# Patient Record
Sex: Female | Born: 1960 | Race: White | Hispanic: No | Smoking: Current every day smoker
Health system: Southern US, Community
[De-identification: ages and names within clinical notes are randomized; demographics above are authoritative.]

## PROBLEM LIST (undated history)

## (undated) DIAGNOSIS — F419 Anxiety disorder, unspecified: Secondary | ICD-10-CM

## (undated) DIAGNOSIS — I10 Essential (primary) hypertension: Secondary | ICD-10-CM

## (undated) DIAGNOSIS — F32A Depression, unspecified: Secondary | ICD-10-CM

## (undated) DIAGNOSIS — F329 Major depressive disorder, single episode, unspecified: Secondary | ICD-10-CM

## (undated) DIAGNOSIS — T7840XA Allergy, unspecified, initial encounter: Secondary | ICD-10-CM

## (undated) DIAGNOSIS — G35 Multiple sclerosis: Secondary | ICD-10-CM

## (undated) DIAGNOSIS — G709 Myoneural disorder, unspecified: Secondary | ICD-10-CM

## (undated) DIAGNOSIS — H539 Unspecified visual disturbance: Secondary | ICD-10-CM

## (undated) DIAGNOSIS — E785 Hyperlipidemia, unspecified: Secondary | ICD-10-CM

## (undated) HISTORY — DX: Allergy, unspecified, initial encounter: T78.40XA

## (undated) HISTORY — DX: Unspecified visual disturbance: H53.9

## (undated) HISTORY — DX: Major depressive disorder, single episode, unspecified: F32.9

## (undated) HISTORY — DX: Hyperlipidemia, unspecified: E78.5

## (undated) HISTORY — DX: Anxiety disorder, unspecified: F41.9

## (undated) HISTORY — PX: ABDOMINAL HYSTERECTOMY: SHX81

## (undated) HISTORY — DX: Myoneural disorder, unspecified: G70.9

## (undated) HISTORY — DX: Depression, unspecified: F32.A

## (undated) HISTORY — DX: Essential (primary) hypertension: I10

---

## 1997-04-03 HISTORY — PX: APPENDECTOMY: SHX54

## 1998-05-26 ENCOUNTER — Other Ambulatory Visit: Admission: RE | Admit: 1998-05-26 | Discharge: 1998-05-26 | Payer: Self-pay | Admitting: Obstetrics and Gynecology

## 1999-05-30 ENCOUNTER — Other Ambulatory Visit: Admission: RE | Admit: 1999-05-30 | Discharge: 1999-05-30 | Payer: Self-pay | Admitting: Obstetrics and Gynecology

## 2000-02-15 ENCOUNTER — Encounter: Payer: Self-pay | Admitting: Family Medicine

## 2000-02-15 ENCOUNTER — Encounter: Admission: RE | Admit: 2000-02-15 | Discharge: 2000-02-15 | Payer: Self-pay | Admitting: Family Medicine

## 2000-04-03 HISTORY — PX: TUBAL LIGATION: SHX77

## 2000-06-18 ENCOUNTER — Other Ambulatory Visit: Admission: RE | Admit: 2000-06-18 | Discharge: 2000-06-18 | Payer: Self-pay | Admitting: Obstetrics and Gynecology

## 2000-10-16 ENCOUNTER — Encounter: Payer: Self-pay | Admitting: Family Medicine

## 2000-10-16 ENCOUNTER — Encounter: Admission: RE | Admit: 2000-10-16 | Discharge: 2000-10-16 | Payer: Self-pay | Admitting: Family Medicine

## 2000-10-29 ENCOUNTER — Encounter: Payer: Self-pay | Admitting: Family Medicine

## 2000-10-29 ENCOUNTER — Encounter: Admission: RE | Admit: 2000-10-29 | Discharge: 2000-10-29 | Payer: Self-pay | Admitting: Family Medicine

## 2001-03-20 ENCOUNTER — Ambulatory Visit (HOSPITAL_COMMUNITY): Admission: RE | Admit: 2001-03-20 | Discharge: 2001-03-20 | Payer: Self-pay | Admitting: Obstetrics and Gynecology

## 2001-03-20 ENCOUNTER — Encounter (INDEPENDENT_AMBULATORY_CARE_PROVIDER_SITE_OTHER): Payer: Self-pay

## 2001-08-15 ENCOUNTER — Other Ambulatory Visit: Admission: RE | Admit: 2001-08-15 | Discharge: 2001-08-15 | Payer: Self-pay | Admitting: Obstetrics and Gynecology

## 2002-02-13 ENCOUNTER — Ambulatory Visit (HOSPITAL_COMMUNITY): Admission: RE | Admit: 2002-02-13 | Discharge: 2002-02-13 | Payer: Self-pay | Admitting: Obstetrics and Gynecology

## 2003-01-05 ENCOUNTER — Other Ambulatory Visit: Admission: RE | Admit: 2003-01-05 | Discharge: 2003-01-05 | Payer: Self-pay | Admitting: Obstetrics and Gynecology

## 2005-05-12 ENCOUNTER — Other Ambulatory Visit: Admission: RE | Admit: 2005-05-12 | Discharge: 2005-05-12 | Payer: Self-pay | Admitting: Obstetrics and Gynecology

## 2006-12-03 LAB — HM PAP SMEAR

## 2006-12-03 LAB — HM MAMMOGRAPHY: HM Mammogram: NORMAL

## 2007-03-04 HISTORY — PX: TOTAL VAGINAL HYSTERECTOMY: SHX2548

## 2007-03-13 ENCOUNTER — Ambulatory Visit (HOSPITAL_COMMUNITY): Admission: RE | Admit: 2007-03-13 | Discharge: 2007-03-14 | Payer: Self-pay | Admitting: Obstetrics and Gynecology

## 2007-03-13 ENCOUNTER — Encounter (INDEPENDENT_AMBULATORY_CARE_PROVIDER_SITE_OTHER): Payer: Self-pay | Admitting: Obstetrics and Gynecology

## 2007-09-25 ENCOUNTER — Ambulatory Visit: Payer: Self-pay | Admitting: Family Medicine

## 2007-09-25 DIAGNOSIS — I10 Essential (primary) hypertension: Secondary | ICD-10-CM | POA: Insufficient documentation

## 2007-09-25 DIAGNOSIS — E785 Hyperlipidemia, unspecified: Secondary | ICD-10-CM | POA: Insufficient documentation

## 2007-09-25 DIAGNOSIS — F339 Major depressive disorder, recurrent, unspecified: Secondary | ICD-10-CM | POA: Insufficient documentation

## 2007-09-26 LAB — CONVERTED CEMR LAB
ALT: 32 units/L (ref 0–35)
AST: 28 units/L (ref 0–37)
Albumin: 4.6 g/dL (ref 3.5–5.2)
Alkaline Phosphatase: 58 units/L (ref 39–117)
BUN: 12 mg/dL (ref 6–23)
CO2: 23 meq/L (ref 19–32)
Calcium: 9.6 mg/dL (ref 8.4–10.5)
Chloride: 102 meq/L (ref 96–112)
Cholesterol: 352 mg/dL — ABNORMAL HIGH (ref 0–200)
Creatinine, Ser: 0.76 mg/dL (ref 0.40–1.20)
Glucose, Bld: 97 mg/dL (ref 70–99)
HDL: 44 mg/dL (ref >39)
LDL Cholesterol: 262 mg/dL — ABNORMAL HIGH (ref 0–99)
Potassium: 4.6 meq/L (ref 3.5–5.3)
Sodium: 139 meq/L (ref 135–145)
Total Bilirubin: 0.4 mg/dL (ref 0.3–1.2)
Total CHOL/HDL Ratio: 8
Total Protein: 7.5 g/dL (ref 6.0–8.3)
Triglycerides: 232 mg/dL — ABNORMAL HIGH (ref <150)
VLDL: 46 mg/dL — ABNORMAL HIGH (ref 0–40)

## 2007-09-27 ENCOUNTER — Encounter: Payer: Self-pay | Admitting: Family Medicine

## 2007-11-11 ENCOUNTER — Telehealth: Payer: Self-pay | Admitting: Family Medicine

## 2007-11-22 DIAGNOSIS — G35D Multiple sclerosis, unspecified: Secondary | ICD-10-CM | POA: Insufficient documentation

## 2007-11-22 DIAGNOSIS — G35 Multiple sclerosis: Secondary | ICD-10-CM | POA: Insufficient documentation

## 2008-03-03 ENCOUNTER — Ambulatory Visit (HOSPITAL_COMMUNITY): Admission: RE | Admit: 2008-03-03 | Discharge: 2008-03-03 | Payer: Self-pay | Admitting: Obstetrics and Gynecology

## 2008-03-03 HISTORY — PX: ABDOMINAL HYSTERECTOMY: SHX81

## 2009-04-16 ENCOUNTER — Ambulatory Visit: Payer: Self-pay | Admitting: Family Medicine

## 2009-05-18 ENCOUNTER — Encounter: Payer: Self-pay | Admitting: Family Medicine

## 2009-05-20 LAB — CONVERTED CEMR LAB
ALT: 17 units/L (ref 0–35)
AST: 13 units/L (ref 0–37)
Albumin: 4.7 g/dL (ref 3.5–5.2)
Alkaline Phosphatase: 60 units/L (ref 39–117)
BUN: 17 mg/dL (ref 6–23)
CO2: 27 meq/L (ref 19–32)
Calcium: 9.8 mg/dL (ref 8.4–10.5)
Chloride: 102 meq/L (ref 96–112)
Cholesterol: 315 mg/dL — ABNORMAL HIGH (ref 0–200)
Creatinine, Ser: 0.62 mg/dL (ref 0.40–1.20)
Glucose, Bld: 116 mg/dL — ABNORMAL HIGH (ref 70–99)
HDL: 41 mg/dL (ref >39)
LDL Cholesterol: 239 mg/dL — ABNORMAL HIGH (ref 0–99)
Potassium: 4.3 meq/L (ref 3.5–5.3)
Sodium: 140 meq/L (ref 135–145)
TSH: 1.695 microintl units/mL (ref 0.350–4.500)
Total Bilirubin: 0.6 mg/dL (ref 0.3–1.2)
Total CHOL/HDL Ratio: 7.7
Total Protein: 7.5 g/dL (ref 6.0–8.3)
Triglycerides: 175 mg/dL — ABNORMAL HIGH (ref <150)
VLDL: 35 mg/dL (ref 0–40)

## 2009-05-21 ENCOUNTER — Ambulatory Visit: Payer: Self-pay | Admitting: Family Medicine

## 2009-05-21 DIAGNOSIS — E1129 Type 2 diabetes mellitus with other diabetic kidney complication: Secondary | ICD-10-CM | POA: Insufficient documentation

## 2009-05-21 DIAGNOSIS — R7301 Impaired fasting glucose: Secondary | ICD-10-CM | POA: Insufficient documentation

## 2009-05-21 DIAGNOSIS — E118 Type 2 diabetes mellitus with unspecified complications: Secondary | ICD-10-CM | POA: Insufficient documentation

## 2009-12-22 ENCOUNTER — Encounter: Payer: Self-pay | Admitting: Family Medicine

## 2009-12-23 ENCOUNTER — Ambulatory Visit: Payer: Self-pay | Admitting: Family Medicine

## 2009-12-23 ENCOUNTER — Telehealth: Payer: Self-pay | Admitting: Family Medicine

## 2009-12-23 DIAGNOSIS — D510 Vitamin B12 deficiency anemia due to intrinsic factor deficiency: Secondary | ICD-10-CM | POA: Insufficient documentation

## 2009-12-23 LAB — CONVERTED CEMR LAB

## 2010-04-05 ENCOUNTER — Telehealth: Payer: Self-pay | Admitting: Family Medicine

## 2010-04-06 ENCOUNTER — Encounter: Payer: Self-pay | Admitting: Family Medicine

## 2010-04-06 LAB — CONVERTED CEMR LAB: Vitamin B-12: 208 pg/mL — ABNORMAL LOW (ref 211–911)

## 2010-04-07 ENCOUNTER — Ambulatory Visit
Admission: RE | Admit: 2010-04-07 | Discharge: 2010-04-07 | Payer: Self-pay | Source: Home / Self Care | Attending: Family Medicine | Admitting: Family Medicine

## 2010-04-14 ENCOUNTER — Ambulatory Visit
Admission: RE | Admit: 2010-04-14 | Discharge: 2010-04-14 | Payer: Self-pay | Source: Home / Self Care | Attending: Family Medicine | Admitting: Family Medicine

## 2010-04-21 ENCOUNTER — Ambulatory Visit
Admission: RE | Admit: 2010-04-21 | Discharge: 2010-04-21 | Payer: Self-pay | Source: Home / Self Care | Attending: Family Medicine | Admitting: Family Medicine

## 2010-04-28 ENCOUNTER — Ambulatory Visit
Admission: RE | Admit: 2010-04-28 | Discharge: 2010-04-28 | Payer: Self-pay | Source: Home / Self Care | Attending: Family Medicine | Admitting: Family Medicine

## 2010-05-05 NOTE — Assessment & Plan Note (Signed)
Summary: Pernicious anemia, HTN, cholesterol.    Vital Signs:  Patient profile:   50 year old female Height:      66.5 inches Weight:      174 pounds Pulse rate:   90 / minute BP sitting:   130 / 82  (right arm) Cuff size:   regular  Vitals Entered By: Avon Gully CMA, Duncan Dull) (December 23, 2009 1:51 PM) CC: neuro drew labs and b-12 was low, discuss getting B-12   Primary Care Provider:  Nani Gasser MD  CC:  neuro drew labs and b-12 was low and discuss getting B-12.  History of Present Illness: neuro drew labs and b-12 was low, discuss getting B-12. Has lost over 30 lbs.  Has been exercising and eating a low carb diet. She has MS and B12 leve under 200. They would like to get her up to 800-900.  Seh is having her records faxed over. Has had some fatigues and numbness in the extremities.    Not on her statin due ot rehceck. She has lost 30 lbs so far and would like to lose 20 lb more so this should help. Says she stopped the statin because feels it was making it more difficult to lose weight. NO myalgias on the medication.   Current Medications (verified): 1)  Lisinopril-Hydrochlorothiazide 20-12.5 Mg  Tabs (Lisinopril-Hydrochlorothiazide) .... Take One Tablet By Mouth Once A Day 2)  Copaxone 20 Mg/ml Kit (Glatiramer Acetate)  Allergies (verified): 1)  ! Pcn  Comments:  Nurse/Medical Assistant: The patient's medications and allergies were reviewed with the patient and were updated in the Medication and Allergy Lists. 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  Married to [employer] and her son  and mother live at home as well.  Current Smoker Alcohol use-no Drug use-no Regular exercise-yes  Past Medical History: Current Problems:  DEPRESSION (ICD-311) HYPERTENSION, BENIGN (ICD-401.1) HYPERLIPIDEMIA (ICD-272.4)  MS - Sees Dr. Neva Seat, Neurology at [facility].   Contraindications/Deferment of Procedures/Staging:    Test/Procedure: PSA    Reason for deferment: not indicated   Physical Exam  General:  Well-developed,well-nourished,in no acute distress; alert,appropriate and cooperative throughout examination Lungs:  Normal respiratory effort, chest expands symmetrically. Lungs are clear to auscultation, no crackles or wheezes. Heart:  Normal rate and regular rhythm. S1 and S2 normal without gallop, murmur, click, rub or other extra sounds. Extremities:  NO LE edema.  Skin:  no rashes.     Impression & Recommendations:  Problem # 1:  ANEMIA, PERNICIOUS (ICD-281.0) Assessment New Can give IM once daily x 1 week and then once a week for 2 months and then recheck levels and hopefully go to once a month  Given first injection today and can follow up with the nurse to demo that she can give it to her self adn then can self admin the shots.  Orders: Admin of Therapeutic Inj (IM or [place]) ([address]) Vit B12 1000 mcg (U0454)  Her updated medication list for this problem includes:    Cyanocobalamin 1000 Mcg/ml Soln (Cyanocobalamin) ..... Inject 1ml every week.  Problem # 2:  HYPERLIPIDEMIA (ICD-272.4) Not on her statin due ot rehceck. She has lost 30 lbs so far and would like to lose 20 lb more so this should help. Says she stopped the statin because feels it was making it more difficult to lose weight.  The following medications were removed from  the medication list:    Lipitor 80 Mg Tabs (Atorvastatin calcium) .Marland Kitchen... Take 1 tablet by mouth once a day at bedtime  Orders: T-Lipid Profile ([id number])  Problem # 3:  HYPERTENSION, BENIGN (ICD-401.1) Looks good today.  Her updated medication list for this problem includes:    Lisinopril-hydrochlorothiazide 20-12.5 Mg Tabs (Lisinopril-hydrochlorothiazide) .Marland Kitchen... Take one tablet by mouth once a day  Orders: T-Basic Metabolic Panel [phone])  BP today: 130/82 Prior BP:  126/83 (05/21/2009)  Prior 10 Yr Risk Heart Disease: 11 % (05/21/2009)  Labs Reviewed: K+: 4.3 (05/18/2009) Creat: : 0.62 (05/18/2009)   Chol: 315 (05/18/2009)   HDL: 41 (05/18/2009)   LDL: 239 (05/18/2009)   TG: 175 (05/18/2009)  Complete Medication List: 1)  Lisinopril-hydrochlorothiazide 20-12.5 Mg Tabs (Lisinopril-hydrochlorothiazide) .... Take one tablet by mouth once a day 2)  Copaxone 20 Mg/ml Kit (Glatiramer acetate) 3)  Cyanocobalamin 1000 Mcg/ml Soln (Cyanocobalamin) .... Inject 1ml every week.  Patient Instructions: 1)  Pick up B12 vial and bring with for nurse appt next week to give to self.   2)  Go for labs fasting in the next 6 months.  Prescriptions: CYANOCOBALAMIN 1000 MCG/ML SOLN (CYANOCOBALAMIN) Inject 1ml every week.  #10 ml x 0   Entered and Authorized by:   Nani Gasser MD   Signed by:   Nani Gasser MD on 12/23/2009   Method used:   Electronically to        [facility] [phone]* (retail)       [address]       [place], [place]  [address]       Ph: [phone] or [phone]       Fax: [phone]   RxID:   [id number]    Medication Administration  Injection # 1:    Medication: Vit B12 1000 mcg    Diagnosis: ANEMIA, PERNICIOUS (ICD-281.0)    Route: IM    Site: L deltoid    Exp Date: 08/02/2011    Lot #: [id number]    Mfr: [employer]    Patient tolerated injection without complications    Given by: Avon Gully CMA, Duncan Dull) (December 23, 2009 2:28 PM)  Orders Added: 1)  T-Lipid Profile [phone] 2)  T-Basic Metabolic Panel [phone] 3)  Admin of Therapeutic Inj (IM or [place]) [[address]] 4)  Est. Patient Level IV [70623] 5)  Vit B12 1000 mcg [J3420]

## 2010-05-05 NOTE — Assessment & Plan Note (Signed)
Summary: B12   Nurse Visit   Allergies: 1)  ! Pcn  Medication Administration  Injection # 1:    Medication: Vit B12 1000 mcg    Diagnosis: ANEMIA, PERNICIOUS (ICD-281.0)    Route: IM    Site: L deltoid    Exp Date: 01/02/2012    Lot #: [id number]    Mfr: [employer]    Patient tolerated injection without complications    Given by: Avon Gully CMA, Duncan Dull) (April 28, 2010 3:32 PM)  Orders Added: 1)  Vit B12 1000 mcg [J3420] 2)  Admin of Injection (IM/SQ) [47829]

## 2010-05-05 NOTE — Assessment & Plan Note (Signed)
Summary: HTN, lipds, glucose   Vital Signs:  Patient profile:   50 year old female Height:      66.5 inches Weight:      204 pounds Pulse rate:   90 / minute BP sitting:   126 / 83  (left arm) Cuff size:   large  Vitals Entered By: Kathlene November (May 21, 2009 10:04 AM) CC: followup BP and go over labs, Hypertension Management   Primary Care Provider:  Nani Gasser MD  CC:  followup BP and go over labs and Hypertension Management.  History of Present Illness: Has lost 4 pounds in teh last month. Has been on the [employer]. She is here to go over her lab results. She did end of filling the pravastatin instead of the lipitor because of cost.    Hypertension History:      She denies headache, chest pain, palpitations, dyspnea with exertion, orthopnea, PND, peripheral edema, visual symptoms, neurologic problems, syncope, and side effects from treatment.  She notes no problems with any antihypertensive medication side effects.        Positive major cardiovascular risk factors include hyperlipidemia, hypertension, and current tobacco user.  Negative major cardiovascular risk factors include female age less than 2 years old and negative family history for ischemic heart disease.    Current Medications (verified): 1)  Lisinopril-Hydrochlorothiazide 20-12.5 Mg  Tabs (Lisinopril-Hydrochlorothiazide) .... Take One Tablet By Mouth Once A Day 2)  Pravachol 40 Mg Tabs (Pravastatin Sodium) .... Take One Tablet By Mouth Once A Day  Allergies (verified): 1)  ! Pcn  Comments:  Nurse/Medical Assistant: The patient's medications and allergies were reviewed with the patient and were updated in the Medication and Allergy Lists. Kathlene November (May 21, 2009 10:05 AM)  Physical Exam  General:  Well-developed,well-nourished,in no acute distress; alert,appropriate and cooperative throughout examination Lungs:  Normal respiratory effort, chest expands symmetrically. Lungs are clear to  auscultation, no crackles or wheezes. Heart:  Normal rate and regular rhythm. S1 and S2 normal without gallop, murmur, click, rub or other extra sounds. Skin:  no rashes.   Psych:  Cognition and judgment appear intact. Alert and cooperative with normal attention span and concentration. No apparent delusions, illusions, hallucinations   Impression & Recommendations:  Problem # 1:  HYPERLIPIDEMIA (ICD-272.4) Also work on diet and exercise. Will need to change to lipitor for better control. Recheck in 1 mo on teh lipitor.  Call if any SE or problems.  Her updated medication list for this problem includes:    Lipitor 80 Mg Tabs (Atorvastatin calcium) .Marland Kitchen... Take 1 tablet by mouth once a day at bedtime  Problem # 2:  HYPERTENSION, BENIGN (ICD-401.1) Looks much better today. Continue to work on the weight loss. Congratulated her on her 4lb of weight loss.  Her updated medication list for this problem includes:    Lisinopril-hydrochlorothiazide 20-12.5 Mg Tabs (Lisinopril-hydrochlorothiazide) .Marland Kitchen... Take one tablet by mouth once a day  BP today: 126/83 Prior BP: 149/91 (04/16/2009)  10 Yr Risk Heart Disease: 11 % Prior 10 Yr Risk Heart Disease: 15 % (04/16/2009)  Labs Reviewed: K+: 4.3 (05/18/2009) Creat: : 0.62 (05/18/2009)   Chol: 315 (05/18/2009)   HDL: 41 (05/18/2009)   LDL: 239 (05/18/2009)   TG: 175 (05/18/2009)  Problem # 3:  IMPAIRED FASTING GLUCOSE (ICD-790.21) Will recheck fasting in 1 month.   Complete Medication List: 1)  Lisinopril-hydrochlorothiazide 20-12.5 Mg Tabs (Lisinopril-hydrochlorothiazide) .... Take one tablet by mouth once a day 2)  Lipitor 80 Mg  Tabs (Atorvastatin calcium) .... Take 1 tablet by mouth once a day at bedtime  Hypertension Assessment/Plan:      The patient's hypertensive risk group is category B: At least one risk factor (excluding diabetes) with no target organ damage.  Her calculated 10 year risk of coronary heart disease is 11 %.  Today's blood  pressure is 126/83.  Her blood pressure goal is < 140/90.  Prescriptions: LIPITOR 80 MG TABS (ATORVASTATIN CALCIUM) Take 1 tablet by mouth once a day at bedtime  #30 x 2   Entered and Authorized by:   Nani Gasser MD   Signed by:   Nani Gasser MD on 05/21/2009   Method used:   Electronically to        [facility] [phone]* (retail)       [address] [place], [place]  [address]       Ph: [phone]       Fax: [phone]   RxID:   [id number]

## 2010-05-05 NOTE — Assessment & Plan Note (Signed)
Summary: FU HTN, Lipids   Vital Signs:  Patient profile:   50 year old female Height:      66.5 inches Weight:      208 pounds BMI:     33.19 Pulse rate:   76 / minute BP sitting:   149 / 91  (left arm) Cuff size:   large  Vitals Entered By: Kathlene November (April 16, 2009 8:51 AM) CC: stopped all meds a few months ago and now wants to start them back, Hypertension Management   CC:  stopped all meds a few months ago and now wants to start them back and Hypertension Management.  History of Present Illness: stopped all meds a few months ago and now wants to start them back. Says got sick of taking everything so stopped all her meds. After Chirsm[place] her mom had a stroke and this scared her.  Started and [employer] since Thanksgiving and has lost about 15 lbs.    Hypertension History:      She denies headache, chest pain, palpitations, dyspnea with exertion, orthopnea, PND, peripheral edema, visual symptoms, neurologic problems, syncope, and side effects from treatment.  Not taking meds. .        Positive major cardiovascular risk factors include hyperlipidemia, hypertension, and current tobacco user.  Negative major cardiovascular risk factors include female age less than 13 years old and negative family history for ischemic heart disease.     Current Medications (verified): 1)  Prozac 40 Mg  Caps (Fluoxetine Hcl) .... Take One Capsule By Mouth Once A Day 2)  Copaxone 20 Mg/ml  Kit (Glatiramer Acetate) .... Use One Injection Daily For Ms 3)  Lisinopril-Hydrochlorothiazide 20-12.5 Mg  Tabs (Lisinopril-Hydrochlorothiazide) .... Take One Tablet By Mouth Once A Day 4)  Pravastatin Sodium 40 Mg  Tabs (Pravastatin Sodium) .... Take 1 Tablet By Mouth Once A Day At Bedtime  Allergies (verified): 1)  ! Pcn  Comments:  Nurse/Medical Assistant: The patient's medications and allergies were reviewed with the patient and were updated in the Medication and Allergy Lists. Kathlene November (April 16, 2009 8:52 AM)  Physical Exam  General:  Well-developed,well-nourished,in no acute distress; alert,appropriate and cooperative throughout examination Head:  Normocephalic and atraumatic without obvious abnormalities. No apparent alopecia or balding. Lungs:  Normal respiratory effort, chest expands symmetrically. Lungs are clear to auscultation, no crackles or wheezes. Heart:  Normal rate and regular rhythm. S1 and S2 normal without gallop, murmur, click, rub or other extra sounds. Skin:  no rashes.   Psych:  Cognition and judgment appear intact. Alert and cooperative with normal attention span and concentration. No apparent delusions, illusions, hallucinations   Impression & Recommendations:  Problem # 1:  HYPERTENSION, BENIGN (ICD-401.1) Assessment Deteriorated Discussed importance of treating HTN. did well on the combo below so will restart. Due for labs but will check in one month once back on her statin.  Her updated medication list for this problem includes:    Lisinopril-hydrochlorothiazide 20-12.5 Mg Tabs (Lisinopril-hydrochlorothiazide) .Marland Kitchen... Take one tablet by mouth once a day  Orders: T-Comprehensive Metabolic Panel [phone]) T-Lipid Profile ([id number]) T-TSH [phone])  Problem # 2:  HYPERLIPIDEMIA (ICD-272.4) Assessment: Deteriorated REstart stain. Change to lipitor and her LDL is so high she will likely needs something stronger.  Recheck labs in one months.  Her updated medication list for this problem includes:    Lipitor 80 Mg Tabs (Atorvastatin calcium) .Marland Kitchen... Take 1 tablet by mouth once a day at bedtime  Orders: T-Comprehensive Metabolic Panel [phone])  T-Lipid Profile [phone])  Complete Medication List: 1)  Fluoxetine Hcl 20 Mg Caps (Fluoxetine hcl) .... Take 1 tablet by mouth once a day 2)  Copaxone 20 Mg/ml Kit (Glatiramer acetate) .... Use one injection daily for ms 3)  Lisinopril-hydrochlorothiazide 20-12.5 Mg Tabs  (Lisinopril-hydrochlorothiazide) .... Take one tablet by mouth once a day 4)  Lipitor 80 Mg Tabs (Atorvastatin calcium) .... Take 1 tablet by mouth once a day at bedtime  Hypertension Assessment/Plan:      The patient's hypertensive risk group is category B: At least one risk factor (excluding diabetes) with no target organ damage.  Her calculated 10 year risk of coronary heart disease is 15 %.  Today's blood pressure is 149/91.   Prescriptions: LIPITOR 80 MG TABS (ATORVASTATIN CALCIUM) Take 1 tablet by mouth once a day at bedtime  #30 x 1   Entered and Authorized by:   Nani Gasser MD   Signed by:   Nani Gasser MD on 04/16/2009   Method used:   Electronically to        [facility] [phone]* (retail)       [address] [place], [place]  [address]       Ph: [phone]       Fax: [phone]   RxID:   [id number] FLUOXETINE HCL 20 MG CAPS (FLUOXETINE HCL) Take 1 tablet by mouth once a day  #90 x 0   Entered and Authorized by:   Nani Gasser MD   Signed by:   Nani Gasser MD on 04/16/2009   Method used:   Electronically to        [facility] [phone]* (retail)       [address] [place], [place]  [address]       Ph: [phone]       Fax: [phone]   RxID:   [id number] PRAVASTATIN SODIUM 40 MG  TABS (PRAVASTATIN SODIUM) Take 1 tablet by mouth once a day at bedtime  #30 x 1   Entered and Authorized by:   Nani Gasser MD   Signed by:   Nani Gasser MD on 04/16/2009   Method used:   Electronically to        [facility] [phone]* (retail)       [address] [place], [place]  [address]       Ph: [phone]       Fax: [phone]   RxID:   [id number] LISINOPRIL-HYDROCHLOROTHIAZIDE 20-12.5 MG  TABS (LISINOPRIL-HYDROCHLOROTHIAZIDE) Take one tablet by mouth once a day  #90 x 1   Entered and Authorized by:   Nani Gasser MD   Signed by:   Nani Gasser MD on 04/16/2009   Method used:   Electronically to          [facility] [phone]* (retail)       [address] [place], [place]  [address]       Ph: [phone]       Fax: [phone]   RxID:   [id number]   Flu Vaccine Next Due:  Refused Last PAP:  normal (12/03/2006 5:31:28 PM) PAP Next Due:  Not Indicated Last Mammogram:  normal (12/03/2006 5:31:28 PM) Mammogram Next Due:  Refused

## 2010-05-05 NOTE — Assessment & Plan Note (Signed)
Summary: B12  Nurse Visit   Vitals Entered By: Payton Spark CMA (April 14, 2010 3:38 PM)  Allergies: 1)  ! Pcn  Medication Administration  Injection # 1:    Medication: Vit B12 1000 mcg    Diagnosis: ANEMIA, PERNICIOUS (ICD-281.0)    Route: IM    Site: L deltoid    Exp Date: 01/2012    Lot #: [id number]    Patient tolerated injection without complications    Given by: Payton Spark CMA (April 14, 2010 3:38 PM)  Orders Added: 1)  Vit B12 1000 mcg [J3420] 2)  Admin of Therapeutic Inj  intramuscular or subcutaneous [96372]   Medication Administration  Injection # 1:    Medication: Vit B12 1000 mcg    Diagnosis: ANEMIA, PERNICIOUS (ICD-281.0)    Route: IM    Site: L deltoid    Exp Date: 01/2012    Lot #: [id number]    Patient tolerated injection without complications    Given by: Payton Spark CMA (April 14, 2010 3:38 PM)  Orders Added: 1)  Vit B12 1000 mcg [J3420] 2)  Admin of Therapeutic Inj  intramuscular or subcutaneous [[address]191]

## 2010-05-05 NOTE — Progress Notes (Signed)
Summary: Vitamin B12 level needs checked  Phone Note Call from Patient Call back at Home Phone [phone]   Caller: Patient Call For: Nani Gasser MD Summary of Call: Pt was on Vitamin B12 injections by her neurologists but hasn't taken any for 2 months now. Needs to have level rechecked and wanted to know if needs appointment or can you just send lab slip down to lab to have drawn Initial call taken by: Kathlene November LPN,  April 05, 2010 9:59 AM  Follow-up for Phone Call        We can send lab slip and then can make sure appt to given injection if wouldlike  Follow-up by: Nani Gasser MD,  April 05, 2010 10:20 AM

## 2010-05-05 NOTE — Assessment & Plan Note (Signed)
Summary: B12 injection   Nurse Visit   Allergies: 1)  ! Pcn  Medication Administration  Injection # 1:    Medication: Vit B12 1000 mcg    Diagnosis: ANEMIA, PERNICIOUS (ICD-281.0)    Route: IM    Site: R deltoid    Exp Date: 04/04/2011    Lot #: [id number]    Mfr: [employer]    Patient tolerated injection without complications    Given by: Avon Gully CMA, Duncan Dull) (April 07, 2010 11:30 AM)  Orders Added: 1)  Vit B12 1000 mcg [J3420] 2)  Admin of Injection (IM/SQ) [[address]]

## 2010-05-05 NOTE — Assessment & Plan Note (Signed)
Summary: B12   Nurse Visit   Vitals Entered By: Payton Spark CMA (April 21, 2010 3:32 PM)  Allergies: 1)  ! Pcn  Medication Administration  Injection # 1:    Medication: Vit B12 1000 mcg    Diagnosis: ANEMIA, PERNICIOUS (ICD-281.0)    Route: IM    Site: L deltoid    Exp Date: 01/2012    Lot #: [id number]    Patient tolerated injection without complications    Given by: Payton Spark CMA (April 21, 2010 3:33 PM)  Orders Added: 1)  Vit B12 1000 mcg [J3420] 2)  Admin of Therapeutic Inj  intramuscular or subcutaneous [[address]]   Medication Administration  Injection # 1:    Medication: Vit B12 1000 mcg    Diagnosis: ANEMIA, PERNICIOUS (ICD-281.0)    Route: IM    Site: L deltoid    Exp Date: 01/2012    Lot #: [id number]    Patient tolerated injection without complications    Given by: Payton Spark CMA (April 21, 2010 3:33 PM)  Orders Added: 1)  Vit B12 1000 mcg [J3420] 2)  Admin of Therapeutic Inj  intramuscular or subcutaneous [[address]]

## 2010-05-05 NOTE — Progress Notes (Signed)
Summary: Vitamin B12  Phone Note Call from Patient Call back at Home Phone [phone]   Caller: Patient Call For: Nani Gasser MD Summary of Call: Pt calls and states you were supposed to send itamin B12 for injections to her pharamcy and it is not there Initial call taken by: Kathlene November,  December 23, 2009 4:40 PM  Follow-up for Phone Call        Will  send.  Follow-up by: Nani Gasser MD,  December 23, 2009 5:05 PM

## 2010-05-13 ENCOUNTER — Encounter: Payer: Self-pay | Admitting: Family Medicine

## 2010-05-16 ENCOUNTER — Ambulatory Visit (INDEPENDENT_AMBULATORY_CARE_PROVIDER_SITE_OTHER): Payer: BC Managed Care – PPO

## 2010-05-16 ENCOUNTER — Encounter: Payer: Self-pay | Admitting: Family Medicine

## 2010-05-16 ENCOUNTER — Ambulatory Visit: Payer: Self-pay

## 2010-05-16 DIAGNOSIS — D510 Vitamin B12 deficiency anemia due to intrinsic factor deficiency: Secondary | ICD-10-CM

## 2010-05-19 ENCOUNTER — Encounter: Payer: Self-pay | Admitting: Family Medicine

## 2010-05-25 NOTE — Assessment & Plan Note (Signed)
Summary: B12 shot  Nurse Visit   Vitals Entered By: Payton Spark CMA (May 16, 2010 4:05 PM)  Allergies: 1)  ! Pcn  Medication Administration  Injection # 1:    Medication: Vit B12 1000 mcg    Diagnosis: ANEMIA, PERNICIOUS (ICD-281.0)    Route: IM    Site: R deltoid    Exp Date: 01/2012    Lot #: [id number]    Patient tolerated injection without complications    Given by: Payton Spark CMA (May 16, 2010 4:05 PM)  Orders Added: 1)  Vit B12 1000 mcg [J3420] 2)  Admin of Therapeutic Inj  intramuscular or subcutaneous [96[address]]   Medication Administration  Injection # 1:    Medication: Vit B12 1000 mcg    Diagnosis: ANEMIA, PERNICIOUS (ICD-281.0)    Route: IM    Site: R deltoid    Exp Date: 01/2012    Lot #: [id number]    Patient tolerated injection without complications    Given by: Payton Spark CMA (May 16, 2010 4:05 PM)  Orders Added: 1)  Vit B12 1000 mcg [J3420] 2)  Admin of Therapeutic Inj  intramuscular or subcutaneous [04540]

## 2010-05-25 NOTE — Miscellaneous (Signed)
Summary: Colonoscopy  Clinical Lists Changes  Observations: Added new observation of COLONNXTDUE: 05/2020 (05/19/2010 13:03) Added new observation of COLONOSCOPY: normal (05/13/2010 13:04)      Preventive Care Screening  Colonoscopy:    Date:  05/13/2010    Next Due:  05/2020    Results:  normal

## 2010-05-30 ENCOUNTER — Encounter: Payer: Self-pay | Admitting: Family Medicine

## 2010-05-30 ENCOUNTER — Ambulatory Visit (INDEPENDENT_AMBULATORY_CARE_PROVIDER_SITE_OTHER): Payer: BC Managed Care – PPO

## 2010-05-30 ENCOUNTER — Ambulatory Visit: Payer: Self-pay

## 2010-05-30 DIAGNOSIS — D510 Vitamin B12 deficiency anemia due to intrinsic factor deficiency: Secondary | ICD-10-CM

## 2010-05-30 DIAGNOSIS — R7301 Impaired fasting glucose: Secondary | ICD-10-CM

## 2010-05-30 DIAGNOSIS — E785 Hyperlipidemia, unspecified: Secondary | ICD-10-CM

## 2010-06-09 NOTE — Procedures (Signed)
Summary: Colonoscopy/[facility]  Colonoscopy/[facility]   Imported By: Lanelle Bal 06/02/2010 14:10:50  _____________________________________________________________________  External Attachment:    Type:   Image     Comment:   External Document

## 2010-06-09 NOTE — Assessment & Plan Note (Signed)
Summary: B12 shots  Nurse Visit   Vitals Entered By: Payton Spark CMA (May 30, 2010 3:55 PM)  Allergies: 1)  ! Pcn  Medication Administration  Injection # 1:    Medication: Vit B12 1000 mcg    Diagnosis: ANEMIA, PERNICIOUS (ICD-281.0)    Route: IM    Site: R deltoid    Exp Date: 01/2012    Lot #: [id number]    Patient tolerated injection without complications    Given by: Payton Spark CMA (May 30, 2010 3:56 PM)  Orders Added: 1)  Vit B12 1000 mcg [J3420] 2)  Admin of Therapeutic Inj  intramuscular or subcutaneous [96[address]]   Medication Administration  Injection # 1:    Medication: Vit B12 1000 mcg    Diagnosis: ANEMIA, PERNICIOUS (ICD-281.0)    Route: IM    Site: R deltoid    Exp Date: 01/2012    Lot #: [id number]    Patient tolerated injection without complications    Given by: Payton Spark CMA (May 30, 2010 3:56 PM)  Orders Added: 1)  Vit B12 1000 mcg [J3420] 2)  Admin of Therapeutic Inj  intramuscular or subcutaneous [[id number]]

## 2010-06-20 ENCOUNTER — Telehealth: Payer: Self-pay | Admitting: Family Medicine

## 2010-06-22 ENCOUNTER — Other Ambulatory Visit: Payer: Self-pay | Admitting: *Deleted

## 2010-06-22 DIAGNOSIS — E785 Hyperlipidemia, unspecified: Secondary | ICD-10-CM

## 2010-06-22 DIAGNOSIS — R7301 Impaired fasting glucose: Secondary | ICD-10-CM

## 2010-06-24 ENCOUNTER — Other Ambulatory Visit: Payer: Self-pay | Admitting: Family Medicine

## 2010-06-24 DIAGNOSIS — E785 Hyperlipidemia, unspecified: Secondary | ICD-10-CM

## 2010-06-25 ENCOUNTER — Other Ambulatory Visit: Payer: Self-pay | Admitting: Family Medicine

## 2010-06-25 LAB — BASIC METABOLIC PANEL
CO2: 25 mEq/L (ref 19–32)
Calcium: 10 mg/dL (ref 8.4–10.5)
Glucose, Bld: 92 mg/dL (ref 70–99)
Potassium: 4.5 mEq/L (ref 3.5–5.3)
Sodium: 143 mEq/L (ref 135–145)

## 2010-06-25 LAB — LIPID PANEL
Cholesterol: 233 mg/dL — ABNORMAL HIGH (ref 0–200)
HDL: 45 mg/dL (ref >39)
Total CHOL/HDL Ratio: 5.2 Ratio
VLDL: 26 mg/dL (ref 0–40)

## 2010-06-25 NOTE — Telephone Encounter (Signed)
Call pt: LDL chol is still high.  How long has she been on the lipitor 80mg  1/2 tab.  We may need to go ahead and change her to crestor since she didn't tolerate the higher dose of lipitor. Will send new rx . Please enter pharm and send.

## 2010-06-28 MED ORDER — ROSUVASTATIN CALCIUM 20 MG PO TABS: 20.0000 mg | ORAL_TABLET | Freq: Every day | ORAL | Status: DC

## 2010-06-28 NOTE — Progress Notes (Signed)
Pt.notified

## 2010-06-28 NOTE — Telephone Encounter (Signed)
Pt notified and rx has been sent to [facility] in [place]

## 2010-06-30 NOTE — Progress Notes (Signed)
Summary: Lipitor   Phone Note Call from Patient   Caller: Patient Reason for Call: Insurance Question Summary of Call: FYI Pt LMOM stating she started taking Lipitor 80mg  as directed but it is causing diarrhea so Pt has cut dose in half. Initial call taken by: Payton Spark CMA,  June 20, 2010 4:12 PM  Follow-up for Phone Call        Note pt hasn't taken med in 6 months but re-started it.  Follow-up by: Nani Gasser MD,  June 20, 2010 4:36 PM    New/Updated Medications: LIPITOR 80 MG TABS (ATORVASTATIN CALCIUM) 1/2 tab daily by mouth

## 2010-07-03 ENCOUNTER — Encounter: Payer: Self-pay | Admitting: Family Medicine

## 2010-07-07 ENCOUNTER — Encounter: Payer: Self-pay | Admitting: Family Medicine

## 2010-07-07 ENCOUNTER — Ambulatory Visit (INDEPENDENT_AMBULATORY_CARE_PROVIDER_SITE_OTHER): Payer: 59 | Admitting: Family Medicine

## 2010-07-07 DIAGNOSIS — D510 Vitamin B12 deficiency anemia due to intrinsic factor deficiency: Secondary | ICD-10-CM

## 2010-07-07 DIAGNOSIS — I10 Essential (primary) hypertension: Secondary | ICD-10-CM

## 2010-07-07 DIAGNOSIS — E785 Hyperlipidemia, unspecified: Secondary | ICD-10-CM

## 2010-07-07 DIAGNOSIS — R7301 Impaired fasting glucose: Secondary | ICD-10-CM

## 2010-07-07 MED ORDER — VALACYCLOVIR HCL 500 MG PO TABS: 500.0000 mg | ORAL_TABLET | Freq: Two times a day (BID) | ORAL | Status: DC

## 2010-07-07 NOTE — Assessment & Plan Note (Signed)
Recheck level. 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

## 2010-07-07 NOTE — Assessment & Plan Note (Signed)
Started the crestor yesterday.  Recheck lipids and Hepatic panel in 6-8 weeks. Monitor for SE, which may be compliciated by her hx of MS.

## 2010-07-07 NOTE — Assessment & Plan Note (Signed)
Last glucose was at goal.  LIkely bc of her wt loss.

## 2010-07-07 NOTE — Progress Notes (Signed)
  Subjective:    Patient ID: Stephanie Perkins, female    DOB: 01-28-1961, 50 y.o.   MRN: [id number]  HPI Had a visit with Dr. Arelia Sneddon (GYN) earlier this year and BP was high but she had been out for about 4 days.  She has been taking her meds consistantly since then.  No CP, SOB, or SE if the ACEi.  Got the crestor and started it yesterday.   She has lost  70 lbs over the last 15 months. Has been on a low carb diet. Some exercise.  Doing well on her current BP med. NO SE.  Problems is chronic. Not on any decongestants or NSAIDs right now.    Pernicious anemia. Last injection was in Feb and we have not rechecked her levels yet.    Had her colonoscopy in feb.    Review of Systems     Objective:   Physical Exam  Constitutional: She appears well-developed and well-nourished.  HENT:  Head: Normocephalic and atraumatic.  Cardiovascular: Normal rate, regular rhythm and normal heart sounds.   Pulmonary/Chest: Effort normal and breath sounds normal.  Musculoskeletal: She exhibits no edema.  Skin: Skin is warm.  Psychiatric: She has a normal mood and affect.          Assessment & Plan:

## 2010-07-07 NOTE — Assessment & Plan Note (Signed)
At goal. Though I would like to see her DBP under 135.  Continue to follow. No changes to current regimen. Continue to work on weight loss.

## 2010-07-13 ENCOUNTER — Telehealth: Payer: Self-pay | Admitting: Family Medicine

## 2010-07-13 NOTE — Telephone Encounter (Signed)
Call pt: Vit B12 looks fantastic!! Let schedule shots for every other month and recheck level in 6 months.

## 2010-07-13 NOTE — Telephone Encounter (Signed)
Pt.notified

## 2010-07-14 ENCOUNTER — Telehealth: Payer: Self-pay | Admitting: Family Medicine

## 2010-07-14 NOTE — Telephone Encounter (Signed)
Patient states that she was called from our office about her lab results but was not given her actual B12 number.. She would like a phone call back please., [phone]. ThanksVictorino Dike 07-14-10

## 2010-07-19 NOTE — Telephone Encounter (Signed)
Pt has already been notified of lab number.spoke with pt

## 2010-07-20 ENCOUNTER — Ambulatory Visit (INDEPENDENT_AMBULATORY_CARE_PROVIDER_SITE_OTHER): Payer: 59 | Admitting: Family Medicine

## 2010-07-20 DIAGNOSIS — E538 Deficiency of other specified B group vitamins: Secondary | ICD-10-CM

## 2010-07-20 MED ORDER — CYANOCOBALAMIN 1000 MCG/ML IJ SOLN
1000.0000 ug | INTRAMUSCULAR | Status: DC
  Administered 2010-07-20: 1000 ug via INTRAMUSCULAR

## 2010-08-16 NOTE — Op Note (Signed)
NAMEEVELYNNE, SPIERS NO.:  [id number]   MEDICAL RECORD NO.:  [id number]          PATIENT TYPE:  OIB   LOCATION:  9312                          FACILITY:  [facility]   PHYSICIAN:  Juluis Mire, M.D.   DATE OF BIRTH:  [birth year]   DATE OF PROCEDURE:  03/13/2007  DATE OF DISCHARGE:                               OPERATIVE REPORT   PREOPERATIVE DIAGNOSIS:  Abnormal uterine bleeding and pelvic adhesions.   POSTOPERATIVE DIAGNOSIS:  Abnormal uterine bleeding and pelvic  adhesions.   OPERATIVE PROCEDURE:  Open laparoscopy, lysis of adhesions, laparoscopic-  assisted vaginal hysterectomy.   SURGEON:  Juluis Mire, M.D.   ASSISTANT:  Zelphia Cairo, M.D.   ANESTHESIA:  General endotracheal.   ESTIMATED BLOOD LOSS:  300 mL.   PACKS AND DRAINS:  None.   INTRAOPERATIVE BLOOD REPLACED:  None.   COMPLICATIONS:  None.   INDICATIONS:  Are as dictated in the history and physical.   PROCEDURE IN DETAIL:  The patient was taken the OR and placed in the  supine position.  After a satisfactory level of general endotracheal  anesthesia was obtained, the patient was placed in the dorsal lithotomy  position using the Allen stirrups.  The abdomen, perineum and vagina  were prepped out with Betadine.  Bladder was emptied by in-and-out  catheterization.  A Hulka tenaculum was put in place and secured.  The  patient was then draped in a sterile field.  Subumbilical incision was  made knife and carried to the subcutaneous tissue.  Fascia was entered  sharply and incision in fascia extended laterally.  We were able to  enter the peritoneum with blunt pressure.  There were no periumbilical  adhesions.  An open laparoscopic trocar was put in place and secured.  Laparoscope was introduced.  There was no evidence of injury to adjacent  organs.  The abdomen was deflated with carbon dioxide.  A 5-mm trocar  was put in place in suprapubic area and the left lateral quadrant,  visualization, uterus was upper limits of normal size.  The right ovary  was somewhat small and atrophic and was adherent to the right side of  the uterus.  There were areas of omentum in the cul-de-sac with  adhesions to the back of the uterus and the right and left pelvis.  The  left ovary was unremarkable.  The sigmoid was somewhat adherent to the  left pelvic sidewall.  Therefore, the cul-de-sac was somewhat  obliterated.  There were relatively flimsy adhesions.  She also had  adhesions of small bowel to the right lower quadrant evidently where her  appendix had been surgically removed.  At this point in time, we went to  the cul-de-sac.  Using scissors, we were able to free up the omentum  from the cul-de-sac area completely and also the sigmoid from the left  side.  There was no evidence of injury to the sigmoid or any small  intestine.  Therefore, we had completely freed up the cul-de-sac at this  point in time.  Again, the right ovary, at least the medial  part, was  attached to the uterus.  There was not much of a utero-ovarian ligament.  We then went anteriorly.  There were adhesions between the anterior  abdominal wall and the uterus.  These were also taken down using sharp  dissection.  At this point in time, we brought in the gyrus.  We first  went to the left side.  The left utero-ovarian pedicle was cauterized  incised and the left round ligament was cauterized incised.  We then  went to the right side and what was there of the utero-ovarian pedicle  was cauterized incised along with the right round ligament.  At this  point in time, we had good freeing of the adnexa, and the decision was  to go vaginally.   At this point in time, the patient's legs were repositioned.  The Hulka  tenaculum was then removed.  Weighted speculum was placed in vaginal  vault.  The cervix was grasped with Christella Hartigan tenaculum.  Cul-de-sac was  entered sharply.  Both uterosacral ligaments were  clamped, cut and  suture ligated 0 Vicryl.  Reflection of the vaginal mucosa anteriorly  was incised and bladder was dissected superiorly.  Paracervical tissue  was clamped, cut and suture ligated with 0 Vicryl.  Using clamp, cut and  tie technique, a suture ligature of 0 Vicryl of the parametrium serially  separated from the sides of the uterus.  The vesicouterine space was  identified, entered and retractors put in place.  At this point in time,  the uterus was flipped, remaining pedicles were clamped and cut.  Uterus  and cervix were passed off the operative field and sent to pathology.  Held pedicle was secured with free tie of 0 Vicryl.  At this point in  time, we actually reapproximated some of the posterior vaginal mucosa to  the cul-de-sac peritoneum with interrupted figure-of-eights of 0 Vicryl.  We had good hemostasis at this point.  The vaginal cuff was then closed  in a vertical fashion with figure-of-eights of 0 Monocryl.  Foley was  placed to straight drainage with retrieval of a large amount clear  urine.   At this point in time, the weighted speculum was then removed.  The  patient's legs were repositioned.  Laparoscope was reintroduced.  Abdomen was reinflated of its carbon dioxide.  Visualization did reveal  some oozing from the vaginal cuff.  We brought this under control with  cautery using the gyrus.  At this point, we thoroughly irrigated the  pelvis, had good hemostasis of the both ovaries and the cul-de-sac.  Urine output remained clear and adequate.  At this point, the abdomen  was deflated of carbon dioxide.  All trocars removed.  Subumbilical  fascia closed with two figure-of-eights of 0 Vicryl, skin with  interrupted stitches of 4-0 Vicryl.  Suprapubic incision was closed with  Dermabond.  The patient was taken out of dorsal position, once alert and  extubated, transferred to the recovery room in good condition.  Sponge,  instrument and needle count reported  as correct by circulating nurse x2.  Foley catheter remained clear at time of closure.      Juluis Mire, M.D.  Electronically Signed     JSM/MEDQ  D:  03/13/2007  T:  03/13/2007  Job:  [id number]

## 2010-08-16 NOTE — Discharge Summary (Signed)
NAMEAMBERMARIE, Stephanie Perkins NO.:  [id number]   MEDICAL RECORD NO.:  [id number]          PATIENT TYPE:  OIB   LOCATION:  9312                          FACILITY:  [facility]   PHYSICIAN:  Juluis Mire, M.D.   DATE OF BIRTH:  [birth year]   DATE OF ADMISSION:  03/13/2007  DATE OF DISCHARGE:  03/14/2007                               DISCHARGE SUMMARY   ADMISSION DIAGNOSES:  1. Abnormal uterine bleeding.  2. Pelvic adhesions.   DISCHARGE DIAGNOSES:  1. Abnormal uterine bleeding.  2. Pelvic adhesions.   PROCEDURE:  Laparoscopic-assisted vaginal hysterectomy.   For complete history and physical, please see dictated note.   HOSPITAL COURSE:  The patient underwent above-noted surgery.  Pathology  is pending.  Postoperatively, did relatively well the morning of  discharge, she was having some nausea the generally was doing well.  Her  Foley had been discontinued.  She was able to void without difficulty  and had minimal bleeding.  All incisions were intact and unremarkable.  Her abdomen was soft.  Bowel sounds were active.  Her postop hemoglobin  was 12.4.   COMPLICATIONS:  None encountered during stay in the hospital.   Discharged home in stable condition.   DISPOSITION:  She will be observed this morning providing she is doing  okay from a GI standpoint, will be discharged home.  Routine  instructions were given.  She is to avoid heavy lifting, vaginal  entrance or driving a car.  She is to watch for signs of infection.  She  should report nausea, vomiting.  She should report increasing vaginal  bleeding.  She should report increasing abdominal/pelvic pain.  Also  discussed signs and symptoms of deep venous thrombosis and pulmonary  embolus.   MEDICATIONS:  Tylox as needed for pain.  She will follow up in the  office in one week dictation.      Juluis Mire, M.D.  Electronically Signed     JSM/MEDQ  D:  03/14/2007  T:  03/14/2007  Job:  [id number]

## 2010-08-16 NOTE — H&P (Signed)
NAME:  Stephanie Perkins, Stephanie Perkins NO.:  [id number]   MEDICAL RECORD NO.:  [id number]          PATIENT TYPE:  OIB   LOCATION:  9312                          [facility]ACILITY:  [facility]   PHYSICIAN:  Juluis Mire, M.D.   DATE OF BIRTH:  1960-08-07   DATE OF ADMISSION:  03/13/2007  DATE OF DISCHARGE:                              HISTORY & PHYSICAL   HISTORY OF PRESENT ILLNESS:  The patient is a 46-year gravida 5, para 1  abortus 12 female who presents for laparoscopic-assisted vaginal  hysterectomy.   In relation to the present admission, the patient has a long  gynecological history.  She has had a previous cryoablation.  Her cycles  did return.  They are now approximately every 28 days.  They are heavy  with associated clots and cramping.  Denies any vasomotor  symptomatology.  Repeated a saline infusion with ultrasound and  endometrial sampling.  Did have repeat endometrial buildup, endometrial  sampling was negative.  We had discussed options of management of the  menorrhagia.  Because of tobacco use, we really cannot use birth control  pills.  We discussed IUD's, repeat ablation or hysterectomy.  The  patient now presents for laparoscopic-assisted vaginal hysterectomy.   OTHER COMPLICATING ISSUES:  She has had a previous hospitalization where  she underwent exploratory surgery with finding of an inflammatory  process in the pelvis.  At that point, they removed the appendix as well  as the right fallopian tube.  This was done by general surgeon.  Subsequently, we were following a 6-cm ovarian cyst.  We did a  laparoscopy.  We removed the left ovarian cyst.  We were trying to be  simple.  They also did a left tubal ligation. The right tube was  surgically absent.  It was noted at that time she did have some pelvic  adhesions, probably from prior surgery.  Although at the present time,  pain does not be seen be a significant issue.   ALLERGIES:  PENICILLIN.   MEDICATIONS:  She has  been on Lipitor, Prozac and Valtrex.  Other  medications as listed.   PAST MEDICAL HISTORY:  1. History of multiple sclerosis.  This is managed at [facility].  2. She has a history of irritable bowel syndrome.  3. History of microscopic hematuria managed in the past by Dr. Logan Bores.  4. History of hypertension, hypercholesterolemia followed at      [facility].   PAST SURGICAL HISTORY:  1. One prior cesarean section.  2. Previous laparoscopy in 1986.  3. Exploratory surgery in 1999 with removal of appendix and right      fallopian tube.  4. Previous laparoscopy with ligation of the left tube and ovarian      cystectomy in 2003.  5. Previous cryoablation.   OBSTETRICAL HISTORY:  She had four spontaneous abortions and does have  one prior cesarean section.   SOCIAL HISTORY:  Does have tobacco use.  No alcohol use.   FAMILY HISTORY:  Noncontributory.   REVIEW OF SYSTEMS:  Noncontributory.   PHYSICAL EXAMINATION:  VITAL SIGNS:  The patient is afebrile, stable  vital signs.  HEENT: The patient is normocephalic.  Pupils equal, round and react to  light and accommodation.  Extraocular movements were intact.  Sclerae  and conjunctivae are clear.  Oropharynx clear.  NECK:  Without thyromegaly.  BREASTS:  No discrete masses.  LUNGS:  Clear.  CARDIOVASCULAR:  System regular rhythm rate without murmurs or gallops.  ABDOMEN:  Exam is benign.  No masses, organomegaly or tenderness.  PELVIC:  Normal external genitalia.  Vaginal is clear.  Cervix  unremarkable.  Uterus upper limits of normal size.  Adnexa unremarkable.  EXTREMITIES:  Trace edema.  Gross normal limits.   IMPRESSION:  1. Abnormal uterine bleeding despite endometrial ablation.  2. History of pelvic adhesions.  3. Multiple sclerosis.  4. Hypertension under active management.  5. Tobacco use.   PLAN:  The patient will ago laparoscopic-assisted vaginal hysterectomy.  The potential need for total abdominal  hysterectomy and pelvic adhesions  are noted as described.  The patient does wish the ovaries conserved as  they do appear normal.  The risk of ovarian cancer has been explained.  The difficulty in discovering this at an early stage is also discussed.  Risks of surgery explained including the risk of infection.  The risk of  hemorrhage that could require transfusion with the risk of AIDS or  hepatitis, risk of injury to adjacent organs.  This can include bladder,  bowel or ureters that could require further exploratory surgery.  Risk  of deep venous thrombosis and pulmonary embolus.  The patient expressed  understanding of indications, risks and other alternatives.      Juluis Mire, M.D.  Electronically Signed     JSM/MEDQ  D:  03/13/2007  T:  03/13/2007  Job:  [id number]

## 2010-08-19 NOTE — H&P (Signed)
[facility]  Patient:    Stephanie Perkins, Stephanie Perkins Visit Number: [id number] MRN: [id number]          Service Type: Attending:  Juluis Mire, M.D. Dictated by:   Juluis Mire, M.D.                           History and Physical  HISTORY OF PRESENT ILLNESS:   The patient is a 50 year old gravida 5, para 1, abortus 4, married white female, who presents for diagnostic laparoscopy ______ standby, bilateral tubal ligation, as well as hysteroscopy.  In relation to the present admission, the patient has been having trouble with increasing pelvic pain and discomfort over the past year.  She had had an IUD put in place in April 2002.  She was doing relatively well after that until she began experiencing some increasing discomfort and pain.  We treated her with antibiotics for the presumptive issue of possible mild inflammatory process.  But, because of continued pain and discomfort, we subsequently removed the IUD.  Despite its removal, the patient has continued to have significant pelvic pain and discomfort.  She had an ultrasound performed which was basically unremarkable.  There was no evidence of any masses or uterine abnormalities.  It is of note that she had a previous exploratory surgery done in the past when she was admitted to the hospital with severe right lower quadrant pain.  It was found that she had evidence of an abscess in the right lower quadrant which was either from the tube or appendix.  The appendix and right fallopian tube were removed at that time.  Therefore, the possibility of adhesions are entertained.  Therefore, we are going to proceed with laparoscopic evaluation.  It is also of note that she is desirous of permanent sterility at this point in time.  Therefore, we will do a left tubal ligation. She does understand that this may be potentially irreversible and preclude future pregnancies.  Other options, in terms of birth control, have  been discussed.  It was also discussed there is a failure rate of 1 in 200 that can be in the form of ectopic pregnancies.  The patient has also had some abnormal bleeding with slight cycle irregularities.  Because of this we are going to proceed also with hysteroscopy.  ALLERGIES:                    She is allergic to PENICILLIN at the present time.  MEDICATIONS:                  Prozac, Lipitor, Valtrex, as well as two medications for multiple sclerosis which are Cylert and Avonex.  PAST MEDICAL HISTORY:         1. Usual childhood diseases without any                                  significant sequelae.                               2. History of irritable bowel syndrome.                               3. Multiple sclerosis, on the above-noted  medications.                               4. History of persistent microscopic hematuria,                                  evaluated by Dr. Logan Bores in the past.                               5. Managed for hypercholesterolemia by                                  [facility].  PAST SURGICAL HISTORY:        1. One cesarean section.                               2. Four spontaneous abortions, with a history of                                  recurrent pregnancy losses.                               3. Previous laparoscopy in 1986, for evaluation                                  of a possible ectopic pregnancy.                               4. As noted, underwent exploratory laparotomy in                                  1999, with removal of the appendix and right                                  tube, by Dr. Zachery Dakins.  SOCIAL HISTORY:               No tobacco or alcohol use.  FAMILY HISTORY:               Noncontributory.  REVIEW OF SYSTEMS:            Noncontributory.  PHYSICAL EXAMINATION:  VITAL SIGNS:                  Afebrile, stable vital signs.  HEENT:                         Normocephalic.  Pupils are equal, round, and reactive to light and accommodation.  Extraocular movements intact.  Sclerae and conjunctivae clear.  Oropharynx clear.  NECK:                         Without thyromegaly.  BREASTS:  No discrete masses.  LUNGS:                        Clear.  CARDIAC:                      Regular rate.  No murmurs or gallops.  ABDOMEN:                      Benign.  PELVIC:                       Normal external genitalia.  Vaginal mucosa clear.  Cervix unremarkable.  Uterus mid position.  Mild tenderness.  Adnexa unremarkable.  EXTREMITIES:                  Trace edema.  NEUROLOGIC:                   Grossly within normal limits.  IMPRESSION:                   1. Pelvic pain, possibly secondary to adhesive                                  processes.                               2. Abnormal uterine bleeding.                               3. Desirous of sterility.                               4. Multiple sclerosis.                               5. Hypercholesterolemia.  PLAN:                         The patient will undergo the above-noted surgery.  The risks of surgery have been discussed including the risk of anesthesia, the risk of infection, the risk of hemorrhage which could necessitate transfusion with the risk of AIDS or hepatitis, the risk of injury to adjacent organs including bladder, bowel, ureters which could require full exploratory surgery, the risk of deep vein thrombosis and pulmonary embolus. The patient appears to understand indications and risks. Dictated by:   Juluis Mire, M.D. Attending:  Juluis Mire, M.D. DD:  03/20/01 TD:  03/20/01 Job: [phone]

## 2010-08-19 NOTE — Op Note (Signed)
   NAMETEQUITA, Stephanie Perkins                          ACCOUNT NO.:  [id number]   MEDICAL RECORD NO.:  [id number]                   PATIENT TYPE:  AMB   LOCATION:  SDC                                  FACILITY:  [facility]   PHYSICIAN:  Juluis Mire, M.D.                DATE OF BIRTH:  May 21, 1960   DATE OF PROCEDURE:  02/13/2002  DATE OF DISCHARGE:                                 OPERATIVE REPORT   PREOPERATIVE DIAGNOSES:  Menorrhagia.   POSTOPERATIVE DIAGNOSES:  Menorrhagia.   OPERATIVE PROCEDURE:  Endometrial ablation using the cryogen probe.   SURGEON:  Juluis Mire, M.D.   ANESTHESIA:  General.   ESTIMATED BLOOD LOSS:  Minimal.   PACKS AND DRAINS:  None.   INTRAOPERATIVE BLOOD PLACED:  None.   COMPLICATIONS:  None.   INDICATIONS:  Dictated in history and physical.   PROCEDURE AS FOLLOWS:  The patient taken to the OR and placed in supine  position.  After a satisfactory level of general anesthesia was obtained  patient was placed in the dorsal lithotomy position using the Allen  stirrups.  Perineum and vagina were cleansed out with Betadine, draped as a  sterile field.  Speculum was placed in the vaginal vault.  Cervix grasped  with single tooth tenaculum.  Uterus sounded to 8 cm.  Cervix was serially  dilated to a size 26 Pratt dilator.  The cryogen probe was inserted first  into the right side of the uterus.  Freezing was performed for six minutes.  The probe was then defrosted and replaced on the left side of the uterine  cavity.  Freezing was then again done for six minutes.  Defrosting occurred  for one minute, then the probe was removed.  There were no signs of  perforation or active bleeding.  The single tooth tenaculum and speculum  were then removed, the patient taken out of the dorsal lithotomy position.  Once alert and extubated transferred to recovery room in good condition.  Sponge, instrument, needle count reported as correct by circulating nurse.                                      Juluis Mire, M.D.    JSM/MEDQ  D:  02/13/2002  T:  02/13/2002  Job:  [id number]

## 2010-08-19 NOTE — H&P (Signed)
NAME:  Stephanie Perkins, Stephanie Perkins NO.:  [id number]   MEDICAL RECORD NO.:  [id number]                   PATIENT TYPE:  AMB   LOCATION:  SDC                                  FACILITY:  [facility]   PHYSICIAN:  Juluis Mire, M.D.                DATE OF BIRTH:  07-07-1960   DATE OF ADMISSION:  02/13/2002  DATE OF DISCHARGE:                                HISTORY & PHYSICAL   HISTORY OF PRESENT ILLNESS:  The patient is a 50 year old gravida 5, para 1,  abortus 4, married white female who presents for cryoablation of the  endometrium.   In relation to the present admission, the patient is having trouble with  increasing menorrhagia.  Cycles have generally been regular.  She reports  five to seven days of flow.  Changing tampons every four to five hours with  increasing clots.  Associated with this is significant dysmenorrhea.  Pills  are becoming somewhat limiting from the patient's standpoint.  Due to  tobacco use, birth control pills have been contraindicated.  Of note, she  underwent a previous diagnostic laparoscopy in the past with the finding of  a dense abdominal pelvic adhesions involving the small bowel and colon.  She  did have a 6 cm cyst at that time that was removed, and turned out to be a  benign simple cyst.  The left tube was tied at that time, the right tube was  surgically absent from previous surgery, including appendectomy.  In view of  the bleeding problem, we did do a saline infusion, ultrasound, endometrial  sampling, both of which were negative.  She now presents for endometrial  ablation.  Success rate of 70% is quoted.  Obviously, we could continue to  have problems requiring further surgical management.   ALLERGIES:  PENICILLIN.   MEDICATIONS:  1. Lipitor.  2. Prozac.  3. Cylert.  4. Valtrex.   PAST MEDICAL HISTORY:  1. The patient had usual childhood diseases without any significant sequela.  2. History of irritable bowel syndrome under  active management.  3. She has a history of hypercholesterolemia managed by [facility].  4. History of persistent microscopic hematuria managed by Dr. Logan Bores.  5. History of multiple sclerosis, on medications as noted.   PAST SURGICAL HISTORY:  1. The patient has had one cesarean section.  2. She had previous laparoscopy in 1986, for evaluation of possible ectopic     pregnancy.  3. She underwent exploratory laparotomy in 1999, with removal of appendix     and right tube by Dr. Zachery Dakins.   OBSTETRICAL HISTORY:  1. She has had four spontaneous abortions.  2. 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   SOCIAL HISTORY:  No tobacco or alcohol use.   FAMILY HISTORY:  Noncontributory.   REVIEW OF SYMPTOMS:  Noncontributory.   PHYSICAL EXAMINATION:  VITAL  SIGNS:  The patient is afebrile with stable  vital signs.  HEENT:  The patient is normocephalic.  Pupils equal, round, reactive to  light and accommodation.  Extraocular movements were intact.  Sclerae and  conjunctivae clear.  Oropharynx was clear.  NECK:  Without thyromegaly.  BREASTS:  No discrete masses.  LUNGS:  Clear.  CARDIOVASCULAR:  Regular rate and rhythm without murmurs, rubs, or gallops.  ABDOMEN:  Benign, no masses, organomegaly, or tenderness.  PELVIC:  Normal external genitalia.  Vaginal mucosa is clear.  Cervix  unremarkable.  Uterus normal size, shape, and contour.  Adnexa free of  masses or tenderness.  EXTREMITIES:  Trace edema.  NEUROLOGIC:  Grossly within normal limits.   IMPRESSION:  1. Menorrhagia.  2. Dysmenorrhea.  3. Known pelvic adhesions.  4. Hypercholesterolemia.   PLAN:  The patient will undergo endometrial ablation.  The risks of surgery  have been discussed, including the risks of infection.  The rare risk of  vascular injury that could lead to hemorrhage requiring transfusion or  possible hysterectomy.  The risk of perforation that could lead to injury to   adjacent organs requiring exploratory surgery.  The risk of deep vein  thrombosis and pulmonary embolus.  The patient voiced understanding of the  indications and risks, and does understand the limitations of the procedure.                                                 Juluis Mire, M.D.    JSM/MEDQ  D:  02/13/2002  T:  02/13/2002  Job:  [id number]

## 2010-08-19 NOTE — Op Note (Signed)
[facility]  Patient:    Stephanie Perkins, Stephanie Perkins Visit Number: [id number] MRN: [id number]          Service Type: DSU Location: [facility] Attending Physician:  Frederich Balding Dictated by:   Juluis Mire, M.D. Proc. Date: 03/20/01 Admit Date:  03/20/2001                             Operative Report  PREOPERATIVE DIAGNOSES:       Desires sterility, pelvic pain, and abnormal bleeding.  POSTOPERATIVE DIAGNOSES:      Desires sterility, pelvic pain, abnormal bleeding, extensive pelvic adhesions, and a simple right ovarian cyst.  OPERATIVE PROCEDURE:          Hysteroscopy with dilatation and curettage, open laparoscopy, extensive lysis of adhesions, right ovarian cystectomy, left tubal fulguration.  SURGEON:                      Juluis Mire, M.D.  ANESTHESIA:                   General endotracheal.  ESTIMATED BLOOD LOSS:         Minimal.  PACKS AND DRAINS:             None.  INTRAOPERATIVE BLOOD:         None.  COMPLICATIONS:                None.  INDICATIONS:                  Dictated in history and physical.  PROCEDURE:                    Patient taken to the OR and placed in supine position.  After a satisfactory level of general endotracheal was obtained the patient was placed in the dorsal lithotomy position using the Allen stirrups. At this point in time the abdomen, perineum, and vagina were prepped out with Betadine.  The patient was then draped out for hysteroscopy.  A speculum was placed in the vaginal vault.  The cervix was grasped with a single tooth tenaculum.  The uterus sounded to approximately 8 cm.  Cervix was serially dilated to a size 29 Pratt dilator.  The hysteroscope was then introduced. The intrauterine cavity was distended using sorbitol.  Visualization revealed no evidence of any polyps or fibroids.  Endometrium appeared to be normal. Both tubal ostia were visualized.  The hysteroscope was then removed. Endometrial  curettings were obtained and sent for pathologic review.  The Hulka tenaculum was then put in place.  The single tooth tenaculum and speculum were then removed.  The bladder was emptied by catheterization.  Patient was then made ready for laparoscopy.  A subumbilical incision was made with a knife and carried through subcutaneous tissue.  The anterior rectus fascia was entered sharply.  Incision extended laterally.  We really could not see a clear window in the peritoneum and we had to keep dissecting laterally. Eventually on the left side we were able to identify a free peritoneal window. We entered this.  There was some omentum that was stuck to this area.  This was pushed away.  At this point in time two lateral sutures of 0 Vicryl were placed in the lateral edge of the fascia and held.  The Hasson cannula was put in place and secured.  The abdomen was then inflated  with carbon dioxide.  The laparoscope was introduced.  She had dense omental adhesions in the midline and the right lower quadrant.  We were able to guide the laparoscope around the left side of the abdomen into the pelvis.  She had extensive adhesions. She had one loop of small bowel that was adhesed to the anterior abdominal wall with some flimsy adhesions.  The uterus was adherent into the cul-de-sac to the sigmoid colon and actually the left tube and ovary appeared relatively normal.  The small bowel was also attached to the right ovary which was cystically enlarged with approximately a 6 cm cyst.  It appeared to be simple in nature with no excrescences.  At this point in time we brought the scissors and cautery through the laparoscope and first took down the small bowel that was adhered in the midline.  This was done safely with no evidence of injury to the bowel.  We then went down into the lower pelvic cavity and began freeing the bowel attachment to the right lower quadrant.  The small bowel was taken down using sharp  dissection and cautery and we were able to eventually completely free up the right ovary which was just again cystically enlarged. There was no evidence of injury to the bowel at all during this extensive dissection.  We decided that we really could not safely remove the ovary, therefore we made an ovarian cystotomy incision and an ovarian cystectomy was performed.  The lining of the cyst was easily shelled out.  It was very smooth and benign appearing and sent for pathologic review.  We then used thorough irrigation and cautery to bring about hemostasis.  We had good hemostasis at this time.  Then we went to the left side.  The left tube was identified and cauterized for a segment of 2 cm.  Cautery was continued until resistance read 0.  Cautery did carry into the mesosalpinx.  We then recauterized the same segment of tube.  At this point in time we again irrigated.  There was a little bit of oozing from some of the adhesions that had been taken down. This was brought under control with cautery.  We thoroughly irrigated further and there was good hemostasis and there was no evidence of any injury to the bowel at this point.  The abdomen was deinflated of its carbon dioxide.  All trocars removed.  Subumbilical incisional fascia was closed with two figure-of-eight of 0 Vicryl.  The skin was closed with Steri-Strips.  The Hulka tenaculum was then removed.  The patient was taken out of the dorsal lithotomy position and once alert and extubated transferred to recovery room in good condition.  Sponge, instrument, needle count reported as correct by circulated nurse x 2. Dictated by:   Juluis Mire, M.D. Attending Physician:  Frederich Balding DD:  03/20/01 TD:  03/20/01 Job: [id number]

## 2010-08-24 ENCOUNTER — Telehealth: Payer: Self-pay | Admitting: Family Medicine

## 2010-08-24 LAB — LIPID PANEL
LDL Cholesterol: 133 mg/dL — ABNORMAL HIGH (ref 0–99)
VLDL: 21 mg/dL (ref 0–40)

## 2010-08-24 LAB — COMPLETE METABOLIC PANEL WITH GFR
ALT: 12 U/L (ref 0–35)
AST: 12 U/L (ref 0–37)
CO2: 25 mEq/L (ref 19–32)
Calcium: 10.1 mg/dL (ref 8.4–10.5)
Chloride: 103 mEq/L (ref 96–112)
GFR, Est African American: >60 mL/min (ref >60)
Sodium: 140 mEq/L (ref 135–145)
Total Bilirubin: 0.5 mg/dL (ref 0.3–1.2)
Total Protein: 6.9 g/dL (ref 6.0–8.3)

## 2010-08-24 NOTE — Telephone Encounter (Addendum)
Call patient: LDL cholesterol looks much better. Almost at goal. I would like to incrase her to crestor 40mg  or we can try 1 and 1/2 of the 20mg  tab (which makes 30) and then recheck level in 6 months.   It is half of what it was 2 years ago. He is almost at goal. B12 looks fantastic. CMP is normal.

## 2010-08-26 MED ORDER — ROSUVASTATIN CALCIUM 40 MG PO TABS: 40.0000 mg | ORAL_TABLET | Freq: Every day | ORAL | Status: DC

## 2010-08-26 NOTE — Telephone Encounter (Signed)
Pt notified and would like to change to 40mg  crestor and wants rx [facility]

## 2010-10-11 ENCOUNTER — Other Ambulatory Visit: Payer: Self-pay | Admitting: Family Medicine

## 2010-10-24 ENCOUNTER — Other Ambulatory Visit: Payer: Self-pay | Admitting: Family Medicine

## 2011-01-09 LAB — BASIC METABOLIC PANEL
Chloride: 101
Creatinine, Ser: 0.68
GFR calc Af Amer: >60

## 2011-01-09 LAB — CBC
HCT: 36.8
MCHC: 33.3
Platelets: 272
RBC: 5.53 — ABNORMAL HIGH
WBC: 10.6 — ABNORMAL HIGH
WBC: 8.2

## 2011-01-09 LAB — HCG, SERUM, QUALITATIVE: Preg, Serum: NEGATIVE

## 2011-01-17 ENCOUNTER — Telehealth: Payer: Self-pay | Admitting: Family Medicine

## 2011-01-17 NOTE — Telephone Encounter (Signed)
Pt called and left mess for the triage nurse stating she needs lab orders sent down to [facility] for her lipid panel and her B12 level. Plan:  Reviewed the pt chart file and her chol level is not due til 02-24-11,a nd vitamin B 12 was good in May 2012 so that will not need to be repeated at this time.  LMOM for the pt with this information. Jarvis Newcomer, LPN [facility]ingo Dimes

## 2011-01-19 ENCOUNTER — Other Ambulatory Visit: Payer: Self-pay | Admitting: *Deleted

## 2011-01-19 MED ORDER — ROSUVASTATIN CALCIUM 40 MG PO TABS: 40.0000 mg | ORAL_TABLET | Freq: Every day | ORAL | Status: DC

## 2011-01-23 ENCOUNTER — Other Ambulatory Visit: Payer: Self-pay | Admitting: *Deleted

## 2011-01-23 MED ORDER — LISINOPRIL-HYDROCHLOROTHIAZIDE 20-12.5 MG PO TABS: 1.0000 | ORAL_TABLET | Freq: Every day | ORAL | Status: DC

## 2011-01-24 ENCOUNTER — Other Ambulatory Visit: Payer: Self-pay | Admitting: *Deleted

## 2011-01-26 ENCOUNTER — Other Ambulatory Visit: Payer: Self-pay | Admitting: *Deleted

## 2011-01-26 MED ORDER — LISINOPRIL-HYDROCHLOROTHIAZIDE 20-12.5 MG PO TABS: 1.0000 | ORAL_TABLET | Freq: Every day | ORAL | Status: DC

## 2011-01-30 ENCOUNTER — Ambulatory Visit (INDEPENDENT_AMBULATORY_CARE_PROVIDER_SITE_OTHER): Payer: Managed Care, Other (non HMO) | Admitting: Family Medicine

## 2011-01-30 ENCOUNTER — Encounter: Payer: Self-pay | Admitting: Family Medicine

## 2011-01-30 VITALS — BP 128/87 | HR 101 | Ht 66.0 in | Wt 140.0 lb

## 2011-01-30 DIAGNOSIS — I10 Essential (primary) hypertension: Secondary | ICD-10-CM

## 2011-01-30 DIAGNOSIS — E785 Hyperlipidemia, unspecified: Secondary | ICD-10-CM

## 2011-01-30 DIAGNOSIS — E538 Deficiency of other specified B group vitamins: Secondary | ICD-10-CM

## 2011-01-30 NOTE — Patient Instructions (Signed)
We will call you with our labwork results.

## 2011-01-30 NOTE — Progress Notes (Signed)
  Subjective:    Patient ID: Stephanie Perkins, female    DOB: [birth year], 50 y.o.   MRN: [id number]  Hypertension This is a chronic problem. The current episode started more than 1 year ago. The problem is controlled. Pertinent negatives include no chest pain or shortness of breath. (Will have episodes of feeling hot and flushed like her body is throbbing.  ) There are no associated agents to hypertension. Past treatments include ACE inhibitors and diuretics. The current treatment provides mild improvement. There are no compliance problems.   Noticing lots of urinary frequency on the diuetic. Says doesn't want to change it right now.  He just got a 9 0days supply. No dysuria.    Hyperlipidemia  - Doing well on the crestor.  She is due to recheck her levels today.  Really wants to check her B12 def. She has premanent hand neuropathy. Has been doing them once a months. She wants to see if this is enough for maintenance.    Review of Systems  Respiratory: Negative for shortness of breath.   Cardiovascular: Negative for chest pain.       Objective:   Physical Exam  Constitutional: She is oriented to person, place, and time. She appears well-developed and well-nourished.  HENT:  Head: Normocephalic and atraumatic.       TMs and canals are clear.   Eyes: Conjunctivae and EOM are normal. Pupils are equal, round, and reactive to light.  Neck: Neck supple. No thyromegaly present.  Cardiovascular: Normal rate, regular rhythm and normal heart sounds.        No carotid bruits.  Pulmonary/Chest: Effort normal and breath sounds normal. She has no wheezes.  Lymphadenopathy:    She has no cervical adenopathy.  Neurological: She is alert and oriented to person, place, and time.  Skin: Skin is warm and dry.  Psychiatric: She has a normal mood and affect. Her behavior is normal.          Assessment & Plan:  Hypertension-blood pressure looks fantastic today. As far as the hot flushing sensation  she is describing it is unclear to me what this might be. Her blood pressure looks very well controlled today. Certainly she could be having hypertensive episodes but I don't think this is very likely. Also consider checking her thyroid if this persists. She would like to continue her diuretic even though she is having increased urinary frequency. Certainly if she changes her mind she can just call the office and we can increase the lisinopril to 40 mg and discontinue her diuretic if she would like to try this for a month.  Hyperlipidemia-she is due to recheck her lipid level on increased dose of Crestor. Hopefully she will be close to goal.  Vitamin B12 deficiency. She is on once monthly injections. We'll check serum level to make sure that she is at all.

## 2011-02-06 LAB — LIPID PANEL
Cholesterol: 161 mg/dL (ref 0–200)
HDL: 59 mg/dL (ref >39)
Total CHOL/HDL Ratio: 2.7 Ratio
Triglycerides: 69 mg/dL (ref <150)

## 2011-02-06 LAB — VITAMIN B12: Vitamin B-12: 562 pg/mL (ref 211–911)

## 2011-02-07 ENCOUNTER — Telehealth: Payer: Self-pay | Admitting: *Deleted

## 2011-02-07 LAB — COMPLETE METABOLIC PANEL WITH GFR
Alkaline Phosphatase: 48 U/L (ref 39–117)
BUN: 16 mg/dL (ref 6–23)
CO2: 27 mEq/L (ref 19–32)
Creat: 0.47 mg/dL — ABNORMAL LOW (ref 0.50–1.10)
GFR, Est African American: >89 mL/min (ref >89)
GFR, Est Non African American: >89 mL/min (ref >89)
Glucose, Bld: 101 mg/dL — ABNORMAL HIGH (ref 70–99)
Total Bilirubin: 0.5 mg/dL (ref 0.3–1.2)

## 2011-02-07 NOTE — Telephone Encounter (Signed)
Message copied by Wyline Beady on Tue Feb 07, 2011  3:06 PM ------      Message from: Nani Gasser D      Created: Tue Feb 07, 2011  8:03 AM       Cluster looks fantastic. Continue the Crestor. An and B12 looks great. On her complete metabolic panel her blood sugar is borderline at 101.

## 2011-02-07 NOTE — Telephone Encounter (Signed)
Message copied by Wyline Beady on Tue Feb 07, 2011  2:35 PM ------      Message from: Nani Gasser D      Created: Tue Feb 07, 2011  8:03 AM       Cluster looks fantastic. Continue the Crestor. An and B12 looks great. On her complete metabolic panel her blood sugar is borderline at 101.

## 2011-02-07 NOTE — Telephone Encounter (Signed)
LMOM

## 2011-02-07 NOTE — Telephone Encounter (Signed)
LMOM with results

## 2011-03-21 ENCOUNTER — Emergency Department
Admission: EM | Admit: 2011-03-21 | Discharge: 2011-03-21 | Disposition: A | Discharge status: Home / Self Care | Payer: Managed Care, Other (non HMO) | Source: Home / Self Care

## 2011-03-21 ENCOUNTER — Telehealth: Payer: Self-pay | Admitting: *Deleted

## 2011-03-21 MED ORDER — FLUOXETINE HCL 10 MG PO CAPS: ORAL_CAPSULE | ORAL | Status: DC

## 2011-03-21 NOTE — Telephone Encounter (Signed)
Pt would like to know if you would refill her Prozac- was on it a few years ago but came off and now she feels like she needs it again- anxiety and emotional stress due to [employer].

## 2011-03-21 NOTE — Telephone Encounter (Signed)
Ok to restart.  Added med will need to send it.

## 2011-03-21 NOTE — Telephone Encounter (Signed)
Pt notified med sent to pharmacy. KJ LPN 

## 2011-03-24 ENCOUNTER — Ambulatory Visit (INDEPENDENT_AMBULATORY_CARE_PROVIDER_SITE_OTHER): Payer: Managed Care, Other (non HMO) | Admitting: Family Medicine

## 2011-03-24 ENCOUNTER — Encounter: Payer: Self-pay | Admitting: Family Medicine

## 2011-03-24 VITALS — BP 113/65 | HR 104 | Wt 139.0 lb

## 2011-03-24 DIAGNOSIS — F411 Generalized anxiety disorder: Secondary | ICD-10-CM

## 2011-03-24 DIAGNOSIS — F419 Anxiety disorder, unspecified: Secondary | ICD-10-CM

## 2011-03-24 MED ORDER — ALPRAZOLAM 0.5 MG PO TABS
0.5000 mg | ORAL_TABLET | Freq: Every evening | ORAL | Status: AC | PRN
Start: 2011-03-24 — End: 2011-04-23

## 2011-03-24 NOTE — Progress Notes (Signed)
  Subjective:    Patient ID: Stephanie Perkins, female    DOB: 01/25/1961, 50 y.o.   MRN: [id number]  HPI She has been been stressed and anxious, esp with work.  Says has been dizzy. Called here and we refilled her prozac. Started it again on Tuesday.  Says the dizziness is a little better today.  No true vertigo. Getting light headed and feeling like might pass out.  Palpitations for 5 min at the longest, almost daily for the last week.  Note she has taken fluoxetine in the past and has done well with it. She has never used a benzodiazepine. She says she dreads going to work. Fortunately she feels stuck. She says she's been looking for another job but nothing has come open.   Review of Systems     Objective:   Physical Exam  Constitutional: She is oriented to person, place, and time. She appears well-developed and well-nourished.  HENT:  Head: Normocephalic and atraumatic.  Cardiovascular: Normal rate, regular rhythm and normal heart sounds.   Pulmonary/Chest: Effort normal and breath sounds normal.  Neurological: She is alert and oriented to person, place, and time.  Psychiatric:       She appears extremely anxious and is constantly fidgeting during the office visit. She is wringing her hands over and over. She is almost tearful.          Assessment & Plan:  Anxiety - GAD-7  score of 20. Started the prozac 4 days ago. Discussed it will take several weeks to really kick in. She has been on it before.  We also discussed using a benzo can be addicitive but have a role for short term as we awaiting the SSRI to start working.  Prescription sent for Xanax 0.5 mg. I encouraged her to half or even quarter the tablets to see how she reacts to them before taking a whole tab. She is to use them sparingly. I also told her not to use them before driving. We also discussed more long-term plan of trying to find another job. Right now at least she has a job that will keep her stable financially and gives  her more time to look for a job that maybe he is a better fit and a lot less stress level. 25 minutes face-to-face in counseling and developing a care plan together.

## 2011-04-24 ENCOUNTER — Other Ambulatory Visit: Payer: Self-pay | Admitting: *Deleted

## 2011-04-24 MED ORDER — LISINOPRIL-HYDROCHLOROTHIAZIDE 20-12.5 MG PO TABS: 1.0000 | ORAL_TABLET | Freq: Every day | ORAL | Status: DC

## 2011-04-26 ENCOUNTER — Other Ambulatory Visit: Payer: Self-pay | Admitting: *Deleted

## 2011-04-26 MED ORDER — LISINOPRIL-HYDROCHLOROTHIAZIDE 20-12.5 MG PO TABS: 1.0000 | ORAL_TABLET | Freq: Every day | ORAL | Status: DC

## 2011-05-18 ENCOUNTER — Other Ambulatory Visit: Payer: Self-pay | Admitting: *Deleted

## 2011-05-18 MED ORDER — ROSUVASTATIN CALCIUM 40 MG PO TABS: 40.0000 mg | ORAL_TABLET | Freq: Every day | ORAL | Status: DC

## 2011-05-29 ENCOUNTER — Other Ambulatory Visit: Payer: Self-pay | Admitting: *Deleted

## 2011-05-29 MED ORDER — FLUOXETINE HCL 10 MG PO CAPS: ORAL_CAPSULE | ORAL | Status: DC

## 2011-08-24 ENCOUNTER — Other Ambulatory Visit: Payer: Self-pay | Admitting: Family Medicine

## 2011-10-23 LAB — HM PAP SMEAR: HM Pap smear: NEGATIVE

## 2011-11-10 ENCOUNTER — Other Ambulatory Visit: Payer: Self-pay | Admitting: *Deleted

## 2011-11-10 MED ORDER — LISINOPRIL-HYDROCHLOROTHIAZIDE 20-12.5 MG PO TABS: 1.0000 | ORAL_TABLET | Freq: Every day | ORAL | Status: DC

## 2011-11-13 ENCOUNTER — Other Ambulatory Visit: Payer: Self-pay | Admitting: *Deleted

## 2011-11-13 ENCOUNTER — Telehealth: Payer: Self-pay | Admitting: Family Medicine

## 2011-11-13 MED ORDER — LISINOPRIL-HYDROCHLOROTHIAZIDE 20-12.5 MG PO TABS: 1.0000 | ORAL_TABLET | Freq: Every day | ORAL | Status: DC

## 2011-11-13 NOTE — Telephone Encounter (Signed)
Call patient: We will fill a 30 day supply of her medication to the local pharmacy that she needs an appointment as it has been over 6 months since we have last seen her. See if Jordan Hawks is ok.

## 2011-11-13 NOTE — Telephone Encounter (Signed)
Left message on vm

## 2011-11-14 ENCOUNTER — Telehealth: Payer: Self-pay | Admitting: Family Medicine

## 2011-11-14 MED ORDER — LISINOPRIL-HYDROCHLOROTHIAZIDE 20-12.5 MG PO TABS: 1.0000 | ORAL_TABLET | Freq: Every day | ORAL | Status: DC

## 2011-11-14 NOTE — Telephone Encounter (Signed)
Changed in Pt's chart

## 2011-11-14 NOTE — Telephone Encounter (Signed)
Patient called and advised that [facility] uses [facility] [address] and not [address] just for future refill's. Thanks

## 2011-12-01 ENCOUNTER — Ambulatory Visit (INDEPENDENT_AMBULATORY_CARE_PROVIDER_SITE_OTHER): Payer: Managed Care, Other (non HMO) | Admitting: Family Medicine

## 2011-12-01 ENCOUNTER — Encounter: Payer: Self-pay | Admitting: Family Medicine

## 2011-12-01 VITALS — BP 125/78 | HR 89 | Wt 141.0 lb

## 2011-12-01 DIAGNOSIS — E785 Hyperlipidemia, unspecified: Secondary | ICD-10-CM

## 2011-12-01 DIAGNOSIS — E739 Lactose intolerance, unspecified: Secondary | ICD-10-CM | POA: Insufficient documentation

## 2011-12-01 DIAGNOSIS — F3289 Other specified depressive episodes: Secondary | ICD-10-CM

## 2011-12-01 DIAGNOSIS — F329 Major depressive disorder, single episode, unspecified: Secondary | ICD-10-CM

## 2011-12-01 DIAGNOSIS — F32A Depression, unspecified: Secondary | ICD-10-CM

## 2011-12-01 DIAGNOSIS — I10 Essential (primary) hypertension: Secondary | ICD-10-CM

## 2011-12-01 NOTE — Progress Notes (Signed)
  Subjective:    Patient ID: Stephanie Perkins, female    DOB: [birth year], 51 y.o.   MRN: [id number]  HPI  Hyperlipidemia-Says was feeling really fatigued.  Was difficult to clean her house.  She decided to stop her crestor about a month ago. She says she feels so much better.  She also increased her B12 shots to every 2 weeks.    HTN - no CP or SOB. Doing well on meds.  Tkaoing them regulalry.  Labs are up to date.    MS - doing well overall.  Recently dx with vit D defiency.    Review of Systems     Objective:   Physical Exam  Constitutional: She is oriented to person, place, and time. She appears well-developed and well-nourished.  HENT:  Head: Normocephalic and atraumatic.  Neck: Neck supple. No thyromegaly present.  Cardiovascular: Normal rate, regular rhythm and normal heart sounds.   Pulmonary/Chest: Effort normal and breath sounds normal.  Lymphadenopathy:    She has no cervical adenopathy.  Neurological: She is alert and oriented to person, place, and time.  Skin: Skin is warm and dry.  Psychiatric: She has a normal mood and affect. Her behavior is normal.          Assessment & Plan:  Hyperlipidemia - we had a discussion about either discontinuing the Crestor in trying a different statin or taking it every other night or decreasing back down to 10 mg. She does think she felt better when she was taking the 10 mg dose and says she will try that again. If it works well then she will need a new prescription in about 2 months. We can recheck lipids in October. If we have to switch to a different statin we'll wait to we switch and she is for at least a month before we recheck. I would rather have her slightly above goal but feeling better and taking her statin consistently been not taking it at all.  Depressoin - Well controlled off meds. This is fantastic.  HTN - WEll controlled. F/U in 6 months. Ok for refills.

## 2011-12-27 ENCOUNTER — Other Ambulatory Visit: Payer: Self-pay | Admitting: Family Medicine

## 2011-12-28 ENCOUNTER — Other Ambulatory Visit: Payer: Self-pay | Admitting: *Deleted

## 2011-12-29 ENCOUNTER — Other Ambulatory Visit: Payer: Self-pay | Admitting: Family Medicine

## 2011-12-29 MED ORDER — ACYCLOVIR 400 MG PO TABS: 400.0000 mg | ORAL_TABLET | Freq: Two times a day (BID) | ORAL | Status: DC

## 2012-01-12 ENCOUNTER — Telehealth: Payer: Self-pay | Admitting: *Deleted

## 2012-01-12 NOTE — Telephone Encounter (Signed)
LMOM for pt to return call. KG LPN 

## 2012-01-12 NOTE — Telephone Encounter (Signed)
I would like her to try Livalo. If she would like she can come by and pick up 3-4 weeks of samples that way she can try it. Has a little bit cleaner side effect profile than the Crestor. If it works well then we can get her prescription sent to her pharmacy. Recommend the 2 or 4 mg dose. Preferably the 4 mg if we have it. If not at least 2 mg so she can try it.

## 2012-01-12 NOTE — Telephone Encounter (Signed)
Pt calls and states that the Crestor is still causing fatigue and wanted to know if you would go ahead and change to something else.

## 2012-01-18 ENCOUNTER — Ambulatory Visit (INDEPENDENT_AMBULATORY_CARE_PROVIDER_SITE_OTHER): Payer: Managed Care, Other (non HMO) | Admitting: Family Medicine

## 2012-01-18 ENCOUNTER — Encounter: Payer: Self-pay | Admitting: Family Medicine

## 2012-01-18 ENCOUNTER — Ambulatory Visit (INDEPENDENT_AMBULATORY_CARE_PROVIDER_SITE_OTHER): Payer: Managed Care, Other (non HMO)

## 2012-01-18 VITALS — BP 132/73 | HR 90 | Ht 66.0 in | Wt 142.0 lb

## 2012-01-18 DIAGNOSIS — M546 Pain in thoracic spine: Secondary | ICD-10-CM

## 2012-01-18 DIAGNOSIS — M549 Dorsalgia, unspecified: Secondary | ICD-10-CM

## 2012-01-18 DIAGNOSIS — M4130 Thoracogenic scoliosis, site unspecified: Secondary | ICD-10-CM

## 2012-01-18 MED ORDER — TRAMADOL HCL 50 MG PO TABS: 50.0000 mg | ORAL_TABLET | Freq: Three times a day (TID) | ORAL | Status: DC | PRN

## 2012-01-18 MED ORDER — CYCLOBENZAPRINE HCL 10 MG PO TABS: 10.0000 mg | ORAL_TABLET | Freq: Every evening | ORAL | Status: DC | PRN

## 2012-01-18 MED ORDER — KETOROLAC TROMETHAMINE 60 MG/2ML IM SOLN
60.0000 mg | Freq: Once | INTRAMUSCULAR | Status: AC
  Administered 2012-01-18: 60 mg via INTRAMUSCULAR

## 2012-01-18 NOTE — Addendum Note (Signed)
Addended by: Judie Petit A on: 01/18/2012 04:13 PM   Modules accepted: Orders

## 2012-01-18 NOTE — Patient Instructions (Signed)
Thoracic Strain  You have injured the muscles or tendons that attach to the upper part of your back behind your chest. This injury is called a thoracic strain, thoracic sprain, or mid-back strain.   CAUSES   The cause of thoracic strain varies. A less severe injury involves pulling a muscle or tendon without tearing it. A more severe injury involves tearing (rupturing) a muscle or tendon. With less severe injuries, there may be little loss of strength. Sometimes, there are breaks (fractures) in the bones to which the muscles are attached. These fractures are rare, unless there was a direct hit (trauma) or you have weak bones due to osteoporosis or age. Longstanding strains may be caused by overuse or improper form during certain movements. Obesity can also increase your risk for back injuries. Sudden strains may occur due to injury or not warming up properly before exercise. Often, there is no obvious cause for a thoracic strain.  SYMPTOMS   The main symptom is pain, especially with movement, such as during exercise.  DIAGNOSIS   Your caregiver can usually tell what is wrong by taking an X-ray and doing a physical exam.  TREATMENT    Physical therapy may be helpful for recovery. Your caregiver can give you exercises to do or refer you to a physical therapist after your pain improves.   After your pain improves, strengthening and conditioning programs appropriate for your sport or occupation may be helpful.   Always warm up before physical activities or athletics. Stretching after physical activity may also help.   Certain over-the-counter medicines may also help. Ask your caregiver if there are medicines that would help you.  If this is your first thoracic strain injury, proper care and proper healing time before starting activities should prevent long-term problems. Torn ligaments and tendons require as long to heal as broken bones. Average healing times may be only 1 week for a mild strain. For torn muscles  and tendons, healing time may be up to 6 weeks to 2 months.  HOME CARE INSTRUCTIONS    Apply ice to the injured area. Ice massages may also be used as directed.   Put ice in a plastic bag.   Place a towel between your skin and the bag.   Leave the ice on for 15 to 20 minutes, 3 to 4 times a day, for the first 2 days.   Only take over-the-counter or prescription medicines for pain, discomfort, or fever as directed by your caregiver.   Keep your appointments for physical therapy if this was prescribed.   Use wraps and back braces as instructed.  SEEK IMMEDIATE MEDICAL CARE IF:    You have an increase in bruising, swelling, or pain.   Your pain has not improved with medicines.   You develop new shortness of breath, chest pain, or fever.   Problems seem to be getting worse rather than better.  MAKE SURE YOU:    Understand these instructions.   Will watch your condition.   Will get help right away if you are not doing well or get worse.  Document Released: 06/10/2003 Document Revised: 06/12/2011 Document Reviewed: 05/06/2010  [employer] Patient Information 2013 [employer].

## 2012-01-18 NOTE — Progress Notes (Signed)
  Subjective:    Patient ID: Stephanie Perkins, female    DOB: [birth year], 51 y.o.   MRN: [id number]  HPI Upper back pain near her shoulder blades for about 3-4 weeks. Has been using advil 600-800mg  with no relief.  Pain has progressively gotten more persistant.  Has tried heatin pad a night with no relief.  Advil no relief. No acute or old injuries.  Both sides.  No radiation. Feels tights. No throbbing.  No alleviating sxs.  No worsening sxs.  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 She's tried weaning her computer around. She still is not getting any significant relief.   Review of Systems     Objective:   Physical Exam  Constitutional: She appears well-developed and well-nourished.  HENT:  Head: Normocephalic and atraumatic.  Musculoskeletal:       Normal flexion, extension, rotation, side bending of both sides of the upper spine. She is tender over the paraspinous muscles of the mid thoracic spine just beneath her shoulder blades. Normal range of motion of her neck and her shoulders.  Skin: Skin is warm and dry.  Psychiatric: She has a normal mood and affect. Her behavior is normal.          Assessment & Plan:  Upper back pain-discussed I do think that this is more muscular in nature based on her areas of pain and tenderness. It actually felt good to her falls palpating her back. I recommended she may want to look into getting a massage. Also discussed a trial of muscle relaxers in addition to some range of motion stretches. Continue the anti-inflammatory with food and water to avoid GI irritation. Also give her prescription for tramadol to use as needed for pain relief. Given injection of Toradol here in the office for acute relief of her back pain. If not significantly better in 2-3 weeks then please call the office back. Because she is Re: had pain for a month at this point, would like to also get an x-ray today. Note, no pain radiating into her abdomen she's not improving then consider  additional workup with more internal problems.

## 2012-02-12 ENCOUNTER — Other Ambulatory Visit: Payer: Self-pay | Admitting: *Deleted

## 2012-02-12 MED ORDER — LISINOPRIL-HYDROCHLOROTHIAZIDE 20-12.5 MG PO TABS: 1.0000 | ORAL_TABLET | Freq: Every day | ORAL | Status: DC

## 2012-02-14 ENCOUNTER — Telehealth: Payer: Self-pay

## 2012-02-14 NOTE — Telephone Encounter (Signed)
Stephanie Perkins states her back pain is not better. She is calling her Neurologist because she believes the upper back pain could be related to the numbness in her hands. The Neurologist did recommend a MRI at her last visit.

## 2012-02-14 NOTE — Telephone Encounter (Signed)
Ok sounds like a good plan.

## 2012-02-18 ENCOUNTER — Other Ambulatory Visit: Payer: Self-pay | Admitting: Family Medicine

## 2012-06-05 ENCOUNTER — Other Ambulatory Visit: Payer: Self-pay | Admitting: *Deleted

## 2012-06-12 ENCOUNTER — Other Ambulatory Visit: Payer: Self-pay | Admitting: Family Medicine

## 2012-06-13 ENCOUNTER — Other Ambulatory Visit: Payer: Self-pay | Admitting: Family Medicine

## 2012-06-15 ENCOUNTER — Other Ambulatory Visit: Payer: Self-pay | Admitting: Family Medicine

## 2012-09-07 ENCOUNTER — Other Ambulatory Visit: Payer: Self-pay | Admitting: Family Medicine

## 2012-11-15 ENCOUNTER — Ambulatory Visit (INDEPENDENT_AMBULATORY_CARE_PROVIDER_SITE_OTHER): Payer: Managed Care, Other (non HMO) | Admitting: Family Medicine

## 2012-11-15 ENCOUNTER — Encounter (HOSPITAL_BASED_OUTPATIENT_CLINIC_OR_DEPARTMENT_OTHER): Payer: Self-pay

## 2012-11-15 ENCOUNTER — Emergency Department (HOSPITAL_BASED_OUTPATIENT_CLINIC_OR_DEPARTMENT_OTHER): Payer: Managed Care, Other (non HMO)

## 2012-11-15 ENCOUNTER — Encounter: Payer: Self-pay | Admitting: Family Medicine

## 2012-11-15 ENCOUNTER — Emergency Department (HOSPITAL_BASED_OUTPATIENT_CLINIC_OR_DEPARTMENT_OTHER)
Admission: EM | Admit: 2012-11-15 | Discharge: 2012-11-15 | Disposition: A | Discharge status: Home / Self Care | Payer: Managed Care, Other (non HMO) | Attending: Emergency Medicine | Admitting: Emergency Medicine

## 2012-11-15 ENCOUNTER — Ambulatory Visit (INDEPENDENT_AMBULATORY_CARE_PROVIDER_SITE_OTHER): Payer: Managed Care, Other (non HMO)

## 2012-11-15 VITALS — BP 123/76 | HR 79 | Temp 98.2°F | Ht 66.0 in | Wt 144.0 lb

## 2012-11-15 DIAGNOSIS — F3289 Other specified depressive episodes: Secondary | ICD-10-CM | POA: Insufficient documentation

## 2012-11-15 DIAGNOSIS — G35 Multiple sclerosis: Secondary | ICD-10-CM | POA: Insufficient documentation

## 2012-11-15 DIAGNOSIS — Z79899 Other long term (current) drug therapy: Secondary | ICD-10-CM | POA: Insufficient documentation

## 2012-11-15 DIAGNOSIS — F17200 Nicotine dependence, unspecified, uncomplicated: Secondary | ICD-10-CM | POA: Insufficient documentation

## 2012-11-15 DIAGNOSIS — R1084 Generalized abdominal pain: Secondary | ICD-10-CM

## 2012-11-15 DIAGNOSIS — K5289 Other specified noninfective gastroenteritis and colitis: Secondary | ICD-10-CM | POA: Insufficient documentation

## 2012-11-15 DIAGNOSIS — E785 Hyperlipidemia, unspecified: Secondary | ICD-10-CM | POA: Insufficient documentation

## 2012-11-15 DIAGNOSIS — F329 Major depressive disorder, single episode, unspecified: Secondary | ICD-10-CM | POA: Insufficient documentation

## 2012-11-15 DIAGNOSIS — R112 Nausea with vomiting, unspecified: Secondary | ICD-10-CM

## 2012-11-15 DIAGNOSIS — Z880 Allergy status to penicillin: Secondary | ICD-10-CM | POA: Insufficient documentation

## 2012-11-15 DIAGNOSIS — K529 Noninfective gastroenteritis and colitis, unspecified: Secondary | ICD-10-CM

## 2012-11-15 DIAGNOSIS — I10 Essential (primary) hypertension: Secondary | ICD-10-CM | POA: Insufficient documentation

## 2012-11-15 HISTORY — DX: Multiple sclerosis: G35

## 2012-11-15 LAB — COMPREHENSIVE METABOLIC PANEL
ALT: 7 U/L (ref 0–35)
BUN: 9 mg/dL (ref 6–23)
CO2: 25 mEq/L (ref 19–32)
Calcium: 10 mg/dL (ref 8.4–10.5)
Creatinine, Ser: 0.5 mg/dL (ref 0.50–1.10)
GFR calc Af Amer: >90 mL/min (ref >90)
GFR calc non Af Amer: >90 mL/min (ref >90)
Glucose, Bld: 111 mg/dL — ABNORMAL HIGH (ref 70–99)
Sodium: 135 mEq/L (ref 135–145)
Total Protein: 7.1 g/dL (ref 6.0–8.3)

## 2012-11-15 LAB — URINALYSIS, ROUTINE W REFLEX MICROSCOPIC
Bilirubin Urine: NEGATIVE
Nitrite: NEGATIVE
Specific Gravity, Urine: 1.007 (ref 1.005–1.030)
Urobilinogen, UA: 1 mg/dL (ref 0.0–1.0)

## 2012-11-15 LAB — URINE MICROSCOPIC-ADD ON

## 2012-11-15 LAB — CBC WITH DIFFERENTIAL/PLATELET
Eosinophils Absolute: 0.2 10*3/uL (ref 0.0–0.7)
Eosinophils Relative: 3 % (ref 0–5)
HCT: 41.1 % (ref 36.0–46.0)
Lymphocytes Relative: 29 % (ref 12–46)
Lymphs Abs: 2.8 10*3/uL (ref 0.7–4.0)
MCH: 27 pg (ref 26.0–34.0)
MCV: 80 fL (ref 78.0–100.0)
Monocytes Absolute: 1 10*3/uL (ref 0.1–1.0)
Monocytes Relative: 10 % (ref 3–12)
RBC: 5.14 MIL/uL — ABNORMAL HIGH (ref 3.87–5.11)
WBC: 9.7 10*3/uL (ref 4.0–10.5)

## 2012-11-15 LAB — LIPASE, BLOOD: Lipase: 21 U/L (ref 11–59)

## 2012-11-15 MED ORDER — METRONIDAZOLE 500 MG PO TABS: 500.0000 mg | ORAL_TABLET | Freq: Two times a day (BID) | ORAL | Status: DC

## 2012-11-15 MED ORDER — METRONIDAZOLE 500 MG PO TABS
500.0000 mg | ORAL_TABLET | Freq: Once | ORAL | Status: AC
  Administered 2012-11-15: 500 mg via ORAL
  Filled 2012-11-15: qty 1

## 2012-11-15 MED ORDER — CIPROFLOXACIN HCL 500 MG PO TABS: 500.0000 mg | ORAL_TABLET | Freq: Two times a day (BID) | ORAL | Status: DC

## 2012-11-15 MED ORDER — OXYCODONE-ACETAMINOPHEN 5-325 MG PO TABS: 2.0000 | ORAL_TABLET | ORAL | Status: DC | PRN

## 2012-11-15 MED ORDER — IOHEXOL 300 MG/ML  SOLN
50.0000 mL | Freq: Once | INTRAMUSCULAR | Status: AC | PRN
  Administered 2012-11-15: 50 mL via ORAL

## 2012-11-15 MED ORDER — CIPROFLOXACIN HCL 500 MG PO TABS
500.0000 mg | ORAL_TABLET | Freq: Once | ORAL | Status: AC
  Administered 2012-11-15: 500 mg via ORAL
  Filled 2012-11-15: qty 1

## 2012-11-15 MED ORDER — ONDANSETRON 8 MG PO TBDP: ORAL_TABLET | ORAL | Status: DC

## 2012-11-15 MED ORDER — SODIUM CHLORIDE 0.9 % IV BOLUS (SEPSIS)
1000.0000 mL | Freq: Once | INTRAVENOUS | Status: AC
  Administered 2012-11-15: 1000 mL via INTRAVENOUS

## 2012-11-15 MED ORDER — IOHEXOL 300 MG/ML  SOLN
100.0000 mL | Freq: Once | INTRAMUSCULAR | Status: AC | PRN
  Administered 2012-11-15: 100 mL via INTRAVENOUS

## 2012-11-15 NOTE — Progress Notes (Addendum)
Subjective:    Patient ID: Stephanie Perkins, female    DOB: Jun 27, 1960, 52 y.o.   MRN: [id number]  HPI Started having abdominal pain on Wednesday after eating lunch. She did vomit the next day no diarrhea. She's had some sharp intermittent epigastric and midabdominal pains. No fever, chills, or sweats. Didn't eat out that day. Never had this before. No nausea.  Dec appetite. Eating hurst her stomach. Stomach has been making nosises. No blood in the stool. Has had a colonoscopy in the past. No dysphagia or urinary sxs.    Review of Systems BP 123/76  Pulse 79  Temp(Src) 98.2 F (36.8 C) (Oral)  Ht 5\' 6"  (1.676 m)  Wt 144 lb (65.318 kg)  BMI 23.25 kg/m2    Allergies  Allergen Reactions  . Penicillins     REACTION: hives    Past Medical History  Diagnosis Date  . Depression   . Hypertension   . Hyperlipidemia     Past Surgical History  Procedure Laterality Date  . Appendectomy  1-99  . Cesarean section  9-85  . Tubal ligation  2002  . Abdominal hysterectomy  12-09    for fibroids    History   Social History  . Marital Status: Married    Spouse Name: N/A    Number of Children: N/A  . Years of Education: N/A   Occupational History  . Not on file.   Social History Main Topics  . Smoking status: Current Every Day Smoker -- 0.50 packs/day    Types: Cigarettes  . Smokeless tobacco: Not on file  . Alcohol Use: No  . Drug Use: No  . Sexual Activity: Not on file   Other Topics Concern  . Not on file   Social History Narrative  . No narrative on file    Family History  Problem Relation Age of Onset  . Hyperlipidemia Mother   . Hypertension Mother   . Alcohol abuse Maternal Grandfather   . Cancer Maternal Grandfather     Outpatient Encounter Prescriptions as of 11/15/2012  Medication Sig Dispense Refill  . acyclovir (ZOVIRAX) 400 MG tablet Take 1 tablet (400 mg total) by mouth 2 (two) times daily.  180 tablet  3  . glatiramer (COPAXONE) 20 MG/ML injection  Inject 20 mg into the skin as directed.        Marland Kitchen lisinopril-hydrochlorothiazide (PRINZIDE,ZESTORETIC) 20-12.5 MG per tablet TAKE 1 TABLET BY MOUTH DAILY.  90 tablet  0  . [DISCONTINUED] cyanocobalamin (,VITAMIN B-12,) 1000 MCG/ML injection INJECT EVERY WEEK.  10 mL  0  . [DISCONTINUED] cyclobenzaprine (FLEXERIL) 10 MG tablet Take 1 tablet (10 mg total) by mouth at bedtime as needed for muscle spasms.  30 tablet  0  . [DISCONTINUED] lisinopril-hydrochlorothiazide (PRINZIDE,ZESTORETIC) 20-12.5 MG per tablet Take 1 tablet by mouth daily.  30 tablet  0  . [DISCONTINUED] lisinopril-hydrochlorothiazide (PRINZIDE,ZESTORETIC) 20-12.5 MG per tablet TAKE 1 TABLET BY MOUTH DAILY.  90 tablet  0  . [DISCONTINUED] rosuvastatin (CRESTOR) 40 MG tablet Take 20 mg by mouth daily.      . [DISCONTINUED] traMADol (ULTRAM) 50 MG tablet Take 1 tablet (50 mg total) by mouth every 8 (eight) hours as needed for pain.  30 tablet  0   Facility-Administered Encounter Medications as of 11/15/2012  Medication Dose Route Frequency Provider Last Rate Last Dose  . cyanocobalamin ((VITAMIN B-12)) injection 1,000 mcg  1,000 mcg Intramuscular Q30 days Seymour Bars, DO   1,000 mcg at 07/20/10 1548  Objective:   Physical Exam  Constitutional: She is oriented to person, place, and time. She appears well-developed and well-nourished.  HENT:  Head: Normocephalic and atraumatic.  Cardiovascular: Normal rate, regular rhythm and normal heart sounds.   Pulmonary/Chest: Effort normal and breath sounds normal.  Abdominal: Soft. Bowel sounds are normal. She exhibits no distension and no mass. There is tenderness. There is no rebound and no guarding.  Tender in the left upper quadrant in the left lower quadrant and tender around the umbilicus.  Neurological: She is alert and oriented to person, place, and time.  Skin: Skin is warm and dry.  Psychiatric: She has a normal mood and affect. Her behavior is normal.           Assessment & Plan:  Generalized abdominal pain -  Could be viral versus bacterial versus an ileus versus diverticulitis. The most reassuring thing is or sweats. She is also not had any diarrhea. She only vomited once and that was yesterday and has not vomited since. She actually has no nausea today which is reassuring.  Also no fever, chills.  We'll get a KUB as well as a CBC and amylase and lipase. If she feels like she suddenly getting worse then please let us know and we can get further imaging.  Discussed the importance of staying hydrated and following a liquid diet or brat diet.

## 2012-11-15 NOTE — ED Notes (Signed)
Patient reports that she was sent by her primary MD after xray today determined bowel obstruction. Reports that the pain started on Wednesday with 1 episode of vomiting and no bowel movement.

## 2012-11-15 NOTE — Patient Instructions (Signed)
B.R.A.T. Diet Your doctor has recommended the B.R.A.T. diet for you or your child until the condition improves. This is often used to help control diarrhea and vomiting symptoms. If you or your child can tolerate clear liquids, you may have:  Bananas.   Rice.   Applesauce.   Toast (and other simple starches such as crackers, potatoes, noodles).  Be sure to avoid dairy products, meats, and fatty foods until symptoms are better. Fruit juices such as apple, grape, and prune juice can make diarrhea worse. Avoid these. Continue this diet for 2 days or as instructed by your caregiver. Document Released: 03/20/2005 Document Revised: 03/09/2011 Document Reviewed: 09/06/2006 Exit[employer] Patient Information 2012 [employer]. 

## 2012-11-15 NOTE — ED Provider Notes (Signed)
CSN: [id number]     Arrival date & time 11/15/12  1735 History     First MD Initiated Contact with Patient 11/15/12 1750     Chief Complaint  Patient presents with  . Abdominal Pain   (Consider location/radiation/quality/duration/timing/severity/associated sxs/prior Treatment) HPI Comments: Patient is a two-day history of abdominal pain. It started Wednesday morning with a crampy pain in the middle of her abdomen. She sips only had some nausea and vomiting the next day. She's had no vomiting since then but states that her pain gets worse anytime she tries to eating anything. She denies any urinary symptoms. She states that since the pain started on Wednesday she's had no bowel movements and is not passing gas. She denies any known fevers. She saw her primary care physician earlier today and a KUB was done which showed an ileus versus early or partial small bowel obstruction. She was sent over here for further evaluation. She states the pain is constant but gets worse at times especially after she. She does have a history of a past open appendectomy and vaginal hysterectomy.  Patient is a 52 y.o. female presenting with abdominal pain.  Abdominal Pain Associated symptoms: nausea and vomiting   Associated symptoms: no chest pain, no chills, no cough, no diarrhea, no fatigue, no fever, no hematuria and no shortness of breath     Past Medical History  Diagnosis Date  . Depression   . Hypertension   . Hyperlipidemia   . Multiple sclerosis    Past Surgical History  Procedure Laterality Date  . Appendectomy  1-99  . Cesarean section  9-85  . Tubal ligation  2002  . Abdominal hysterectomy  12-09    for fibroids   Family History  Problem Relation Age of Onset  . Hyperlipidemia Mother   . Hypertension Mother   . Alcohol abuse Maternal Grandfather   . Cancer Maternal Grandfather    History  Substance Use Topics  . Smoking status: Current Every Day Smoker -- 0.50 packs/day    Types:  Cigarettes  . Smokeless tobacco: Not on file  . Alcohol Use: No   OB History   Grav Para Term Preterm Abortions TAB SAB Ect Mult Living                 Review of Systems  Constitutional: Negative for fever, chills, diaphoresis and fatigue.  HENT: Negative for congestion, rhinorrhea and sneezing.   Eyes: Negative.   Respiratory: Negative for cough, chest tightness and shortness of breath.   Cardiovascular: Negative for chest pain and leg swelling.  Gastrointestinal: Positive for nausea, vomiting and abdominal pain. Negative for diarrhea and blood in stool.  Genitourinary: Negative for frequency, hematuria, flank pain and difficulty urinating.  Musculoskeletal: Negative for back pain and arthralgias.  Skin: Negative for rash.  Neurological: Negative for dizziness, speech difficulty, weakness, numbness and headaches.    Allergies  Penicillins  Home Medications   Current Outpatient Rx  Name  Route  Sig  Dispense  Refill  . acyclovir (ZOVIRAX) 400 MG tablet   Oral   Take 1 tablet (400 mg total) by mouth 2 (two) times daily.   180 tablet   3   . ciprofloxacin (CIPRO) 500 MG tablet   Oral   Take 1 tablet (500 mg total) by mouth 2 (two) times daily. One po bid x 7 days   14 tablet   0   . glatiramer (COPAXONE) 20 MG/ML injection   Subcutaneous   Inject  20 mg into the skin as directed.           Marland Kitchen lisinopril-hydrochlorothiazide (PRINZIDE,ZESTORETIC) 20-12.5 MG per tablet      TAKE 1 TABLET BY MOUTH DAILY.   90 tablet   0   . metroNIDAZOLE (FLAGYL) 500 MG tablet   Oral   Take 1 tablet (500 mg total) by mouth 2 (two) times daily. One po bid x 7 days   14 tablet   0   . ondansetron (ZOFRAN ODT) 8 MG disintegrating tablet      8mg  ODT q4 hours prn nausea   20 tablet   0   . oxyCODONE-acetaminophen (PERCOCET) 5-325 MG per tablet   Oral   Take 2 tablets by mouth every 4 (four) hours as needed for pain.   20 tablet   0    BP 127/65  Pulse 81  Temp(Src) 99.4  F (37.4 C) (Oral)  Resp 16  SpO2 100% Physical Exam  Constitutional: She is oriented to person, place, and time. She appears well-developed and well-nourished.  HENT:  Head: Normocephalic and atraumatic.  Eyes: Pupils are equal, round, and reactive to light.  Neck: Normal range of motion. Neck supple.  Cardiovascular: Normal rate, regular rhythm and normal heart sounds.   Pulmonary/Chest: Effort normal and breath sounds normal. No respiratory distress. She has no wheezes. She has no rales. She exhibits no tenderness.  Abdominal: Soft. There is tenderness (Mild diffuse tenderness). There is no rebound and no guarding.  Hypoactive bowel sounds  Musculoskeletal: Normal range of motion. She exhibits no edema.  Lymphadenopathy:    She has no cervical adenopathy.  Neurological: She is alert and oriented to person, place, and time.  Skin: Skin is warm and dry. No rash noted.  Psychiatric: She has a normal mood and affect.    ED Course   Procedures (including critical care time)  Results for orders placed during the hospital encounter of 11/15/12  CBC WITH DIFFERENTIAL      Result Value Range   WBC 9.7  4.0 - 10.5 K/uL   RBC 5.14 (*) 3.87 - 5.11 MIL/uL   Hemoglobin 13.9  12.0 - 15.0 g/dL   HCT 91.4  78.2 - 95.6 %   MCV 80.0  78.0 - 100.0 fL   MCH 27.0  26.0 - 34.0 pg   MCHC 33.8  30.0 - 36.0 g/dL   RDW 21.3  08.6 - 57.8 %   Platelets 258  150 - 400 K/uL   Neutrophils Relative % 57  43 - 77 %   Neutro Abs 5.6  1.7 - 7.7 K/uL   Lymphocytes Relative 29  12 - 46 %   Lymphs Abs 2.8  0.7 - 4.0 K/uL   Monocytes Relative 10  3 - 12 %   Monocytes Absolute 1.0  0.1 - 1.0 K/uL   Eosinophils Relative 3  0 - 5 %   Eosinophils Absolute 0.2  0.0 - 0.7 K/uL   Basophils Relative 1  0 - 1 %   Basophils Absolute 0.1  0.0 - 0.1 K/uL  COMPREHENSIVE METABOLIC PANEL      Result Value Range   Sodium 135  135 - 145 mEq/L   Potassium 3.7  3.5 - 5.1 mEq/L   Chloride 97  96 - 112 mEq/L   CO2 25  19  - 32 mEq/L   Glucose, Bld 111 (*) 70 - 99 mg/dL   BUN 9  6 - 23 mg/dL   Creatinine, Ser  0.50  0.50 - 1.10 mg/dL   Calcium 09.8  8.4 - 11.9 mg/dL   Total Protein 7.1  6.0 - 8.3 g/dL   Albumin 4.2  3.5 - 5.2 g/dL   AST 12  0 - 37 U/L   ALT 7  0 - 35 U/L   Alkaline Phosphatase 56  39 - 117 U/L   Total Bilirubin 0.6  0.3 - 1.2 mg/dL   GFR calc non Af Amer >90  >90 mL/min   GFR calc Af Amer >90  >90 mL/min  URINALYSIS, ROUTINE W REFLEX MICROSCOPIC      Result Value Range   Color, Urine YELLOW  YELLOW   APPearance CLEAR  CLEAR   Specific Gravity, Urine 1.007  1.005 - 1.030   pH 6.0  5.0 - 8.0   Glucose, UA NEGATIVE  NEGATIVE mg/dL   Hgb urine dipstick MODERATE (*) NEGATIVE   Bilirubin Urine NEGATIVE  NEGATIVE   Ketones, ur 40 (*) NEGATIVE mg/dL   Protein, ur NEGATIVE  NEGATIVE mg/dL   Urobilinogen, UA 1.0  0.0 - 1.0 mg/dL   Nitrite NEGATIVE  NEGATIVE   Leukocytes, UA TRACE (*) NEGATIVE  LIPASE, BLOOD      Result Value Range   Lipase 21  11 - 59 U/L  URINE MICROSCOPIC-ADD ON      Result Value Range   Squamous Epithelial / LPF RARE  RARE   WBC, UA 0-2  <3 WBC/hpf   RBC / HPF 3-6  <3 RBC/hpf   Bacteria, UA RARE  RARE   Dg Abd 1 View  11/15/2012   CLINICAL DATA:  Abdominal pain. Nausea. Vomiting.  EXAM: ABDOMEN - 1 VIEW  COMPARISON:  None.  FINDINGS: Several mildly dilated small bowel loops are seen in the left lower quadrant and pelvis. There is a relative paucity of colonic gas. This could be due to a focal ileus or partial/early small bowel obstruction.  IMPRESSION: Mildly dilated small bowel loops in the left lower quadrant pelvis. Differential diagnosis includes focal ileus and partial/early small bowel obstruction.   Electronically Signed   By: Myles Rosenthal   On: 11/15/2012 14:43   Ct Abdomen Pelvis W Contrast  11/15/2012   CLINICAL DATA:  Abdominal pain.  Leukocytosis.  EXAM: CT ABDOMEN AND PELVIS WITH CONTRAST  TECHNIQUE: Multidetector CT imaging of the abdomen and pelvis was  performed using the standard protocol following bolus administration of intravenous contrast.  CONTRAST:  OMNIPAQUE IOHEXOL 300 MG/ML  SOLN  COMPARISON:  None.  FINDINGS: The abdominal parenchymal organs and gallbladder are normal in appearance. No evidence of hydronephrosis. No soft tissue masses or lymphadenopathy identified within the abdomen or pelvis.  Prior hysterectomy noted.  No adnexal masses are identified.  Moderate wall thickening is seen involving the distal small bowel including the terminal ileum. There is no evidence of colonic wall thickening. There is a small amount of free fluid in the pelvis. No evidence of abscess.  IMPRESSION: Moderate distal small bowel wall thickening and including the terminal ileum. This is suspicious for Crohn's disease, although the differential diagnosis also includes other infectious inflammatory etiologies.  Small amount of free fluid in pelvis. No evidence of abscess or bowel obstruction.   Electronically Signed   By: Myles Rosenthal   On: 11/15/2012 20:36      1. Ileitis     MDM  Patient inflammation of the distal small bowel and terminal ileum. I discussed the findings with the patient and she had an  imaging study several years ago that was suspicious for Crohn's disease. However when it was reviewed, she was told that she did not have this. She's not had any problems since. She's currently nontoxic appearing with no vomiting or fever. I felt she could likely be treated as an outpatient. She was given prescription for Cipro and Flagyl in case this is an infectious etiology. She was also given a prescription for Percocet and Zofran. She was advised to follow with her primary care physician on Monday and will need to get evaluated by GI. Advise her return if she has any worsening symptoms or the weekend.  Rolan Bucco, MD 11/15/12 2112

## 2012-11-15 NOTE — ED Notes (Signed)
Patient transported to CT 

## 2012-11-15 NOTE — ED Notes (Signed)
Pt is drinking PO contrast 

## 2012-11-15 NOTE — ED Notes (Signed)
Warm blanket provided.

## 2012-11-15 NOTE — ED Notes (Signed)
Pt resting comfortably; drinking contrast; awaiting CT.

## 2012-11-16 LAB — COMPLETE METABOLIC PANEL WITH GFR
ALT: 8 U/L (ref 0–35)
Albumin: 4.4 g/dL (ref 3.5–5.2)
Alkaline Phosphatase: 55 U/L (ref 39–117)
CO2: 26 mEq/L (ref 19–32)
GFR, Est Non African American: >89 mL/min
Glucose, Bld: 92 mg/dL (ref 70–99)
Potassium: 4.3 mEq/L (ref 3.5–5.3)
Sodium: 135 mEq/L (ref 135–145)
Total Bilirubin: 0.7 mg/dL (ref 0.3–1.2)
Total Protein: 6.8 g/dL (ref 6.0–8.3)

## 2012-11-16 LAB — CBC WITH DIFFERENTIAL/PLATELET
Basophils Relative: 1 % (ref 0–1)
Eosinophils Absolute: 0.2 10*3/uL (ref 0.0–0.7)
Hemoglobin: 14.2 g/dL (ref 12.0–15.0)
Lymphs Abs: 3 10*3/uL (ref 0.7–4.0)
Monocytes Relative: 8 % (ref 3–12)
Neutro Abs: 6.6 10*3/uL (ref 1.7–7.7)
Neutrophils Relative %: 61 % (ref 43–77)
Platelets: 301 10*3/uL (ref 150–400)
RBC: 5.25 MIL/uL — ABNORMAL HIGH (ref 3.87–5.11)

## 2012-11-20 ENCOUNTER — Telehealth: Payer: Self-pay

## 2012-11-20 NOTE — Telephone Encounter (Signed)
Patient called stated that she was called on Friday and told to go to the emergency room, she went stated that someone called her yesterday and left a vm but could not reach her but she stated that she is doing fine.

## 2012-11-25 NOTE — Telephone Encounter (Signed)
Please see note below. 

## 2012-11-25 NOTE — Telephone Encounter (Signed)
Ok , good. We were just calling to check on her.

## 2012-12-21 ENCOUNTER — Other Ambulatory Visit: Payer: Self-pay | Admitting: Family Medicine

## 2012-12-23 NOTE — Telephone Encounter (Signed)
She needs to have a b12 checked before we can refill it.

## 2012-12-23 NOTE — Telephone Encounter (Signed)
Pt calls back and states she thought she had a recent within last 6 months level drawn.  I called Dr. Renne Crigler office her neurologist and they will be faxing the last Vitamin B12 level to our office. Barry Dienes, LPN

## 2012-12-23 NOTE — Telephone Encounter (Signed)
Called pt and LMOM to return my call in regards to needs B12 level checked before refilling the Vitamin B12

## 2012-12-23 NOTE — Telephone Encounter (Signed)
Okay. Please enter the B12 into the system. I did go ahead and send in a refill for B12

## 2012-12-24 ENCOUNTER — Encounter: Payer: Self-pay | Admitting: *Deleted

## 2013-01-21 ENCOUNTER — Other Ambulatory Visit: Payer: Self-pay | Admitting: Family Medicine

## 2013-02-06 ENCOUNTER — Other Ambulatory Visit: Payer: Self-pay

## 2013-02-11 ENCOUNTER — Ambulatory Visit (INDEPENDENT_AMBULATORY_CARE_PROVIDER_SITE_OTHER): Payer: Managed Care, Other (non HMO) | Admitting: Family Medicine

## 2013-02-11 ENCOUNTER — Encounter: Payer: Self-pay | Admitting: Family Medicine

## 2013-02-11 VITALS — BP 142/84 | HR 78 | Temp 97.3°F | Ht 65.6 in | Wt 146.0 lb

## 2013-02-11 DIAGNOSIS — I10 Essential (primary) hypertension: Secondary | ICD-10-CM

## 2013-02-11 DIAGNOSIS — G35 Multiple sclerosis: Secondary | ICD-10-CM

## 2013-02-11 DIAGNOSIS — E785 Hyperlipidemia, unspecified: Secondary | ICD-10-CM

## 2013-02-11 LAB — BASIC METABOLIC PANEL WITHOUT GFR
BUN: 13 mg/dL (ref 6–23)
CO2: 24 meq/L (ref 19–32)
Calcium: 9.7 mg/dL (ref 8.4–10.5)
Chloride: 104 meq/L (ref 96–112)
Creat: 0.5 mg/dL (ref 0.50–1.10)
GFR, Est African American: >89 mL/min
GFR, Est Non African American: >89 mL/min
Glucose, Bld: 95 mg/dL (ref 70–99)
Potassium: 4 meq/L (ref 3.5–5.3)
Sodium: 140 meq/L (ref 135–145)

## 2013-02-11 LAB — HEPATIC FUNCTION PANEL
ALT: 25 U/L (ref 0–35)
AST: 19 U/L (ref 0–37)
Albumin: 4.6 g/dL (ref 3.5–5.2)
Alkaline Phosphatase: 46 U/L (ref 39–117)
Bilirubin, Direct: 0.1 mg/dL (ref 0.0–0.3)
Indirect Bilirubin: 0.6 mg/dL (ref 0.0–0.9)
Total Bilirubin: 0.7 mg/dL (ref 0.3–1.2)
Total Protein: 7.2 g/dL (ref 6.0–8.3)

## 2013-02-11 LAB — LIPID PANEL
Cholesterol: 342 mg/dL — ABNORMAL HIGH (ref 0–200)
HDL: 56 mg/dL
LDL Cholesterol: 265 mg/dL — ABNORMAL HIGH (ref 0–99)
Total CHOL/HDL Ratio: 6.1 ratio
Triglycerides: 105 mg/dL
VLDL: 21 mg/dL (ref 0–40)

## 2013-02-11 LAB — CBC WITH DIFFERENTIAL/PLATELET
Basophils Relative: 0 % (ref 0–1)
Eosinophils Absolute: 0.2 10*3/uL (ref 0.0–0.7)
Eosinophils Relative: 3 % (ref 0–5)
Hemoglobin: 14.4 g/dL (ref 12.0–15.0)
Lymphs Abs: 2.7 10*3/uL (ref 0.7–4.0)
MCH: 26.8 pg (ref 26.0–34.0)
MCHC: 33.8 g/dL (ref 30.0–36.0)
MCV: 79.2 fL (ref 78.0–100.0)
Monocytes Relative: 8 % (ref 3–12)
Platelets: 299 10*3/uL (ref 150–400)
RBC: 5.38 MIL/uL — ABNORMAL HIGH (ref 3.87–5.11)

## 2013-02-11 MED ORDER — ATORVASTATIN CALCIUM 40 MG PO TABS: 40.0000 mg | ORAL_TABLET | Freq: Every day | ORAL | Status: DC

## 2013-02-11 NOTE — Progress Notes (Signed)
Subjective:    Patient ID: Stephanie Perkins, female    DOB: 1961/02/07, 52 y.o.   MRN: [id number]  HPI MS - her neurologist wants to put her on Gilenya for her MS so needs some additional labs. Has to have eye appt as well to check for macular edema.  Has to have an EKG, but that will be done at [facility].  She has been having more weakness in the legs and fatigue.   HTN -  Pt denies chest pain, SOB, dizziness, or heart palpitations.  Taking meds as directed w/o problems.  Denies medication side effects.  BP has been running normal when sees neurology, OB/gyn and at work. She is actually missed a couple doses this week which she says is unusual for her.  Hyperlipidemia - Went off the crestor last year and was on livalo year when had labs checked at work. Off statin now and has recheck chol at work and TC was over 200.   Review of Systems  BP 142/84  Pulse 78  Temp(Src) 97.3 F (36.3 C)  Ht 5' 5.6" (1.666 m)  Wt 146 lb (66.225 kg)  BMI 23.86 kg/m2    Allergies  Allergen Reactions  . Crestor [Rosuvastatin] Other (See Comments)    FAtigue and myalgia  . Penicillins     REACTION: hives    Past Medical History  Diagnosis Date  . Depression   . Hypertension   . Hyperlipidemia   . Multiple sclerosis     Past Surgical History  Procedure Laterality Date  . Appendectomy  1-99  . Cesarean section  9-85  . Tubal ligation  2002  . Abdominal hysterectomy  12-09    for fibroids    History   Social History  . Marital Status: Married    Spouse Name: N/A    Number of Children: N/A  . Years of Education: N/A   Occupational History  . Not on file.   Social History Main Topics  . Smoking status: Current Every Day Smoker -- 0.50 packs/day    Types: Cigarettes  . Smokeless tobacco: Not on file  . Alcohol Use: No  . Drug Use: No  . Sexual Activity: Not on file   Other Topics Concern  . Not on file   Social History Narrative  . No narrative on file    Family History   Problem Relation Age of Onset  . Hyperlipidemia Mother   . Hypertension Mother   . Alcohol abuse Maternal Grandfather   . Cancer Maternal Grandfather     Outpatient Encounter Prescriptions as of 02/11/2013  Medication Sig  . acyclovir (ZOVIRAX) 400 MG tablet Take 1 tablet (400 mg total) by mouth 2 (two) times daily.  Marland Kitchen atorvastatin (LIPITOR) 40 MG tablet Take 1 tablet (40 mg total) by mouth at bedtime.  . ciprofloxacin (CIPRO) 500 MG tablet Take 1 tablet (500 mg total) by mouth 2 (two) times daily. One po bid x 7 days  . cyanocobalamin (,VITAMIN B-12,) 1000 MCG/ML injection INJECT EVERY WEEK. **ON BACKORDER TIL APRIL**  . cyanocobalamin (,VITAMIN B-12,) 1000 MCG/ML injection INJECT EVERY WEEK. **ON BACKORDER TIL APRIL**  . glatiramer (COPAXONE) 20 MG/ML injection Inject 20 mg into the skin as directed.    Marland Kitchen lisinopril-hydrochlorothiazide (PRINZIDE,ZESTORETIC) 20-12.5 MG per tablet TAKE 1 TABLET BY MOUTH DAILY.  . metroNIDAZOLE (FLAGYL) 500 MG tablet Take 1 tablet (500 mg total) by mouth 2 (two) times daily. One po bid x 7 days  .  ondansetron (ZOFRAN ODT) 8 MG disintegrating tablet 8mg  ODT q4 hours prn nausea  . oxyCODONE-acetaminophen (PERCOCET) 5-325 MG per tablet Take 2 tablets by mouth every 4 (four) hours as needed for pain.          Objective:   Physical Exam  Constitutional: She is oriented to person, place, and time. She appears well-developed and well-nourished.  HENT:  Head: Normocephalic and atraumatic.  Neck: Neck supple. No thyromegaly present.  Cardiovascular: Normal rate, regular rhythm and normal heart sounds.   No carotid bruits.   Pulmonary/Chest: Effort normal and breath sounds normal.  Musculoskeletal: She exhibits no edema.  Lymphadenopathy:    She has no cervical adenopathy.  Neurological: She is alert and oriented to person, place, and time.  Skin: Skin is warm and dry.  Psychiatric: She has a normal mood and affect. Her behavior is normal.           Assessment & Plan:  MS - Will get additional labwork done today. We can fax a copy to her neurologist wants the lab work is back. I really been advised if her about the medication as it is a fairly new drug and I am really not familiar with it. She does have a booklet with her today and has reviewed the potential side effects.  HTN - uncontrolled today but well controlled at home.  F/U in 6 mo. continue current regimen. Continue current regimen. Copious consistent with medication as possible.  Hyperlipiemia - Recheck lpids today and discussed trial of lipitor as weill be much cheaper than the Livalo.  Recommend she try for at least 3 months. Will send her mail-order. She doesn't tolerate well then we'll cut back the Livalo. Will likely just be more costly and will require a prior off her sedation since not on her formulary. She is Re: investigated this with the insurance and they are willing to accept a letter if needed.

## 2013-04-01 ENCOUNTER — Other Ambulatory Visit: Payer: Self-pay | Admitting: Family Medicine

## 2013-04-19 ENCOUNTER — Other Ambulatory Visit: Payer: Self-pay | Admitting: Family Medicine

## 2013-04-28 ENCOUNTER — Other Ambulatory Visit: Payer: Self-pay | Admitting: Family Medicine

## 2013-06-30 ENCOUNTER — Other Ambulatory Visit: Payer: Self-pay | Admitting: Family Medicine

## 2013-07-18 ENCOUNTER — Other Ambulatory Visit: Payer: Self-pay | Admitting: Family Medicine

## 2013-07-30 ENCOUNTER — Other Ambulatory Visit: Payer: Self-pay | Admitting: Family Medicine

## 2013-10-21 ENCOUNTER — Other Ambulatory Visit: Payer: Self-pay | Admitting: Family Medicine

## 2013-10-28 ENCOUNTER — Other Ambulatory Visit: Payer: Self-pay | Admitting: Family Medicine

## 2013-11-20 ENCOUNTER — Encounter: Payer: Self-pay | Admitting: Family Medicine

## 2013-11-20 ENCOUNTER — Ambulatory Visit (INDEPENDENT_AMBULATORY_CARE_PROVIDER_SITE_OTHER): Payer: Managed Care, Other (non HMO) | Admitting: Family Medicine

## 2013-11-20 VITALS — BP 132/68 | HR 81 | Wt 146.0 lb

## 2013-11-20 DIAGNOSIS — I10 Essential (primary) hypertension: Secondary | ICD-10-CM

## 2013-11-20 DIAGNOSIS — Z720 Tobacco use: Secondary | ICD-10-CM

## 2013-11-20 DIAGNOSIS — F17200 Nicotine dependence, unspecified, uncomplicated: Secondary | ICD-10-CM

## 2013-11-20 DIAGNOSIS — E785 Hyperlipidemia, unspecified: Secondary | ICD-10-CM

## 2013-11-20 MED ORDER — LISINOPRIL-HYDROCHLOROTHIAZIDE 20-12.5 MG PO TABS: ORAL_TABLET | ORAL | Status: DC

## 2013-11-20 NOTE — Progress Notes (Signed)
   Subjective:    Patient ID: KEYNA FALLEN, female    DOB: [birth year], 53 y.o.   MRN: [id number]  Hypertension   Hypertension- Pt denies chest pain, SOB, dizziness, or heart palpitations.  Taking meds as directed w/o problems.  Denies medication side effects.    Hyperlipidemia - She is taking her statin regularly. Last check her cholesterol in November and it was significantly elevated. She started the medication at that time and has tolerated it well without any side effects.   Review of Systems     Objective:   Physical Exam  Constitutional: She is oriented to person, place, and time. She appears well-developed and well-nourished.  HENT:  Head: Normocephalic and atraumatic.  Cardiovascular: Normal rate, regular rhythm and normal heart sounds.   Pulmonary/Chest: Effort normal and breath sounds normal.  Neurological: She is alert and oriented to person, place, and time.  Skin: Skin is warm and dry.  Psychiatric: She has a normal mood and affect. Her behavior is normal.          Assessment & Plan:  Hypertension-well-controlled on current regimen. Followup in 6 months. Due for CMP.  Lipidemia-tolerating statin well. Due to repeat lipids and liver enzymes.  Tobacco abuse-encourage cessation. She is not ready to quit.

## 2013-11-21 LAB — COMPLETE METABOLIC PANEL WITH GFR
ALBUMIN: 4.8 g/dL (ref 3.5–5.2)
ALT: 32 U/L (ref 0–35)
AST: 22 U/L (ref 0–37)
Alkaline Phosphatase: 52 U/L (ref 39–117)
BUN: 14 mg/dL (ref 6–23)
CALCIUM: 9.6 mg/dL (ref 8.4–10.5)
CHLORIDE: 102 meq/L (ref 96–112)
CO2: 26 mEq/L (ref 19–32)
Creat: 0.56 mg/dL (ref 0.50–1.10)
GFR, Est African American: >89 mL/min
GLUCOSE: 105 mg/dL — AB (ref 70–99)
POTASSIUM: 4 meq/L (ref 3.5–5.3)
SODIUM: 139 meq/L (ref 135–145)
TOTAL PROTEIN: 6.8 g/dL (ref 6.0–8.3)
Total Bilirubin: 0.6 mg/dL (ref 0.2–1.2)

## 2013-11-21 LAB — LIPID PANEL
CHOLESTEROL: 161 mg/dL (ref 0–200)
HDL: 63 mg/dL (ref >39)
LDL Cholesterol: 87 mg/dL (ref 0–99)
Total CHOL/HDL Ratio: 2.6 Ratio
Triglycerides: 57 mg/dL (ref <150)
VLDL: 11 mg/dL (ref 0–40)

## 2014-01-26 ENCOUNTER — Other Ambulatory Visit: Payer: Self-pay | Admitting: Family Medicine

## 2014-02-25 ENCOUNTER — Other Ambulatory Visit: Payer: Self-pay | Admitting: Physician Assistant

## 2014-03-10 ENCOUNTER — Telehealth: Payer: Self-pay | Admitting: *Deleted

## 2014-03-10 MED ORDER — ATORVASTATIN CALCIUM 40 MG PO TABS: 40.0000 mg | ORAL_TABLET | Freq: Every day | ORAL | Status: DC

## 2014-03-10 NOTE — Telephone Encounter (Signed)
Pt called and stated that her Rx for Lipitor was denied. Looked back at her last OV and labs. Dr Linford Arnoldmetheney recommended that she continue on current regimen. I apologized for the inconvenience.Laureen Ochs.Shandel Busic, Viann Shoveonya Lynetta

## 2014-05-17 ENCOUNTER — Other Ambulatory Visit: Payer: Self-pay | Admitting: Family Medicine

## 2014-06-08 ENCOUNTER — Other Ambulatory Visit: Payer: Self-pay | Admitting: Family Medicine

## 2014-06-15 ENCOUNTER — Other Ambulatory Visit: Payer: Self-pay | Admitting: *Deleted

## 2014-06-15 MED ORDER — LISINOPRIL-HYDROCHLOROTHIAZIDE 20-12.5 MG PO TABS: 1.0000 | ORAL_TABLET | Freq: Every day | ORAL | Status: DC

## 2014-06-15 NOTE — Telephone Encounter (Signed)
Pt called requesting refill of bp  Med. She informed me that due to her MS she has been out of work and is unable to get in right now. I informed her that I  Would send this rx this time however, she will need to schedule a f/u appt. She voiced understanding and agreed.Loralee Pacas [place]

## 2014-10-13 ENCOUNTER — Encounter: Payer: Self-pay | Admitting: Family Medicine

## 2014-10-13 ENCOUNTER — Ambulatory Visit (INDEPENDENT_AMBULATORY_CARE_PROVIDER_SITE_OTHER): Payer: 59 | Admitting: Family Medicine

## 2014-10-13 VITALS — BP 136/80 | HR 71 | Ht 66.0 in | Wt 166.0 lb

## 2014-10-13 DIAGNOSIS — E785 Hyperlipidemia, unspecified: Secondary | ICD-10-CM | POA: Diagnosis not present

## 2014-10-13 DIAGNOSIS — Z114 Encounter for screening for human immunodeficiency virus [HIV]: Secondary | ICD-10-CM | POA: Diagnosis not present

## 2014-10-13 DIAGNOSIS — Z1159 Encounter for screening for other viral diseases: Secondary | ICD-10-CM | POA: Diagnosis not present

## 2014-10-13 DIAGNOSIS — I10 Essential (primary) hypertension: Secondary | ICD-10-CM

## 2014-10-13 DIAGNOSIS — D510 Vitamin B12 deficiency anemia due to intrinsic factor deficiency: Secondary | ICD-10-CM

## 2014-10-13 MED ORDER — ATORVASTATIN CALCIUM 40 MG PO TABS: 40.0000 mg | ORAL_TABLET | Freq: Every day | ORAL | Status: DC

## 2014-10-13 MED ORDER — LISINOPRIL-HYDROCHLOROTHIAZIDE 20-12.5 MG PO TABS: 1.0000 | ORAL_TABLET | Freq: Every day | ORAL | Status: DC

## 2014-10-13 MED ORDER — CYANOCOBALAMIN 1000 MCG/ML IJ SOLN: INTRAMUSCULAR | Status: DC

## 2014-10-13 NOTE — Progress Notes (Signed)
   Subjective:    Patient ID: Stephanie Perkins, female    DOB: Jan 07, 1961, 54 y.o.   MRN: [id number]  HPI Hypertension- Pt denies chest pain, SOB, dizziness, or heart palpitations.  Taking meds as directed w/o problems.  Denies medication side effects.    Hyperlipidemia-currently on atorvastatin 40 mg. No side effects or myalgias.  MS - had a flare in march in April and was out of work.  She did want to update me and let me know that she has a new neurologist now. She is seeing Dr. Cyril Loosen at [facility].  Pernicious anemia-she is doing her B12 injections at home on her own monthly. She does need a refill on the prescription.  Review of Systems     Objective:   Physical Exam  Constitutional: She is oriented to person, place, and time. She appears well-developed and well-nourished.  HENT:  Head: Normocephalic and atraumatic.  Cardiovascular: Normal rate, regular rhythm and normal heart sounds.   Pulmonary/Chest: Effort normal and breath sounds normal.  Neurological: She is alert and oriented to person, place, and time.  Skin: Skin is warm and dry.  Psychiatric: She has a normal mood and affect. Her behavior is normal.          Assessment & Plan:  Hypertension-well-controlled. Continue current regimen. Follow up in 6 months. Next  Hyperlipidemia-due to recheck lipids and CMP. Continue atorvastatin 40 mg.  Multiple sclerosis-being followed by neurology.  Pernicious anemia-refilled the B12. She needs to get baseline drug levels unexplained the importance of having that level monitored periodically so that we can adjust her regimen as needed.  Call Dr. Gaye Alken offices for last pap and mammogram.

## 2014-10-21 LAB — LIPID PANEL
Cholesterol: 203 mg/dL — ABNORMAL HIGH (ref 0–200)
HDL: 63 mg/dL (ref >=46)
LDL Cholesterol: 126 mg/dL — ABNORMAL HIGH (ref 0–99)
TRIGLYCERIDES: 69 mg/dL (ref <150)
Total CHOL/HDL Ratio: 3.2 Ratio
VLDL: 14 mg/dL (ref 0–40)

## 2014-10-21 LAB — COMPLETE METABOLIC PANEL WITH GFR
ALT: 24 U/L (ref 0–35)
AST: 21 U/L (ref 0–37)
Albumin: 4.4 g/dL (ref 3.5–5.2)
Alkaline Phosphatase: 61 U/L (ref 39–117)
BUN: 13 mg/dL (ref 6–23)
CALCIUM: 9.6 mg/dL (ref 8.4–10.5)
CHLORIDE: 102 meq/L (ref 96–112)
CO2: 26 mEq/L (ref 19–32)
Creat: 0.46 mg/dL — ABNORMAL LOW (ref 0.50–1.10)
GFR, Est African American: >89 mL/min
GFR, Est Non African American: >89 mL/min
GLUCOSE: 96 mg/dL (ref 70–99)
POTASSIUM: 4.1 meq/L (ref 3.5–5.3)
Sodium: 141 mEq/L (ref 135–145)
Total Bilirubin: 0.7 mg/dL (ref 0.2–1.2)
Total Protein: 7 g/dL (ref 6.0–8.3)

## 2014-10-21 LAB — HIV ANTIBODY (ROUTINE TESTING W REFLEX): HIV: NONREACTIVE

## 2014-10-21 LAB — VITAMIN B12: VITAMIN B 12: 397 pg/mL (ref 211–911)

## 2014-10-22 LAB — HEPATITIS C ANTIBODY: HCV Ab: NEGATIVE

## 2015-01-26 LAB — HM MAMMOGRAPHY

## 2015-03-28 ENCOUNTER — Other Ambulatory Visit: Payer: Self-pay | Admitting: Family Medicine

## 2015-05-10 ENCOUNTER — Ambulatory Visit (INDEPENDENT_AMBULATORY_CARE_PROVIDER_SITE_OTHER): Payer: 59 | Admitting: Osteopathic Medicine

## 2015-05-10 ENCOUNTER — Telehealth: Payer: Self-pay

## 2015-05-10 ENCOUNTER — Encounter: Payer: Self-pay | Admitting: Osteopathic Medicine

## 2015-05-10 VITALS — BP 124/66 | HR 100 | Temp 98.3°F | Ht 66.0 in | Wt 180.0 lb

## 2015-05-10 DIAGNOSIS — J069 Acute upper respiratory infection, unspecified: Secondary | ICD-10-CM | POA: Diagnosis not present

## 2015-05-10 DIAGNOSIS — B9789 Other viral agents as the cause of diseases classified elsewhere: Principal | ICD-10-CM

## 2015-05-10 MED ORDER — LISINOPRIL-HYDROCHLOROTHIAZIDE 20-12.5 MG PO TABS: 1.0000 | ORAL_TABLET | Freq: Every day | ORAL | Status: DC

## 2015-05-10 MED ORDER — HYDROCODONE-HOMATROPINE 5-1.5 MG/5ML PO SYRP: 5.0000 mL | ORAL_SOLUTION | Freq: Four times a day (QID) | ORAL | Status: DC | PRN

## 2015-05-10 MED ORDER — IPRATROPIUM BROMIDE 0.03 % NA SOLN: 2.0000 | Freq: Three times a day (TID) | NASAL | Status: DC | PRN

## 2015-05-10 NOTE — Patient Instructions (Signed)
Would add antihistamine such as Zyrtec or Claritin or Allegra. We calld in nasal spray to help with congestion. We have you prescription for strong cough medicine. Call us if you are not better 1 week after you started to get sick. Follow up as directed with Dr Linford Arnold.

## 2015-05-10 NOTE — Progress Notes (Signed)
HPI: Stephanie Perkins is a 55 y.o. female who presents to [facility]  today for chief complaint of:  Chief Complaint  Patient presents with  . Cough  . Fever   . Location: chest  . Quality: pressure, cough . Duration: 3-5 days  Context: (+) sick contacts with husband . Modifying factors: has tried the following OTC medications: Advil Sinus (Ibuprofen, Phenylephrine), Coricidin (Guaifenisen, Dextromethorphan)  with relief  Past medical, social and family history reviewed: Past Medical History  Diagnosis Date  . Depression   . Hypertension   . Hyperlipidemia   . Multiple sclerosis [facility])    Past Surgical History  Procedure Laterality Date  . Appendectomy  1-99  . Cesarean section  9-85  . Tubal ligation  2002  . Abdominal hysterectomy  12-09    for fibroids   Social History  Substance Use Topics  . Smoking status: Current Every Day Smoker -- 0.50 packs/day    Types: Cigarettes  . Smokeless tobacco: Not on file  . Alcohol Use: No   Family History  Problem Relation Age of Onset  . Hyperlipidemia Mother   . Hypertension Mother   . Alcohol abuse Maternal Grandfather   . Cancer Maternal Grandfather     Current Outpatient Prescriptions  Medication Sig Dispense Refill  . atorvastatin (LIPITOR) 40 MG tablet Take 1 tablet (40 mg total) by mouth at bedtime. 90 tablet 3  . COPAXONE 40 MG/ML SOSY     . cyanocobalamin (,VITAMIN B-12,) 1000 MCG/ML injection INJECT EVERY MONTH. 1 mL 11  . lisinopril-hydrochlorothiazide (PRINZIDE,ZESTORETIC) 20-12.5 MG tablet Take 1 tablet by mouth daily. NEED FOLLOW UP APPOINTMENT FOR MORE REFILLS 15 tablet 0  . modafinil (PROVIGIL) 200 MG tablet TAKE 1-2 TABLETS BY MOUTH AS NEEDED FOR FAUTIGUE  3   No current facility-administered medications for this visit.   Allergies  Allergen Reactions  . Crestor [Rosuvastatin] Other (See Comments)    FAtigue and myalgia  . Penicillins     REACTION: hives       Review of Systems: CONSTITUTIONAL: yes fever/chills HEAD/EYES/EARS/NOSE/THROAT: yes headache, no vision change or hearing change, yes sore throat CARDIAC: No chest pain/pressure/palpitations, no orthopnea RESPIRATORY: yes cough, no shortness of breath GASTROINTESTINAL: no nausea, no vomiting, no abdominal pain/blood in stool/diarrhea/constipation MUSCULOSKELETAL: yes myalgia/arthralgia   Exam:  BP 124/66 mmHg  Pulse 100  Temp(Src) 98.3 F (36.8 C) (Oral)  Ht 5\' 6"  (1.676 m)  Wt 180 lb (81.647 kg)  BMI 29.07 kg/m2 Constitutional: VSS, see above. General Appearance: alert, well-developed, well-nourished, NAD Eyes: Normal lids and conjunctive, non-icteric sclera, PERRLA Ears, Nose, Mouth, Throat: Normal external inspection ears/nares/mouth/lips/gums, normal TM, MMM; posterior pharynx without erythema, without exudate Neck: No masses, trachea midline. No thyroid enlargement/tenderness/mass appreciated, normal lymph nodes Respiratory: Normal respiratory effort. No  wheeze/rhonchi/rales Cardiovascular: S1/S2 normal, no murmur/rub/gallop auscultated. RRR. No carotid bruit or JVD. No lower extremity edema.   No results found for this or any previous visit (from the past 72 hour(s)).    ASSESSMENT/PLAN: See pt instructions. Supportive care and measures to prevent bacterial sinusitis were reviewed.   Viral URI with cough - Plan: HYDROcodone-homatropine (HYCODAN) 5-1.5 MG/5ML syrup, ipratropium (ATROVENT) 0.03 % nasal spray    Return if symptoms worsen or fail to improve.

## 2015-05-10 NOTE — Telephone Encounter (Signed)
Patient was seen in office today for and acute problem, she requested a refill on Lisinopril. I advised patient that I  could send in a 2 week supply and advised her to schedule a follow up with PCP for further refills. Quron Ruddy,CMA

## 2015-05-17 DIAGNOSIS — R270 Ataxia, unspecified: Secondary | ICD-10-CM | POA: Insufficient documentation

## 2015-05-17 DIAGNOSIS — Z5181 Encounter for therapeutic drug level monitoring: Secondary | ICD-10-CM | POA: Insufficient documentation

## 2015-05-17 DIAGNOSIS — Z79899 Other long term (current) drug therapy: Secondary | ICD-10-CM

## 2015-05-17 DIAGNOSIS — R5383 Other fatigue: Secondary | ICD-10-CM | POA: Insufficient documentation

## 2015-05-20 ENCOUNTER — Ambulatory Visit (INDEPENDENT_AMBULATORY_CARE_PROVIDER_SITE_OTHER): Payer: 59 | Admitting: Family Medicine

## 2015-05-20 ENCOUNTER — Encounter: Payer: Self-pay | Admitting: Family Medicine

## 2015-05-20 VITALS — BP 130/64 | HR 88 | Wt 178.9 lb

## 2015-05-20 DIAGNOSIS — I10 Essential (primary) hypertension: Secondary | ICD-10-CM | POA: Diagnosis not present

## 2015-05-20 DIAGNOSIS — E538 Deficiency of other specified B group vitamins: Secondary | ICD-10-CM | POA: Diagnosis not present

## 2015-05-20 DIAGNOSIS — G35 Multiple sclerosis: Secondary | ICD-10-CM | POA: Diagnosis not present

## 2015-05-20 MED ORDER — LISINOPRIL-HYDROCHLOROTHIAZIDE 20-12.5 MG PO TABS: 1.0000 | ORAL_TABLET | Freq: Every day | ORAL | Status: DC

## 2015-05-20 NOTE — Progress Notes (Signed)
   Subjective:    Patient ID: Stephanie Perkins, female    DOB: March 24, 1961, 55 y.o.   MRN: [id number]  HPI Hypertension- Pt denies chest pain, SOB, dizziness, or heart palpitations.  Taking meds as directed w/o problems.  Denies medication side effects.    B12 def - monthly injections. Her last injection was about a month ago. She feels like her energy level is low.   MS - they are considering new medication. She is getting up to date MRI.   She changed provider and is seeing Dr. Daphane Shepherd now. She has felt extremely fatigued but thinks it's probably from her MS.    Review of Systems     Objective:   Physical Exam  Constitutional: She is oriented to person, place, and time. She appears well-developed and well-nourished.  HENT:  Head: Normocephalic and atraumatic.  Neck: Neck supple. No thyromegaly present.  Cardiovascular: Normal rate, regular rhythm and normal heart sounds.   Pulmonary/Chest: Effort normal and breath sounds normal.  Lymphadenopathy:    She has no cervical adenopathy.  Neurological: She is alert and oriented to person, place, and time.  Skin: Skin is warm and dry.  Psychiatric: She has a normal mood and affect. Her behavior is normal.          Assessment & Plan:  HTN - well controled. Due for BMP. F/U in 6 months.   B12 - her last B12 looks great. But I offered to recheck again today in case her levels are dropping down in which case she might need to give her injections more frequently but she declined.   MS - following with neurology. She may actually consider changing medications and she hasn't been doing as well on the Copaxone.  Will call Dr. Gaye Alken office at [facility] to get a copy of Pap smear and mammogram. -

## 2015-05-21 LAB — BASIC METABOLIC PANEL
BUN: 15 mg/dL (ref 7–25)
CALCIUM: 9.1 mg/dL (ref 8.6–10.4)
CO2: 28 mmol/L (ref 20–31)
Chloride: 101 mmol/L (ref 98–110)
Creat: 0.59 mg/dL (ref 0.50–1.05)
GLUCOSE: 82 mg/dL (ref 65–99)
Potassium: 4.2 mmol/L (ref 3.5–5.3)
Sodium: 135 mmol/L (ref 135–146)

## 2015-05-21 NOTE — Progress Notes (Signed)
Quick Note:  All labs are normal. ______ 

## 2015-05-25 ENCOUNTER — Other Ambulatory Visit: Payer: Self-pay | Admitting: Family Medicine

## 2015-05-25 DIAGNOSIS — G35 Multiple sclerosis: Secondary | ICD-10-CM

## 2015-05-30 ENCOUNTER — Other Ambulatory Visit: Payer: Self-pay | Admitting: Family Medicine

## 2015-06-01 ENCOUNTER — Encounter: Payer: Self-pay | Admitting: Family Medicine

## 2015-06-02 ENCOUNTER — Encounter: Payer: Self-pay | Admitting: Neurology

## 2015-06-02 ENCOUNTER — Ambulatory Visit (INDEPENDENT_AMBULATORY_CARE_PROVIDER_SITE_OTHER): Payer: 59 | Admitting: Neurology

## 2015-06-02 VITALS — BP 126/80 | HR 86 | Resp 16 | Ht 66.0 in | Wt 177.4 lb

## 2015-06-02 DIAGNOSIS — G35 Multiple sclerosis: Secondary | ICD-10-CM

## 2015-06-02 DIAGNOSIS — R4189 Other symptoms and signs involving cognitive functions and awareness: Secondary | ICD-10-CM

## 2015-06-02 DIAGNOSIS — R5383 Other fatigue: Secondary | ICD-10-CM | POA: Diagnosis not present

## 2015-06-02 DIAGNOSIS — R270 Ataxia, unspecified: Secondary | ICD-10-CM | POA: Insufficient documentation

## 2015-06-02 DIAGNOSIS — R269 Unspecified abnormalities of gait and mobility: Secondary | ICD-10-CM

## 2015-06-02 DIAGNOSIS — G4719 Other hypersomnia: Secondary | ICD-10-CM | POA: Diagnosis not present

## 2015-06-02 DIAGNOSIS — R0683 Snoring: Secondary | ICD-10-CM | POA: Diagnosis not present

## 2015-06-02 DIAGNOSIS — G35D Multiple sclerosis, unspecified: Secondary | ICD-10-CM

## 2015-06-02 DIAGNOSIS — Z79899 Other long term (current) drug therapy: Secondary | ICD-10-CM | POA: Diagnosis not present

## 2015-06-02 MED ORDER — PHENTERMINE HCL 37.5 MG PO CAPS: 37.5000 mg | ORAL_CAPSULE | ORAL | Status: DC

## 2015-06-02 NOTE — Progress Notes (Signed)
[facility]  PATIENT: Stephanie Perkins DOB: 1961/03/19  REFERRING DOCTOR OR PCP:  Nani Gasser SOURCE: patient, records from [facility] and Dr. Linford Arnold  _________________________________   HISTORICAL  CHIEF COMPLAINT:  Chief Complaint  Patient presents with  . Multiple Sclerosis    Stephanie Perkins is here with her husband Caryn Bee for eval of MS.  Sts. she was dx. around 1998.  Presenting sx. were right sided facial numbness and vertigo. Sts. dx. confirmed with MRI and LP.  She initially saw a neurologist here in [place] but can't remember his name.  She opted not to immed. start a MS med.  About 2 yrs. later she transferred care to Dr. Tinnie Gens at [facility] and he started her on Avonex.  Around 18 mos. later he switched her to Rebif, but she doesn't remember why.  Sts. 2-3 yrs. later Dr. Tinnie Gens  . Fatigue    switched her to Copaxone; again she is not clear why.  Dr. Tinnie Gens moved and she was then eval by several other neurologists, including Dr. Renne Crigler at [facility], and Dr. Daphane Shepherd with [facility].  Sts. she is currently on Copaxone 40mg  3 times per week.  She c/o worsening fatigue over the last year. She has worsening numbness in arms and legs.  Intermittent dizziness.  Decreased concentration.  She is currently taking Modafinil 200mg  bid and it helps some, but not enought.  She tried Ritalin in the past, not sure of   . Decreased Concentration    dose; sts. it helped but wore off too quickly./fim  . MRI    Sts. last mri brain was done 05-28-15 at [facility] on [address]. and sts. was told there was nothing new on mri.  Sts. she is currenlty on short term disability due to fatigue.  She works in the [employer]./fim    HISTORY OF PRESENT ILLNESS:  I had the pleasure of seeing your patient, Stephanie Perkins, at [facility] for neurologic consultation regarding her multiple sclerosis.  MS history: In 1998 she presented with right  facial weakness and vertigo. After couple of Dr. visit she was referred to a neurologist and an MRI was performed that MRI was consistent with MS and it was followed up with a lumbar puncture. The CSF was also consistent with MS. Initially, she was placed on Avonex. She was diagnosed in [place] but transferred to Dr. Leotis Shames at [facility] after sure. Time. When he left [facility] she began to see Dr. Renne Crigler and later saw her Dr. Daphane Shepherd she has had a couple of exacerbations. Around 2010, she had an episode of diplopia that lasted about 6 weeks and she received IV steroids. Also around that time she had the onset of numbness in both hands.. Unfortunately, the numbness has mostly persisted. Currently she is on Copaxone 40 mg 3 times a week and has been on that medication for many years. Both of exacerbations occurred while she was on Copaxone she is JCV antibody positive. She had an MRI of the brain last week at [facility] she was told that the MRI did not show any new lesions. We will try to get the actual MRI images.  Gait/strength/sensation: Notes mild difficulties with her gait. Specifically, she will veer one way or the other as she walks, some of the time. She does not note any actual weakness but her arms and legs feel heavy. Her hands are numb and clumsy.   Typing is  more difficult.   She especially has difficulty with small buttons and earrings  Bladder/bowel:Marland Kitchen She denies any significant difficulties with bowel or bladder.  Vision: Her diplopia completely resolved when it occurred in the past. She has never had optic neuritis. She denies any current MS related visual issue.  Fatigue/sleep: She reports fatigue that is both cognitive and physical. She is unsure how much benefit she is getting from Provigil. In the past, she was on Ritalin. She noted that it wore off after a few hours but that she felt her focus was better while she was on it.     She also has some sleep maintenance  insomnia. She will usually fall asleep easily. However, she will often wake up after a few hours and then toss and turn for several hours to she can fall back asleep. She does not feel refreshed in the morning. Her husband notes that she snores and she was sometimes have pauses in her breathing and gasping.  Mood/cognition: She currently is having some depression with decreased mood, apathy and irritability. Many years ago she was on Prozac and is not sure where the not it helped her. A few years ago she went on Prozac again and did not feel good when she took it. She also is having some difficulty with cognition. This has worsened over the last 5 years. She has decreased focus and attention additionally she has noted problems with word finding, executive function and short-term memory.    EPWORTH SLEEPINESS SCALE  On a scale of 0 - 3 what is the chance of dozing:  Sitting and Reading:   2 Watching TV:    3 Sitting inactive in a public place: 0 Passenger in car for one hour: 3 Lying down to rest in the afternoon: 2 Sitting and talking to someone: 0 Sitting quietly after lunch:  1 In a car, stopped in traffic:  0  Total (out of 24):    11/24   (Mild EDS)     REVIEW OF SYSTEMS: Constitutional: No fevers, chills, sweats, or change in appetite.  fatigue Eyes: No visual changes, double vision, eye pain Ear, nose and throat: No hearing loss, ear pain, nasal congestion, sore throat Cardiovascular: No chest pain, palpitations Respiratory: No shortness of breath at rest or with exertion.   No wheezes.   Witnessed OSA at night GastrointestinaI: No nausea, vomiting, diarrhea, abdominal pain, fecal incontinence Genitourinary: No dysuria, urinary retention or frequency.  No nocturia. Musculoskeletal: No neck pain, back pain Integumentary: No rash, pruritus, skin lesions Neurological: as above Psychiatric: as above Endocrine: No palpitations, diaphoresis, change in appetite, change in weigh  or increased thirst Hematologic/Lymphatic: No anemia, purpura, petechiae. Allergic/Immunologic: No itchy/runny eyes, nasal congestion, recent allergic reactions, rashes  ALLERGIES: Allergies  Allergen Reactions  . Crestor [Rosuvastatin] Other (See Comments)    FAtigue and myalgia  . Penicillins     REACTION: hives    HOME MEDICATIONS:  Current outpatient prescriptions:  .  atorvastatin (LIPITOR) 40 MG tablet, Take 1 tablet (40 mg total) by mouth at bedtime., Disp: 90 tablet, Rfl: 3 .  COPAXONE 40 MG/ML SOSY, , Disp: , Rfl:  .  cyanocobalamin (,VITAMIN B-12,) 1000 MCG/ML injection, INJECT EVERY MONTH., Disp: , Rfl: 11 .  ipratropium (ATROVENT) 0.03 % nasal spray, Place 2 sprays into both nostrils 3 (three) times daily as needed for rhinitis., Disp: 30 mL, Rfl: 0 .  lisinopril-hydrochlorothiazide (PRINZIDE,ZESTORETIC) 20-12.5 MG tablet, Take 1 tablet by mouth daily., Disp: 90  tablet, Rfl: 1 .  lisinopril-hydrochlorothiazide (PRINZIDE,ZESTORETIC) 20-12.5 MG tablet, TAKE 1 TABLET BY MOUTH DAILY. NEED FOLLOW UP APPOINTMENT FOR MORE REFILLS, Disp: 15 tablet, Rfl: 0 .  modafinil (PROVIGIL) 200 MG tablet, TAKE 1-2 TABLETS BY MOUTH AS NEEDED FOR FAUTIGUE, Disp: , Rfl: 3  PAST MEDICAL HISTORY: Past Medical History  Diagnosis Date  . Depression   . Hypertension   . Hyperlipidemia   . Multiple sclerosis (HCC)   . Vision abnormalities     PAST SURGICAL HISTORY: Past Surgical History  Procedure Laterality Date  . Appendectomy  1-99  . Cesarean section  9-85  . Tubal ligation  2002  . Abdominal hysterectomy  12-09    for fibroids    FAMILY HISTORY: Family History  Problem Relation Age of Onset  . Hyperlipidemia Mother   . Hypertension Mother   . Alcohol abuse Maternal Grandfather   . Cancer Maternal Grandfather     SOCIAL HISTORY:  Social History   Social History  . Marital Status: Married    Spouse Name: N/A  . Number of Children: N/A  . Years of Education: N/A    Occupational History  . Not on file.   Social History Main Topics  . Smoking status: Former Smoker -- 0.50 packs/day    Types: Cigarettes  . Smokeless tobacco: Not on file  . Alcohol Use: No  . Drug Use: No  . Sexual Activity: Not on file   Other Topics Concern  . Not on file   Social History Narrative     PHYSICAL EXAM  Filed Vitals:   06/02/15 1013  BP: 126/80  Pulse: 86  Resp: 16  Height: 5\' 6"  (1.676 m)  Weight: 177 lb 6.4 oz (80.468 kg)    Body mass index is 28.65 kg/(m^2).   General: The patient is well-developed and well-nourished and in no acute distress  Eyes:  Funduscopic exam shows normal optic discs and retinal vessels.  Neck: The neck is supple, no carotid bruits are noted.  The neck is nontender.  Cardiovascular: The heart has a regular rate and rhythm with a normal S1 and S2. There were no murmurs, gallops or rubs. Lungs are clear to auscultation.  Skin: Extremities are without significant edema.  Musculoskeletal:  Back is nontender  Neurologic Exam  Mental status: The patient is alert and oriented x 3 at the time of the examination. The patient has apparent normal recent (3/3) and remote memory, with an apparently normal attention span and concentration ability ([id number]).   Speech is near normal.   She has difficulty coming up with words at time.  Cranial nerves: Extraocular movements are full. Pupils are equal, round, and reactive to light and accomodation.  Visual fields are full.  Facial symmetry is present. There is good facial sensation to soft touch bilaterally.Facial strength is normal.  Trapezius and sternocleidomastoid strength is normal. No dysarthria is noted.  The tongue is midline, and the patient has symmetric elevation of the soft palate. No obvious hearing deficits are noted.  Motor:  Muscle bulk is normal.   Tone is normal. Strength is  5 / 5 in all 4 extremities.   Sensory: Sensory testing is intact to pinprick, soft  touch and vibration sensation in all 4 extremities.  Coordination: Cerebellar testing reveals good finger-nose-finger and heel-to-shin bilaterally.  Gait and station: Station is normal.   Gait is normal. Tandem gait is normal. Romberg is negative.   Reflexes: Deep tendon reflexes are symmetric and normal bilaterally.  Plantar responses are flexor.    DIAGNOSTIC DATA (LABS, IMAGING, TESTING) - I reviewed patient records, labs, notes, testing and imaging myself where available.  Lab Results  Component Value Date   WBC 6.7 02/11/2013   HGB 14.4 02/11/2013   HCT 42.6 02/11/2013   MCV 79.2 02/11/2013   PLT 299 02/11/2013      Component Value Date/Time   NA 135 05/20/2015 1530   K 4.2 05/20/2015 1530   CL 101 05/20/2015 1530   CO2 28 05/20/2015 1530   GLUCOSE 82 05/20/2015 1530   BUN 15 05/20/2015 1530   CREATININE 0.59 05/20/2015 1530   CREATININE 0.50 11/15/2012 1804   CALCIUM 9.1 05/20/2015 1530   PROT 7.0 10/21/2014 0825   ALBUMIN 4.4 10/21/2014 0825   AST 21 10/21/2014 0825   ALT 24 10/21/2014 0825   ALKPHOS 61 10/21/2014 0825   BILITOT 0.7 10/21/2014 0825   GFRNONAA >89 10/21/2014 0825   GFRNONAA >90 11/15/2012 1804   GFRAA >89 10/21/2014 0825   GFRAA >90 11/15/2012 1804   Lab Results  Component Value Date   CHOL 203* 10/21/2014   HDL 63 10/21/2014   LDLCALC 126* 10/21/2014   TRIG 69 10/21/2014   CHOLHDL 3.2 10/21/2014   No results found for: HGBA1C Lab Results  Component Value Date   VITAMINB12 397 10/21/2014   Lab Results  Component Value Date   TSH 1.695 05/18/2009       ASSESSMENT AND PLAN  MULTIPLE SCLEROSIS - Plan: CBC with Differential/Platelet, Stratify JCV Antibody Test (Quest), Hepatic function panel, Quantiferon tb gold assay (blood)  Other fatigue - Plan: CBC with Differential/Platelet, Stratify JCV Antibody Test (Quest), Hepatic function panel, Quantiferon tb gold assay (blood)  High risk medication use - Plan: CBC with  Differential/Platelet, Stratify JCV Antibody Test (Quest), Hepatic function panel, Quantiferon tb gold assay (blood)  Excessive daytime sleepiness - Plan: Split night study  Snoring - Plan: Split night study   In summary, Stephanie Perkins is a 55 year old woman with multiple sclerosis who has many symptoms including gait disturbance, cognitive disturbance, fatigue and depression. Currently she is on Copaxone. She tolerates it well. However, over the past few years she has had a couple exacerbations as well as some changes on MRI. Therefore, she might do better on a different medication. She was most interested in switching to Aubagio and I will check blood work for that. I will also get the actual MRI images from [facility]. It seems like there are more aggressive changes, we may wish to consider a different agent.  Some of her symptoms such as her sleepiness, fatigue and depression might be interrelated. Since she has also had witnessed OSA at night, we need to check a split-night study to determine if she has sleep apnea severe enough to treat., We will set up CPAP for her. In the interim, I'm also going to place her on phentermine as it is a mild stimulant and could also help weight loss.  If mood does not improve, we will consider Lexapro or other agent.  She will return to see me in 3 months or sooner if there are new or worsening neurologic symptoms.  Richard A. Epimenio Foot, MD, PhD 06/02/2015, 10:27 AM Certified in Neurology, Clinical Neurophysiology, Sleep Medicine, Pain Medicine and Neuroimaging  [facility] [address], [address] [place], [place] [address] [phone]

## 2015-06-03 LAB — CBC WITH DIFFERENTIAL/PLATELET
BASOS ABS: 0.1 10*3/uL (ref 0.0–0.2)
Basos: 1 %
EOS (ABSOLUTE): 0.2 10*3/uL (ref 0.0–0.4)
Eos: 2 %
HEMOGLOBIN: 13.4 g/dL (ref 11.1–15.9)
Hematocrit: 40.9 % (ref 34.0–46.6)
IMMATURE GRANS (ABS): 0 10*3/uL (ref 0.0–0.1)
IMMATURE GRANULOCYTES: 0 %
LYMPHS: 33 %
Lymphocytes Absolute: 2.9 10*3/uL (ref 0.7–3.1)
MCH: 27.3 pg (ref 26.6–33.0)
MCHC: 32.8 g/dL (ref 31.5–35.7)
MCV: 83 fL (ref 79–97)
MONOCYTES: 8 %
Monocytes Absolute: 0.7 10*3/uL (ref 0.1–0.9)
Neutrophils Absolute: 4.9 10*3/uL (ref 1.4–7.0)
Neutrophils: 56 %
Platelets: 352 10*3/uL (ref 150–379)
RBC: 4.91 x10E6/uL (ref 3.77–5.28)
RDW: 14.1 % (ref 12.3–15.4)
WBC: 8.8 10*3/uL (ref 3.4–10.8)

## 2015-06-03 LAB — HEPATIC FUNCTION PANEL
ALBUMIN: 4.7 g/dL (ref 3.5–5.5)
ALK PHOS: 78 IU/L (ref 39–117)
ALT: 19 IU/L (ref 0–32)
AST: 16 IU/L (ref 0–40)
BILIRUBIN TOTAL: 0.4 mg/dL (ref 0.0–1.2)
BILIRUBIN, DIRECT: 0.13 mg/dL (ref 0.00–0.40)
TOTAL PROTEIN: 7.7 g/dL (ref 6.0–8.5)

## 2015-06-05 LAB — QUANTIFERON IN TUBE
QFT TB AG MINUS NIL VALUE: 0.03 [IU]/mL
QUANTIFERON MITOGEN VALUE: >10 IU/mL
QUANTIFERON TB AG VALUE: 0.08 IU/mL
QUANTIFERON TB GOLD: NEGATIVE
Quantiferon Nil Value: 0.05 IU/mL

## 2015-06-05 LAB — QUANTIFERON TB GOLD ASSAY (BLOOD)

## 2015-06-07 ENCOUNTER — Encounter: Payer: Self-pay | Admitting: *Deleted

## 2015-06-08 ENCOUNTER — Other Ambulatory Visit: Payer: Self-pay | Admitting: *Deleted

## 2015-06-08 ENCOUNTER — Telehealth: Payer: Self-pay | Admitting: *Deleted

## 2015-06-08 DIAGNOSIS — R5382 Chronic fatigue, unspecified: Secondary | ICD-10-CM

## 2015-06-08 DIAGNOSIS — R0683 Snoring: Secondary | ICD-10-CM

## 2015-06-08 DIAGNOSIS — R0681 Apnea, not elsewhere classified: Secondary | ICD-10-CM

## 2015-06-08 DIAGNOSIS — G4719 Other hypersomnia: Secondary | ICD-10-CM

## 2015-06-08 NOTE — Telephone Encounter (Signed)
I have spoken with Stephanie Perkins this morning and per RAS, advised Stephanie Perkins labs look good--I will turn start form in today.  She verbalized understanding of same, but sts. she would like to wait on starting Stephanie Perkins.  She has an appt. with Dr. Daphane Shepherd on 3-13--is not sure if she wants to continue following up with him or with RAS--sts. will call me back if she wants to start Stephanie Perkins.  I will let Dr. Epimenio Foot know; will not turn Stephanie Perkins start form in unless I hear back from Stephanie Perkins./fim

## 2015-06-08 NOTE — Telephone Encounter (Signed)
-----   Message from Asa Lente, MD sent at 06/07/2015  6:33 PM EST ----- Lab work looks good. We can turn in the [facility] for Aubagio.

## 2015-07-26 ENCOUNTER — Telehealth: Payer: Self-pay | Admitting: Neurology

## 2015-07-26 NOTE — Telephone Encounter (Signed)
MRI of the brain from 07/12/2012, 07/05/2014 and 05/28/2015 were reviewed. 2014 MRI showed an enhancing lesion in the left frontal lobe. No definite new lesions are noted on the 2016 or 2017 MRI.  Besides multiple periventricular and some juxtacortical T2 weighted lesions, there is also a focus noted in the left middle cerebellar peduncle.  By report, MRI of the cervical/thoracic spine showed several chronic lesions.

## 2015-09-02 ENCOUNTER — Ambulatory Visit: Payer: 59 | Admitting: Neurology

## 2015-09-20 ENCOUNTER — Other Ambulatory Visit: Payer: Self-pay | Admitting: Family Medicine

## 2015-10-19 ENCOUNTER — Telehealth: Payer: Self-pay | Admitting: Family Medicine

## 2015-10-19 NOTE — Telephone Encounter (Signed)
Called pt to schedule her fu for bp. Pt scheduled an appt on Sept. 20th said she has enough meds till then. Thanks

## 2015-12-22 ENCOUNTER — Encounter: Payer: Self-pay | Admitting: Family Medicine

## 2015-12-22 ENCOUNTER — Ambulatory Visit (INDEPENDENT_AMBULATORY_CARE_PROVIDER_SITE_OTHER): Payer: 59 | Admitting: Family Medicine

## 2015-12-22 VITALS — BP 130/86 | HR 86 | Ht 66.0 in | Wt 205.0 lb

## 2015-12-22 DIAGNOSIS — R7301 Impaired fasting glucose: Secondary | ICD-10-CM

## 2015-12-22 DIAGNOSIS — E538 Deficiency of other specified B group vitamins: Secondary | ICD-10-CM

## 2015-12-22 DIAGNOSIS — G35 Multiple sclerosis: Secondary | ICD-10-CM

## 2015-12-22 DIAGNOSIS — E785 Hyperlipidemia, unspecified: Secondary | ICD-10-CM | POA: Diagnosis not present

## 2015-12-22 DIAGNOSIS — I10 Essential (primary) hypertension: Secondary | ICD-10-CM

## 2015-12-22 LAB — VITAMIN B12: Vitamin B-12: 396 pg/mL (ref 200–1100)

## 2015-12-22 NOTE — Progress Notes (Signed)
Subjective:    CC: HTN  HPI: Hypertension- Pt denies chest pain, SOB, dizziness, or heart palpitations.  Taking meds as directed w/o problems.  Denies medication side effects.    IFG - No inc thirst or urination.    Hyperlipidemia - Currently on atorvastatin 40 mg nightly doing well with the medication without any significant side effects.  MS-following with neurology. Unfortunately the new medication that she is on is not working well she does have a follow-up later this month they will likely be changing medications again. She said at this point she's having enough dizziness that she's no longer driving.  Past medical history, Surgical history, Family history not pertinant except as noted below, Social history, Allergies, and medications have been entered into the medical record, reviewed, and corrections made.   Review of Systems: No fevers, chills, night sweats, weight loss, chest pain, or shortness of breath.   Objective:    General: Well Developed, well nourished, and in no acute distress.  Neuro: Alert and oriented x3, extra-ocular muscles intact, sensation grossly intact.  HEENT: Normocephalic, atraumaticno TM, no cerv LNs.  Skin: Warm and dry, no rashes. Cardiac: Regular rate and rhythm, no murmurs rubs or gallops, no lower extremity edema.  Respiratory: Clear to auscultation bilaterally. Not using accessory muscles, speaking in full sentences.   Impression and Recommendations:   HTN - Well controlled. Continue current regimen. Follow up in  6 mo. Due for labs today.   IFG - Will check A1C today.   Hyperlipidemia - due to recheck lipids and liver.   MS-working with neurology. They will likely be changing her medication again seen.

## 2015-12-23 ENCOUNTER — Other Ambulatory Visit: Payer: Self-pay

## 2015-12-23 DIAGNOSIS — I10 Essential (primary) hypertension: Secondary | ICD-10-CM

## 2015-12-23 LAB — COMPLETE METABOLIC PANEL WITH GFR
ALT: 25 U/L (ref 6–29)
AST: 21 U/L (ref 10–35)
Albumin: 4.6 g/dL (ref 3.6–5.1)
Alkaline Phosphatase: 56 U/L (ref 33–130)
BUN: 14 mg/dL (ref 7–25)
CALCIUM: 9.7 mg/dL (ref 8.6–10.4)
CHLORIDE: 102 mmol/L (ref 98–110)
CO2: 23 mmol/L (ref 20–31)
CREATININE: 0.6 mg/dL (ref 0.50–1.05)
Glucose, Bld: 102 mg/dL — ABNORMAL HIGH (ref 65–99)
Potassium: 3.8 mmol/L (ref 3.5–5.3)
Sodium: 138 mmol/L (ref 135–146)
Total Bilirubin: 0.7 mg/dL (ref 0.2–1.2)
Total Protein: 7.1 g/dL (ref 6.1–8.1)

## 2015-12-23 LAB — LIPID PANEL
CHOL/HDL RATIO: 3.6 ratio (ref <=5.0)
CHOLESTEROL: 245 mg/dL — AB (ref 125–200)
HDL: 68 mg/dL (ref >=46)
LDL Cholesterol: 139 mg/dL — ABNORMAL HIGH (ref <130)
TRIGLYCERIDES: 191 mg/dL — AB (ref <150)
VLDL: 38 mg/dL — AB (ref <30)

## 2015-12-23 LAB — HEMOGLOBIN A1C
Hgb A1c MFr Bld: 5.7 % — ABNORMAL HIGH (ref <5.7)
MEAN PLASMA GLUCOSE: 117 mg/dL

## 2015-12-23 MED ORDER — CYANOCOBALAMIN 1000 MCG/ML IJ SOLN: 1000.0000 ug | INTRAMUSCULAR | 3 refills | Status: DC

## 2015-12-23 MED ORDER — LISINOPRIL-HYDROCHLOROTHIAZIDE 20-12.5 MG PO TABS: 1.0000 | ORAL_TABLET | Freq: Every day | ORAL | 1 refills | Status: DC

## 2016-01-01 ENCOUNTER — Other Ambulatory Visit: Payer: Self-pay | Admitting: Family Medicine

## 2016-01-01 DIAGNOSIS — I10 Essential (primary) hypertension: Secondary | ICD-10-CM

## 2016-01-26 LAB — HM MAMMOGRAPHY

## 2016-04-25 ENCOUNTER — Telehealth: Payer: Self-pay | Admitting: *Deleted

## 2016-04-25 NOTE — Telephone Encounter (Signed)
Wellness forms completed, faxed, confirmation received, placed in scan. Laureen Ochs, Viann Shove

## 2016-06-26 ENCOUNTER — Other Ambulatory Visit: Payer: Self-pay | Admitting: Family Medicine

## 2016-06-26 ENCOUNTER — Encounter: Payer: Self-pay | Admitting: Family Medicine

## 2016-06-26 DIAGNOSIS — I10 Essential (primary) hypertension: Secondary | ICD-10-CM

## 2016-06-27 MED ORDER — LISINOPRIL-HYDROCHLOROTHIAZIDE 20-12.5 MG PO TABS: 1.0000 | ORAL_TABLET | Freq: Every day | ORAL | 1 refills | Status: DC

## 2016-09-07 ENCOUNTER — Telehealth: Payer: Self-pay | Admitting: Family Medicine

## 2016-09-07 NOTE — Telephone Encounter (Signed)
Call pt: I received a note form your pharmacy that you screened positive for some depressive symptoms. I would be happy to discuss this with you if you would like. She also needs an appt soon for BP and glucose check.

## 2016-09-12 NOTE — Telephone Encounter (Signed)
Pt notified and will call back to schedule a time .Loralee Pacas [place]

## 2016-10-27 ENCOUNTER — Encounter: Payer: Self-pay | Admitting: Family Medicine

## 2016-10-27 ENCOUNTER — Ambulatory Visit (INDEPENDENT_AMBULATORY_CARE_PROVIDER_SITE_OTHER): Payer: 59 | Admitting: Family Medicine

## 2016-10-27 VITALS — BP 136/82 | HR 102 | Wt 213.0 lb

## 2016-10-27 DIAGNOSIS — R635 Abnormal weight gain: Secondary | ICD-10-CM | POA: Diagnosis not present

## 2016-10-27 DIAGNOSIS — I10 Essential (primary) hypertension: Secondary | ICD-10-CM | POA: Diagnosis not present

## 2016-10-27 DIAGNOSIS — R7301 Impaired fasting glucose: Secondary | ICD-10-CM | POA: Diagnosis not present

## 2016-10-27 DIAGNOSIS — G35 Multiple sclerosis: Secondary | ICD-10-CM | POA: Diagnosis not present

## 2016-10-27 DIAGNOSIS — Z6833 Body mass index (BMI) 33.0-33.9, adult: Secondary | ICD-10-CM

## 2016-10-27 DIAGNOSIS — Z1389 Encounter for screening for other disorder: Secondary | ICD-10-CM

## 2016-10-27 DIAGNOSIS — Z1331 Encounter for screening for depression: Secondary | ICD-10-CM

## 2016-10-27 DIAGNOSIS — R5383 Other fatigue: Secondary | ICD-10-CM

## 2016-10-27 LAB — POCT GLYCOSYLATED HEMOGLOBIN (HGB A1C): Hemoglobin A1C: 6.1

## 2016-10-27 NOTE — Progress Notes (Signed)
Subjective:    CC: HTN, IFG  HPI:  Hypertension- Pt denies chest pain, SOB, dizziness, or heart palpitations.  Taking meds as directed w/o problems.  Denies medication side effects.    Impaired fasting glucose-no increased thirst or urination. No symptoms consistent with hypoglycemia.  MS - She saw Dr. Marlena Clipper at [facility] in March and was switched back to Copaxone. She has been extremely fatigued to the point where she is rarely leaving her house. She hasn't cleaned her home.  She is unable to exercise.    Past medical history, Surgical history, Family history not pertinant except as noted below, Social history, Allergies, and medications have been entered into the medical record, reviewed, and corrections made.   Review of Systems: No fevers, chills, night sweats, weight loss, chest pain, or shortness of breath.   Objective:    General: Well Developed, well nourished, and in no acute distress.  Neuro: Alert and oriented x3, extra-ocular muscles intact, sensation grossly intact.  HEENT: Normocephalic, atraumatic  Skin: Warm and dry, no rashes. Cardiac: Regular rate and rhythm, no murmurs rubs or gallops, no lower extremity edema.  Respiratory: Clear to auscultation bilaterally. Not using accessory muscles, speaking in full sentences.   Impression and Recommendations:    HTN - Well controlled. Due for CMP and lipid panel. Continue current regimen. Follow up in  6 months.   IFG - Well controlled. Continue current regimen. Follow up in  78months.    MS - Not doing well. She is extremely fatigued. We discussed at least in some additional blood work. She is on B12 injections. She's due to recheck her levels anyway also check her thyroid.  Consider evaluating for adrenal insufficiency.    B12 deficiency-due to recheck B12 levels. She also needs refills.  Positive depression screen-she feels like this is directly related to her MS not doing well and does not think it's true  depression. Will monitor carefully.  BMI 33 - has gained about 35 lbs in the last 15 months.  Check TSH

## 2016-11-01 LAB — VITAMIN B12: Vitamin B-12: 639 pg/mL (ref 200–1100)

## 2016-11-01 LAB — COMPLETE METABOLIC PANEL WITH GFR
ALBUMIN: 4.4 g/dL (ref 3.6–5.1)
ALK PHOS: 53 U/L (ref 33–130)
ALT: 42 U/L — ABNORMAL HIGH (ref 6–29)
AST: 25 U/L (ref 10–35)
BUN: 11 mg/dL (ref 7–25)
CALCIUM: 9.5 mg/dL (ref 8.6–10.4)
CHLORIDE: 101 mmol/L (ref 98–110)
CO2: 24 mmol/L (ref 20–31)
Creat: 0.68 mg/dL (ref 0.50–1.05)
GFR, Est Non African American: >89 mL/min (ref >=60)
Glucose, Bld: 108 mg/dL — ABNORMAL HIGH (ref 65–99)
POTASSIUM: 4.3 mmol/L (ref 3.5–5.3)
Sodium: 139 mmol/L (ref 135–146)
Total Bilirubin: 0.6 mg/dL (ref 0.2–1.2)
Total Protein: 6.8 g/dL (ref 6.1–8.1)

## 2016-11-01 LAB — LIPID PANEL W/REFLEX DIRECT LDL
CHOL/HDL RATIO: 4.8 ratio (ref <5.0)
CHOLESTEROL: 251 mg/dL — AB (ref <200)
HDL: 52 mg/dL (ref >50)
LDL-Cholesterol: 165 mg/dL — ABNORMAL HIGH
NON-HDL CHOLESTEROL (CALC): 199 mg/dL — AB (ref <130)
TRIGLYCERIDES: 188 mg/dL — AB (ref <150)

## 2016-11-01 LAB — TSH: TSH: 2.57 m[IU]/L

## 2016-11-01 LAB — MAGNESIUM: MAGNESIUM: 1.8 mg/dL (ref 1.5–2.5)

## 2016-11-03 LAB — VITAMIN B6: Vitamin B6: 10.1 ng/mL (ref 2.1–21.7)

## 2016-11-04 LAB — VITAMIN B1: VITAMIN B1 (THIAMINE): 10 nmol/L (ref 8–30)

## 2016-11-09 ENCOUNTER — Other Ambulatory Visit: Payer: Self-pay | Admitting: Family Medicine

## 2016-11-09 DIAGNOSIS — I10 Essential (primary) hypertension: Secondary | ICD-10-CM

## 2016-11-13 ENCOUNTER — Other Ambulatory Visit: Payer: Self-pay | Admitting: Family Medicine

## 2016-11-16 NOTE — Addendum Note (Signed)
Addended by: Deno Etienne on: 11/16/2016 12:32 PM   Modules accepted: Orders

## 2016-12-06 ENCOUNTER — Encounter: Payer: Self-pay | Admitting: Family Medicine

## 2016-12-10 ENCOUNTER — Other Ambulatory Visit: Payer: Self-pay | Admitting: Family Medicine

## 2016-12-13 MED ORDER — CYANOCOBALAMIN 1000 MCG/ML IJ SOLN: 1000.0000 ug | INTRAMUSCULAR | 3 refills | Status: DC

## 2016-12-13 NOTE — Addendum Note (Signed)
Addended by: Chalmers Cater on: 12/13/2016 07:10 AM   Modules accepted: Orders

## 2017-01-25 ENCOUNTER — Other Ambulatory Visit: Payer: Self-pay | Admitting: Family Medicine

## 2017-01-25 DIAGNOSIS — I10 Essential (primary) hypertension: Secondary | ICD-10-CM

## 2017-03-28 ENCOUNTER — Encounter: Payer: Self-pay | Admitting: Family Medicine

## 2017-04-13 ENCOUNTER — Encounter: Payer: Self-pay | Admitting: Family Medicine

## 2017-04-30 ENCOUNTER — Ambulatory Visit (INDEPENDENT_AMBULATORY_CARE_PROVIDER_SITE_OTHER): Payer: 59 | Admitting: Licensed Clinical Social Worker

## 2017-04-30 DIAGNOSIS — F331 Major depressive disorder, recurrent, moderate: Secondary | ICD-10-CM

## 2017-05-10 ENCOUNTER — Encounter: Payer: Self-pay | Admitting: Family Medicine

## 2017-05-10 ENCOUNTER — Ambulatory Visit: Payer: 59 | Admitting: Family Medicine

## 2017-05-10 ENCOUNTER — Telehealth: Payer: Self-pay | Admitting: Family Medicine

## 2017-05-10 VITALS — BP 146/78 | HR 95 | Ht 66.0 in | Wt 216.0 lb

## 2017-05-10 DIAGNOSIS — R7301 Impaired fasting glucose: Secondary | ICD-10-CM

## 2017-05-10 DIAGNOSIS — F418 Other specified anxiety disorders: Secondary | ICD-10-CM

## 2017-05-10 DIAGNOSIS — I10 Essential (primary) hypertension: Secondary | ICD-10-CM

## 2017-05-10 DIAGNOSIS — R748 Abnormal levels of other serum enzymes: Secondary | ICD-10-CM | POA: Diagnosis not present

## 2017-05-10 LAB — POCT GLYCOSYLATED HEMOGLOBIN (HGB A1C): HEMOGLOBIN A1C: 6.3

## 2017-05-10 MED ORDER — LISINOPRIL-HYDROCHLOROTHIAZIDE 20-12.5 MG PO TABS: 1.0000 | ORAL_TABLET | Freq: Every day | ORAL | 1 refills | Status: DC

## 2017-05-10 MED ORDER — ESCITALOPRAM OXALATE 10 MG PO TABS: ORAL_TABLET | ORAL | 0 refills | Status: DC

## 2017-05-10 NOTE — Progress Notes (Signed)
Subjective:    Patient ID: Stephanie Perkins, female    DOB: July 14, 1960, 57 y.o.   MRN: [id number]  HPI  Hypertension- Pt denies chest pain, SOB, dizziness, or heart palpitations.  Taking meds as directed w/o problems.  Denies medication side effects.   Impaired fasting glucose-no increased thirst or urination. No symptoms consistent with hypoglycemia.  Elevated liver enyzmes -due to recheck liver enzymes.  We called her back in July and just reminded her to avoid any excess Tylenol or alcohol products. Lab Results  Component Value Date   ALT 42 (H) 10/31/2016   AST 25 10/31/2016   ALKPHOS 53 10/31/2016   BILITOT 0.6 10/31/2016    She also reports that she is been seeing a therapist regularly and her therapist suggested that she talk to her doctor about maybe starting an antidepressant.  She reports little interest and pleasure doing things and feeling down nearly every day.  She has had difficulty with low energy, trouble concentrating, and feeling bad about herself.  She has had thoughts of being better off dead several days a week.  She also reports feeling nervous and on edge nearly every day.  Her MS has been flaring over the last year and she is had to actually quit her job.  She is having difficulty just doing daily activities.  Her mother moved in with her and her husband about a year ago as well and this is been overwhelming and very stressful.  And right before Christmas her husband said that he felt like she was not a marital partner.  They have been working on their marriage since then.  Review of Systems  BP (!) 146/78   Pulse 95   Ht 5\' 6"  (1.676 m)   Wt 216 lb (98 kg)   SpO2 98%   BMI 34.86 kg/m     Allergies  Allergen Reactions  . Crestor [Rosuvastatin] Other (See Comments)    FAtigue and myalgia  . Ocrevus [Ocrelizumab] Diarrhea    Severed diarrhea  . Penicillins     REACTION: hives    Past Medical History:  Diagnosis Date  . Depression   . Hyperlipidemia    . Hypertension   . Multiple sclerosis (HCC)   . Vision abnormalities     Past Surgical History:  Procedure Laterality Date  . ABDOMINAL HYSTERECTOMY  12-09   for fibroids  . APPENDECTOMY  1-99  . CESAREAN SECTION  9-85  . TUBAL LIGATION  2002    Social History   Socioeconomic History  . Marital status: Married    Spouse name: Not on file  . Number of children: Not on file  . Years of education: Not on file  . Highest education level: Not on file  Social Needs  . Financial resource strain: Not on file  . Food insecurity - worry: Not on file  . Food insecurity - inability: Not on file  . Transportation needs - medical: Not on file  . Transportation needs - non-medical: Not on file  Occupational History  . Not on file  Tobacco Use  . Smoking status: Former Smoker    Packs/day: 0.50    Types: Cigarettes  . Smokeless tobacco: Never Used  Substance and Sexual Activity  . Alcohol use: No    Alcohol/week: 0.0 oz  . Drug use: No  . Sexual activity: Not on file  Other Topics Concern  . Not on file  Social History Narrative  . Not on file  Family History  Problem Relation Age of Onset  . Hyperlipidemia Mother   . Hypertension Mother   . Alcohol abuse Maternal Grandfather   . Cancer Maternal Grandfather     Outpatient Encounter Medications as of 05/10/2017  Medication Sig  . amphetamine-dextroamphetamine (ADDERALL XR) 10 MG 24 hr capsule Take 10 mg by mouth daily.  Marland Kitchen atorvastatin (LIPITOR) 40 MG tablet TAKE 1 TABLET (40 MG TOTAL) BY MOUTH AT BEDTIME.  Marland Kitchen COPAXONE 40 MG/ML SOSY   . cyanocobalamin (,VITAMIN B-12,) 1000 MCG/ML injection Inject 1 mL (1,000 mcg total) into the muscle every 21 ( twenty-one) days.  Marland Kitchen lisinopril-hydrochlorothiazide (PRINZIDE,ZESTORETIC) 20-12.5 MG tablet Take 1 tablet by mouth daily.  . modafinil (PROVIGIL) 200 MG tablet TAKE 1-2 TABLETS BY MOUTH AS NEEDED FOR FAUTIGUE  . [DISCONTINUED] lisinopril-hydrochlorothiazide (PRINZIDE,ZESTORETIC)  20-12.5 MG tablet TAKE 1 TABLET DAILY  . escitalopram (LEXAPRO) 10 MG tablet 1/2 tab po QD x 6 days then increase to whole tab QD.  . [DISCONTINUED] lisinopril-hydrochlorothiazide (PRINZIDE,ZESTORETIC) 20-12.5 MG tablet TAKE 1 TABLET DAILY. NEED  FOLLOW UP VISIT   No facility-administered encounter medications on file as of 05/10/2017.           Objective:   Physical Exam  Constitutional: She is oriented to person, place, and time. She appears well-developed and well-nourished.  HENT:  Head: Normocephalic and atraumatic.  Cardiovascular: Normal rate, regular rhythm and normal heart sounds.  Pulmonary/Chest: Effort normal and breath sounds normal.  Neurological: She is alert and oriented to person, place, and time.  Skin: Skin is warm and dry.  Psychiatric: She has a normal mood and affect. Her behavior is normal.        Assessment & Plan:  HTN -blood pressure elevated today.  We will have her come back in a couple of weeks for nurse visit to recheck blood pressure.  IFG - STable.  A1C of just a little from previous.  At 6.3 today which is up from 6.1.  Just make sure watching diet.  Repeat again in 6 months.  Elevated liver enyzmes - recheck enzymes levels.    Depression with anxiety-PHQ 9 score of 20 and gad 7 score of 11.  She rates her symptoms is very difficult.  We discussed options.  She took Prozac years ago and says it actually made her feel like she was foggy brain.  She would like to try something different.  Discussed options.  She has no particular preference today.  Will start Lexapro 10 mg.  Follow-up in 1 month to see how well she is doing.  Call if any problems or side effects.

## 2017-05-10 NOTE — Telephone Encounter (Signed)
error 

## 2017-05-11 ENCOUNTER — Telehealth: Payer: Self-pay | Admitting: *Deleted

## 2017-05-11 LAB — COMPLETE METABOLIC PANEL WITH GFR
AG Ratio: 2 (calc) (ref 1.0–2.5)
ALBUMIN MSPROF: 4.5 g/dL (ref 3.6–5.1)
ALT: 28 U/L (ref 6–29)
AST: 18 U/L (ref 10–35)
Alkaline phosphatase (APISO): 50 U/L (ref 33–130)
BUN: 15 mg/dL (ref 7–25)
CALCIUM: 9.5 mg/dL (ref 8.6–10.4)
CO2: 25 mmol/L (ref 20–32)
CREATININE: 0.73 mg/dL (ref 0.50–1.05)
Chloride: 102 mmol/L (ref 98–110)
GFR, EST NON AFRICAN AMERICAN: 91 mL/min/{1.73_m2} (ref >=60)
GFR, Est African American: 106 mL/min/{1.73_m2} (ref >=60)
GLOBULIN: 2.3 g/dL (ref 1.9–3.7)
Glucose, Bld: 100 mg/dL — ABNORMAL HIGH (ref 65–99)
Potassium: 3.9 mmol/L (ref 3.5–5.3)
SODIUM: 139 mmol/L (ref 135–146)
Total Bilirubin: 0.5 mg/dL (ref 0.2–1.2)
Total Protein: 6.8 g/dL (ref 6.1–8.1)

## 2017-05-11 NOTE — Telephone Encounter (Signed)
Forms completed,copied,scanned,faxed,confirmation received.Nivaan Dicenzo [place], CMA 

## 2017-05-11 NOTE — Progress Notes (Signed)
All labs are normal. 

## 2017-05-21 ENCOUNTER — Encounter: Payer: Self-pay | Admitting: Family Medicine

## 2017-05-23 NOTE — Addendum Note (Signed)
Addended by: Deno Etienne on: 05/23/2017 03:27 PM   Modules accepted: Orders

## 2017-06-01 LAB — HM MAMMOGRAPHY

## 2017-06-04 ENCOUNTER — Ambulatory Visit (INDEPENDENT_AMBULATORY_CARE_PROVIDER_SITE_OTHER): Payer: 59 | Admitting: Licensed Clinical Social Worker

## 2017-06-04 DIAGNOSIS — F3341 Major depressive disorder, recurrent, in partial remission: Secondary | ICD-10-CM

## 2017-06-07 ENCOUNTER — Ambulatory Visit: Payer: 59 | Admitting: Family Medicine

## 2017-06-07 ENCOUNTER — Other Ambulatory Visit: Payer: Self-pay | Admitting: Family Medicine

## 2017-06-07 ENCOUNTER — Encounter: Payer: Self-pay | Admitting: Family Medicine

## 2017-06-07 VITALS — BP 134/78 | HR 85 | Ht 66.0 in | Wt 220.0 lb

## 2017-06-07 DIAGNOSIS — I10 Essential (primary) hypertension: Secondary | ICD-10-CM

## 2017-06-07 DIAGNOSIS — F418 Other specified anxiety disorders: Secondary | ICD-10-CM | POA: Diagnosis not present

## 2017-06-07 MED ORDER — ESCITALOPRAM OXALATE 10 MG PO TABS: 10.0000 mg | ORAL_TABLET | Freq: Every day | ORAL | 1 refills | Status: DC

## 2017-06-07 NOTE — Progress Notes (Signed)
   Subjective:    Patient ID: Stephanie Perkins, female    DOB: [birth year], 57 y.o.   MRN: [id number]  HPI   HTN - BP was high at last OV.    She is here today to follow-up for depression and anxiety-when I saw her about 4 weeks ago we discussed starting medication again.  We opted to try Lexapro as she had used Prozac in the past and did not like how she felt on it.  She reports that she is doing well on overall.  She still feels down and depressed several days of the week and has low energy and a little bit of difficulty concentrating.  She denies feeling nervous or anxious or on edge.  And she rates her symptoms is somewhat difficult for depression and not difficult at all for anxiety.  She is still seeing Raynelle Fanning for therapy. Things with her husband have been a little better but he is still not going to therapy.  She now has a 2nd car in the family and that ha sreally helped reduce some stress at home.     Review of Systems     Objective:   Physical Exam  Constitutional: She is oriented to person, place, and time. She appears well-developed and well-nourished.  HENT:  Head: Normocephalic and atraumatic.  Cardiovascular: Normal rate, regular rhythm and normal heart sounds.  Pulmonary/Chest: Effort normal and breath sounds normal.  Neurological: She is alert and oriented to person, place, and time.  Skin: Skin is warm and dry.  Psychiatric: She has a normal mood and affect. Her behavior is normal.          Assessment & Plan:  HTN - much better today.  Will follow .   Depression/anxiety-her last PHQ 9 score was 20 which is down today to 6.  Previous gad 7 score was 11 and it is down to 1.  Significant improvement on current regimen.  Will continue current treatment and follow-up in 3-4 months. Continue current regimen. RF sent.

## 2017-06-19 DIAGNOSIS — F40240 Claustrophobia: Secondary | ICD-10-CM | POA: Insufficient documentation

## 2017-06-21 ENCOUNTER — Ambulatory Visit (INDEPENDENT_AMBULATORY_CARE_PROVIDER_SITE_OTHER): Payer: 59 | Admitting: Licensed Clinical Social Worker

## 2017-06-21 DIAGNOSIS — F3341 Major depressive disorder, recurrent, in partial remission: Secondary | ICD-10-CM | POA: Diagnosis not present

## 2017-07-11 ENCOUNTER — Ambulatory Visit: Payer: 59 | Admitting: Licensed Clinical Social Worker

## 2017-08-01 ENCOUNTER — Ambulatory Visit (INDEPENDENT_AMBULATORY_CARE_PROVIDER_SITE_OTHER): Payer: 59 | Admitting: Licensed Clinical Social Worker

## 2017-08-01 DIAGNOSIS — F3341 Major depressive disorder, recurrent, in partial remission: Secondary | ICD-10-CM

## 2017-08-28 ENCOUNTER — Other Ambulatory Visit: Payer: Self-pay | Admitting: Family Medicine

## 2017-08-30 ENCOUNTER — Ambulatory Visit: Payer: 59 | Admitting: Family Medicine

## 2017-08-30 ENCOUNTER — Encounter: Payer: Self-pay | Admitting: Family Medicine

## 2017-08-30 VITALS — BP 151/83 | HR 84 | Ht 65.98 in | Wt 221.0 lb

## 2017-08-30 DIAGNOSIS — R7301 Impaired fasting glucose: Secondary | ICD-10-CM

## 2017-08-30 DIAGNOSIS — G35 Multiple sclerosis: Secondary | ICD-10-CM | POA: Diagnosis not present

## 2017-08-30 DIAGNOSIS — F418 Other specified anxiety disorders: Secondary | ICD-10-CM

## 2017-08-30 DIAGNOSIS — I10 Essential (primary) hypertension: Secondary | ICD-10-CM

## 2017-08-30 DIAGNOSIS — M25561 Pain in right knee: Secondary | ICD-10-CM

## 2017-08-30 DIAGNOSIS — G35D Multiple sclerosis, unspecified: Secondary | ICD-10-CM

## 2017-08-30 LAB — POCT GLYCOSYLATED HEMOGLOBIN (HGB A1C): HEMOGLOBIN A1C: 6.1 % — AB (ref 4.0–5.6)

## 2017-08-30 NOTE — Progress Notes (Signed)
Subjective:    CC: BP/glucose  HPI:  Hypertension- Pt denies chest pain, SOB, dizziness, or heart palpitations.  Taking meds as directed w/o problems.  Denies medication side effects.    Impaired fasting glucose-no increased thirst or urination. No symptoms consistent with hypoglycemia.  MS - Now on Capaxone.   Recurrrent depression - currrently on Lexapro does report report feeling down several days of the week and some fatigue.  Also some mild symptoms of difficulty concentrating.  No thoughts of wanting to harm herself.  Fortunately her husband of 20+ years recently left her and they are divorcing.  He was previously seeing a counselor, [facility] but she is very difficult to get in with and so for now wants to hold off of any counseling.  Also been expensing some right knee pain for > 1 week.  She is been having to sit on a very low stool to clean the litter box and ever since then has had the right knee pain.  She did not fall on it.  She denies any significant swelling.  It feels like it is just internal into the middle of the knee.  She has been using Advil and that does provide temporary relief.  She describes the pain as sharp.  She is never had any old injuries to the knee.  Past medical history, Surgical history, Family history not pertinant except as noted below, Social history, Allergies, and medications have been entered into the medical record, reviewed, and corrections made.   Review of Systems: No fevers, chills, night sweats, weight loss, chest pain, or shortness of breath.   Objective:    General: Well Developed, well nourished, and in no acute distress.  Neuro: Alert and oriented x3, extra-ocular muscles intact, sensation grossly intact.  HEENT: Normocephalic, atraumatic  Skin: Warm and dry, no rashes. Cardiac: Regular rate and rhythm, no murmurs rubs or gallops, no lower extremity edema.  Respiratory: Clear to auscultation bilaterally. Not using accessory muscles,  speaking in full sentences. MSK: Right knee is nontender along the joint lines.  No significant crepitus with flexion and extension.  Strength at the hip knee and ankle is 5 out of 5.  Negative McMurray's.   Impression and Recommendations:    HTN -blood pressure is uncontrolled today.  She is taking her medication.  She can either follow-up in 1 to 2 weeks with a nurse visit to recheck this or we can have it checked when her other providers office if she has an appointment coming up soon.  IFG -proved.  Hemoglobin A1c down to 6.1 which is great.  Continue to monitor.  Follow-up in 6 months.  MS - Stable.   Recurrent depression-PHQ 9 score of 6 today.  Overall fairly well controlled.  Happy with her Lexapro.  We will continue current regimen and follow-up in 6 months.  Put off on therapy or counseling but if at some point she feels this would be helpful especially as she goes through a divorce she will give [place] a call back and let us know.  Right knee pain -given handout for some quad strengthening exercises.  If it is not improving and recommend x-ray for further evaluation she does call back if she would like to do that.

## 2017-09-04 ENCOUNTER — Telehealth: Payer: Self-pay

## 2017-09-04 ENCOUNTER — Ambulatory Visit (INDEPENDENT_AMBULATORY_CARE_PROVIDER_SITE_OTHER): Payer: 59

## 2017-09-04 DIAGNOSIS — M25561 Pain in right knee: Secondary | ICD-10-CM

## 2017-09-04 NOTE — Telephone Encounter (Signed)
Order placed. She can go anytime

## 2017-09-04 NOTE — Telephone Encounter (Signed)
Pt made aware. WJC, CCMA

## 2017-09-28 ENCOUNTER — Telehealth: Payer: Self-pay

## 2017-09-28 NOTE — Telephone Encounter (Signed)
Pt called- has been using Advil for knee her but it is not touching the pain. She states she had an x-ray done on 09-04-17  Pt is wanting to know if there is anything for pain that Dr Linford Arnold could give her, that is not tramadol. Reports severe constipation from taking Tramadol in the past.   Called and left pt a msg to call back. Per Dr Shelah Lewandowsky recommendations on x-ray report, she needs to schedule with sports meds.

## 2017-09-28 NOTE — Telephone Encounter (Signed)
Pt scheduled with Dr Denyse Amass Monday 10-01-17

## 2017-10-01 ENCOUNTER — Ambulatory Visit (INDEPENDENT_AMBULATORY_CARE_PROVIDER_SITE_OTHER): Payer: 59 | Admitting: Family Medicine

## 2017-10-01 ENCOUNTER — Encounter: Payer: Self-pay | Admitting: Family Medicine

## 2017-10-01 VITALS — BP 128/79 | HR 96 | Ht 66.0 in | Wt 223.0 lb

## 2017-10-01 DIAGNOSIS — M1711 Unilateral primary osteoarthritis, right knee: Secondary | ICD-10-CM | POA: Insufficient documentation

## 2017-10-01 DIAGNOSIS — M25561 Pain in right knee: Secondary | ICD-10-CM

## 2017-10-01 DIAGNOSIS — S83206A Unspecified tear of unspecified meniscus, current injury, right knee, initial encounter: Secondary | ICD-10-CM | POA: Insufficient documentation

## 2017-10-01 DIAGNOSIS — M170 Bilateral primary osteoarthritis of knee: Secondary | ICD-10-CM | POA: Insufficient documentation

## 2017-10-01 MED ORDER — DICLOFENAC SODIUM 1 % TD GEL: 4.0000 g | Freq: Four times a day (QID) | TRANSDERMAL | 11 refills | Status: DC

## 2017-10-01 NOTE — Progress Notes (Signed)
Subjective:    I'm seeing this patient as a consultation for:  Agapito Games, MD   CC: Right knee pain  HPI: Stephanie Perkins has pain in her right knee present for about a month now.  She denies any recent injury but notes that over the last month or so she is been having to squat down to change the litter box recently.  She notes this is caused pain in the anterior aspect of her right knee.  She notes pain with climbing stairs and squatting.  She has pain with prolonged sitting with her knee bent.  She denies significant radiating pain.  She has a pertinent history for multiple sclerosis.  She does deny any significant new weakness.  She denies any locking catching or giving way.  She is tried treating her pain with ibuprofen which is helped a little along with heat and ice which also helped a little.  She had an x-ray ordered by Dr. Glade Lloyd about a month ago that was largely unremarkable.  She denies any recent injury.  No fevers or chills vomiting or diarrhea.  Past medical history, Surgical history, Family history not pertinant except as noted below, Social history, Allergies, and medications have been entered into the medical record, reviewed, and no changes needed.   Review of Systems: No headache, visual changes, nausea, vomiting, diarrhea, constipation, dizziness, abdominal pain, skin rash, fevers, chills, night sweats, weight loss, swollen lymph nodes, body aches, joint swelling, muscle aches, chest pain, shortness of breath, mood changes, visual or auditory hallucinations.   Objective:    Vitals:   10/01/17 0852  BP: 128/79  Pulse: 96   General: Well Developed, well nourished, and in no acute distress.  Neuro/Psych: Alert and oriented x3, extra-ocular muscles intact, able to move all 4 extremities, sensation grossly intact. Skin: Warm and dry, no rashes noted.  Respiratory: Not using accessory muscles, speaking in full sentences, trachea midline.  Cardiovascular: Pulses palpable,  no extremity edema. Abdomen: Does not appear distended. MSK:  Right knee no significant effusion no erythema no ecchymosis. Range of motion active 5-120 degrees passive full Crepitations present on knee extension. Nontender to palpation. Stable ligamentous exam. Minimally positive lateral McMurray's test. Intact flexion and extension strength but pain with resisted extension.  Contralateral left knee normal-appearing normal motion nontender stable ligamentous exam negative McMurray's test normal strength.  Antalgic gait present.  Procedure: Real-time Ultrasound Guided Injection of right knee Device: GE Logiq E   Images permanently stored and available for review in the ultrasound unit. Verbal informed consent obtained.  Discussed risks and benefits of procedure. Warned about infection bleeding damage to structures skin hypopigmentation and fat atrophy among others. Patient expresses understanding and agreement Time-out conducted.   Noted no overlying erythema, induration, or other signs of local infection.   Skin prepped in a sterile fashion.   Local anesthesia: Topical Ethyl chloride.   With sterile technique and under real time ultrasound guidance:  80 mg of Kenalog and 4 mL of Marcaine injected easily.   Completed without difficulty   Pain partially resolved suggesting accurate placement of the medication.   Advised to call if fevers/chills, erythema, induration, drainage, or persistent bleeding.   Images permanently stored and available for review in the ultrasound unit.  Impression: Technically successful ultrasound guided injection.      Lab and Radiology Results EXAM: RIGHT KNEE - COMPLETE 4+ VIEW  COMPARISON:  None.  FINDINGS: No evidence of fracture, dislocation, or joint effusion. No evidence of arthropathy  or other focal bone abnormality. Soft tissues are unremarkable.  IMPRESSION: Normal right knee.   Electronically Signed   By: Lupita Raider,  M.D.   On: 09/04/2017 16:22 I personally (independently) visualized and performed the interpretation of the images attached in this note.   Impression and Recommendations:    Assessment and Plan: 57 y.o. 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  Plan for trial of diclofenac gel as well as continued oral ibuprofen and prescription strength testing as needed.  Additionally work on [employer] strengthening exercises and hip abductor strengthening exercises.  Continue ice and rest as needed.  Injection today as above.  Recheck in 6 weeks.  If not better consider physical therapy versus MRI.  Return sooner if needed..   No orders of the defined types were placed in this encounter.  Meds ordered this encounter  Medications  . diclofenac sodium (VOLTAREN) 1 % GEL    Sig: Apply 4 g topically 4 (four) times daily. To affected joint.    Dispense:  100 g    Refill:  11    Pt tried and failed Ibuprofen, tylenol and naproxen    Discussed warning signs or symptoms. Please see discharge instructions. Patient expresses understanding.

## 2017-10-01 NOTE — Patient Instructions (Signed)
Thank you for coming in today. Call or go to the ER if you develop a large red swollen joint with extreme pain or oozing puss.  Apply the gel 4x daily.  Recheck sooner if needed.  Do the straight leg raise and toe out straight leg raise and side leg raise.  Recheck with me in 6 weeks or so.  Return sooner if needed.  Avoid deep knee bends and squats.    Patellofemoral Pain Syndrome Patellofemoral pain syndrome is a condition that involves a softening or breakdown of the tissue (cartilage) on the underside of your kneecap (patella). This causes pain in the front of the knee. The condition is also called runner's knee or chondromalacia patella. Patellofemoral pain syndrome is most common in young adults who are active in sports. Your knee is the largest joint in your body. The patella covers the front of your knee and is attached to muscles above and below your knee. The underside of the patella is covered with a smooth type of cartilage (synovium). The smooth surface helps the patella glide easily when you move your knee. Patellofemoral pain syndrome causes swelling in the joint linings and bone surfaces in your knee. What are the causes? Patellofemoral pain syndrome can be caused by:  Overuse.  Poor alignment of your knee joints.  Weak leg muscles.  A direct blow to your kneecap.  What increases the risk? You may be at risk for patellofemoral pain syndrome if you:  Do a lot of activities that can wear down your kneecap. These include: ? Running. ? Squatting. ? Climbing stairs.  Start a new physical activity or exercise program.  Wear shoes that do not fit well.  Do not have good leg strength.  Are overweight.  What are the signs or symptoms? Knee pain is the most common symptom of patellofemoral pain syndrome. This may feel like a dull, aching pain underneath your patella, in the front of your knee. There may be a popping or cracking sound when you move your knee. Pain may  get worse with:  Exercise.  Climbing stairs.  Running.  Jumping.  Squatting.  Kneeling.  Sitting for a long time.  Moving or pushing on your patella.  How is this diagnosed? Your health care provider may be able to diagnose patellofemoral pain syndrome from your symptoms and medical history. You may be asked about your recent physical activities and which ones cause knee pain. Your health care provider may do a physical exam with certain tests to confirm the diagnosis. These may include:  Moving your patella back and forth.  Checking your range of knee motion.  Having you squat or jump to see if you have pain.  Checking the strength of your leg muscles.  An MRI of the knee may also be done. How is this treated? Patellofemoral pain syndrome can usually be treated at home with rest, ice, compression, and elevation (RICE). Other treatments may include:  Nonsteroidal anti-inflammatory drugs (NSAIDs).  Physical therapy to stretch and strengthen your leg muscles.  Shoe inserts (orthotics) to take stress off your knee.  A knee brace or knee support.  Surgery to remove damaged cartilage or move the patella to a better position. The need for surgery is rare.  Follow these instructions at home:  Take medicines only as directed by your health care provider.  Rest your knee. ? When resting, keep your knee raised above the level of your heart. ? Avoid activities that cause knee pain.  Apply  ice to the injured area: ? Put ice in a plastic bag. ? Place a towel between your skin and the bag. ? Leave the ice on for 20 minutes, 2-3 times a day.  Use splints, braces, knee supports, or walking aids as directed by your health care provider.  Perform stretching and strengthening exercises as directed by your health care provider or physical therapist.  Keep all follow-up visits as directed by your health care provider. This is important. Contact a health care provider  if:  Your symptoms get worse.  You are not improving with home care. This information is not intended to replace advice given to you by your health care provider. Make sure you discuss any questions you have with your health care provider. Document Released: 03/08/2009 Document Revised: 08/26/2015 Document Reviewed: 06/09/2013 Elsevier Interactive Patient Education  2018 [employer].

## 2017-10-28 ENCOUNTER — Other Ambulatory Visit: Payer: Self-pay | Admitting: Family Medicine

## 2017-10-30 ENCOUNTER — Other Ambulatory Visit: Payer: Self-pay | Admitting: Family Medicine

## 2017-12-18 DIAGNOSIS — Z79899 Other long term (current) drug therapy: Secondary | ICD-10-CM | POA: Diagnosis not present

## 2017-12-18 DIAGNOSIS — Z5181 Encounter for therapeutic drug level monitoring: Secondary | ICD-10-CM | POA: Diagnosis not present

## 2017-12-18 DIAGNOSIS — G35 Multiple sclerosis: Secondary | ICD-10-CM | POA: Diagnosis not present

## 2017-12-18 DIAGNOSIS — R5383 Other fatigue: Secondary | ICD-10-CM | POA: Diagnosis not present

## 2017-12-24 ENCOUNTER — Encounter: Payer: Self-pay | Admitting: Family Medicine

## 2017-12-24 ENCOUNTER — Ambulatory Visit (INDEPENDENT_AMBULATORY_CARE_PROVIDER_SITE_OTHER): Payer: Medicare HMO | Admitting: Family Medicine

## 2017-12-24 VITALS — BP 131/77 | HR 103 | Ht 66.0 in | Wt 224.0 lb

## 2017-12-24 DIAGNOSIS — I10 Essential (primary) hypertension: Secondary | ICD-10-CM | POA: Diagnosis not present

## 2017-12-24 DIAGNOSIS — E785 Hyperlipidemia, unspecified: Secondary | ICD-10-CM | POA: Diagnosis not present

## 2017-12-24 DIAGNOSIS — D510 Vitamin B12 deficiency anemia due to intrinsic factor deficiency: Secondary | ICD-10-CM | POA: Diagnosis not present

## 2017-12-24 DIAGNOSIS — G35 Multiple sclerosis: Secondary | ICD-10-CM | POA: Diagnosis not present

## 2017-12-24 MED ORDER — LISINOPRIL-HYDROCHLOROTHIAZIDE 20-12.5 MG PO TABS: 1.0000 | ORAL_TABLET | Freq: Every day | ORAL | 1 refills | Status: DC

## 2017-12-24 MED ORDER — CYANOCOBALAMIN 1000 MCG/ML IJ SOLN: 1000.0000 ug | INTRAMUSCULAR | 3 refills | Status: DC

## 2017-12-24 NOTE — Progress Notes (Signed)
Subjective:    CC: BP and lipids.    HPI: Hypertension- Pt denies chest pain, SOB, dizziness, or heart palpitations.  Taking meds as directed w/o problems.  Denies medication side effects.  She changed insurances so now needs to have 2 of her medications sent to the local [facility] which she was previously getting at mail order pharmacy.  Hyerlipidemia - tolerating statin well without any side effects or problems.  MS-she is currently on Copaxone and she unfortunately had her Medicare gap about a time she refilled it she was on the opposite side of the gap.  She was able to get a grant to cover the cost of it which was about $6000.  Past medical history, Surgical history, Family history not pertinant except as noted below, Social history, Allergies, and medications have been entered into the medical record, reviewed, and corrections made.   Review of Systems: No fevers, chills, night sweats, weight loss, chest pain, or shortness of breath.   Objective:    General: Well Developed, well nourished, and in no acute distress.  Neuro: Alert and oriented x3, extra-ocular muscles intact, sensation grossly intact.  HEENT: Normocephalic, atraumatic  Skin: Warm and dry, no rashes. Cardiac: Regular rate and rhythm, no murmurs rubs or gallops, no lower extremity edema.  Respiratory: Clear to auscultation bilaterally. Not using accessory muscles, speaking in full sentences.   Impression and Recommendations:    HTN - Well controlled. Continue current regimen. Follow up in  73months.  Medication sent for refills.  B12 deficiency-vitamin B12 prescription sent to pharmacy.  Hyperlipidemia-due to recheck lipid panel.  MS -Follows with neurology.  Fortunately she was able to get a grant to cover the cost of her medication until she got through her Medicare donut hole.

## 2018-01-18 DIAGNOSIS — I10 Essential (primary) hypertension: Secondary | ICD-10-CM | POA: Diagnosis not present

## 2018-01-18 DIAGNOSIS — E785 Hyperlipidemia, unspecified: Secondary | ICD-10-CM | POA: Diagnosis not present

## 2018-01-18 LAB — BASIC METABOLIC PANEL WITH GFR
BUN: 17 mg/dL (ref 7–25)
CHLORIDE: 103 mmol/L (ref 98–110)
CO2: 26 mmol/L (ref 20–32)
Calcium: 9.5 mg/dL (ref 8.6–10.4)
Creat: 0.7 mg/dL (ref 0.50–1.05)
GFR, Est African American: 111 mL/min/{1.73_m2} (ref >=60)
GFR, Est Non African American: 96 mL/min/{1.73_m2} (ref >=60)
GLUCOSE: 123 mg/dL — AB (ref 65–99)
Potassium: 3.9 mmol/L (ref 3.5–5.3)
Sodium: 140 mmol/L (ref 135–146)

## 2018-01-18 LAB — LIPID PANEL
Cholesterol: 244 mg/dL — ABNORMAL HIGH (ref <200)
HDL: 48 mg/dL — ABNORMAL LOW (ref >50)
LDL Cholesterol (Calc): 172 mg/dL (calc) — ABNORMAL HIGH
NON-HDL CHOLESTEROL (CALC): 196 mg/dL — AB (ref <130)
TRIGLYCERIDES: 109 mg/dL (ref <150)
Total CHOL/HDL Ratio: 5.1 (calc) — ABNORMAL HIGH (ref <5.0)

## 2018-01-18 LAB — TSH: TSH: 3.98 mIU/L (ref 0.40–4.50)

## 2018-01-23 NOTE — Progress Notes (Signed)
Call pt: thyroid stable.   Kidney and liver look good.   Cholesterol is elevated. Are you taking statin?

## 2018-01-24 ENCOUNTER — Other Ambulatory Visit: Payer: Self-pay

## 2018-01-24 MED ORDER — ATORVASTATIN CALCIUM 80 MG PO TABS: ORAL_TABLET | ORAL | 3 refills | Status: DC

## 2018-01-24 NOTE — Progress Notes (Signed)
We need to increase statin then. Please send lipitor 80mg  one tablet by mouth daily #90 3 refills.

## 2018-01-25 ENCOUNTER — Other Ambulatory Visit: Payer: Self-pay

## 2018-01-25 ENCOUNTER — Encounter: Payer: Self-pay | Admitting: Family Medicine

## 2018-01-25 ENCOUNTER — Other Ambulatory Visit: Payer: Self-pay | Admitting: Family Medicine

## 2018-01-25 MED ORDER — ATORVASTATIN CALCIUM 80 MG PO TABS: ORAL_TABLET | ORAL | 3 refills | Status: DC

## 2018-03-13 ENCOUNTER — Ambulatory Visit (INDEPENDENT_AMBULATORY_CARE_PROVIDER_SITE_OTHER): Payer: Medicare HMO | Admitting: Family Medicine

## 2018-03-13 DIAGNOSIS — Z23 Encounter for immunization: Secondary | ICD-10-CM

## 2018-05-06 ENCOUNTER — Other Ambulatory Visit: Payer: Self-pay | Admitting: Family Medicine

## 2018-05-29 ENCOUNTER — Encounter: Payer: Self-pay | Admitting: Family Medicine

## 2018-06-05 ENCOUNTER — Other Ambulatory Visit: Payer: Self-pay | Admitting: Family Medicine

## 2018-06-05 DIAGNOSIS — I10 Essential (primary) hypertension: Secondary | ICD-10-CM

## 2018-07-17 DIAGNOSIS — G35 Multiple sclerosis: Secondary | ICD-10-CM | POA: Diagnosis not present

## 2018-07-17 DIAGNOSIS — R5383 Other fatigue: Secondary | ICD-10-CM | POA: Diagnosis not present

## 2018-07-31 ENCOUNTER — Other Ambulatory Visit: Payer: Self-pay | Admitting: Family Medicine

## 2018-08-29 ENCOUNTER — Other Ambulatory Visit: Payer: Self-pay | Admitting: Neurology

## 2018-08-29 DIAGNOSIS — I10 Essential (primary) hypertension: Secondary | ICD-10-CM

## 2018-08-29 MED ORDER — LISINOPRIL-HYDROCHLOROTHIAZIDE 20-12.5 MG PO TABS: 1.0000 | ORAL_TABLET | Freq: Every day | ORAL | 0 refills | Status: DC

## 2018-10-03 ENCOUNTER — Encounter: Payer: Self-pay | Admitting: Family Medicine

## 2018-10-10 ENCOUNTER — Other Ambulatory Visit: Payer: Self-pay

## 2018-10-10 ENCOUNTER — Ambulatory Visit (INDEPENDENT_AMBULATORY_CARE_PROVIDER_SITE_OTHER): Payer: Medicare HMO | Admitting: Family Medicine

## 2018-10-10 ENCOUNTER — Encounter: Payer: Self-pay | Admitting: Family Medicine

## 2018-10-10 VITALS — BP 122/78 | HR 77 | Ht 66.0 in | Wt 234.0 lb

## 2018-10-10 DIAGNOSIS — R7301 Impaired fasting glucose: Secondary | ICD-10-CM

## 2018-10-10 DIAGNOSIS — I10 Essential (primary) hypertension: Secondary | ICD-10-CM | POA: Diagnosis not present

## 2018-10-10 DIAGNOSIS — Z1231 Encounter for screening mammogram for malignant neoplasm of breast: Secondary | ICD-10-CM | POA: Diagnosis not present

## 2018-10-10 DIAGNOSIS — Z6837 Body mass index (BMI) 37.0-37.9, adult: Secondary | ICD-10-CM | POA: Diagnosis not present

## 2018-10-10 DIAGNOSIS — G35 Multiple sclerosis: Secondary | ICD-10-CM | POA: Diagnosis not present

## 2018-10-10 DIAGNOSIS — F339 Major depressive disorder, recurrent, unspecified: Secondary | ICD-10-CM | POA: Diagnosis not present

## 2018-10-10 DIAGNOSIS — E785 Hyperlipidemia, unspecified: Secondary | ICD-10-CM | POA: Diagnosis not present

## 2018-10-10 LAB — POCT GLYCOSYLATED HEMOGLOBIN (HGB A1C): Hemoglobin A1C: 6.7 % — AB (ref 4.0–5.6)

## 2018-10-10 MED ORDER — METFORMIN HCL 500 MG PO TABS: 500.0000 mg | ORAL_TABLET | Freq: Every day | ORAL | 3 refills | Status: DC

## 2018-10-10 MED ORDER — ESCITALOPRAM OXALATE 10 MG PO TABS: 10.0000 mg | ORAL_TABLET | Freq: Every day | ORAL | 1 refills | Status: DC

## 2018-10-10 MED ORDER — LISINOPRIL-HYDROCHLOROTHIAZIDE 20-12.5 MG PO TABS: 1.0000 | ORAL_TABLET | Freq: Every day | ORAL | 1 refills | Status: DC

## 2018-10-10 NOTE — Assessment & Plan Note (Signed)
A1c elevated today.  Not well controlled.  We discussed starting low-dose metformin in addition to getting back on track with diet and exercise.  She is gained 60 pounds over the last couple years and knows that she needs to really work on getting back on track.  She says she feels like she knows what she needs to do in regards to her diet and wants to work on that over the next couple of months.

## 2018-10-10 NOTE — Progress Notes (Signed)
Established Patient Office Visit  Subjective:  Patient ID: Stephanie Perkins, female    DOB: 1960-11-13  Age: 58 y.o. MRN: [id number]  CC:  Chief Complaint  Patient presents with  . Hypertension    HPI Stephanie Perkins presents for   Hypertension- Pt denies chest pain, SOB, dizziness, or heart palpitations.  Taking meds as directed w/o problems.  Denies medication side effects.    Impaired fasting glucose-no increased thirst or urination. No symptoms consistent with hypoglycemia.  F/U depression -all she has been doing okay.  Her husband has finally moved out and so that actually has been a big stress reliever for her.  She feels like the Lexapro is doing well without any significant side effects.  Lipidemia-tolerating statin well.  We actually increased her dose last time but so far she has been doing well on it.  She would like to recheck her numbers to see where she is.   Past Medical History:  Diagnosis Date  . Depression   . Hyperlipidemia   . Hypertension   . Multiple sclerosis (HCC)   . Vision abnormalities     Past Surgical History:  Procedure Laterality Date  . ABDOMINAL HYSTERECTOMY  12-09   for fibroids  . APPENDECTOMY  1-99  . CESAREAN SECTION  9-85  . TUBAL LIGATION  2002    Family History  Problem Relation Age of Onset  . Hyperlipidemia Mother   . Hypertension Mother   . Alcohol abuse Maternal Grandfather   . Cancer Maternal Grandfather     Social History   Socioeconomic History  . Marital status: Unknown    Spouse name: Not on file  . Number of children: Not on file  . Years of education: Not on file  . Highest education level: Not on file  Occupational History  . Not on file  Social Needs  . Financial resource strain: Not on file  . Food insecurity    Worry: Not on file    Inability: Not on file  . Transportation needs    Medical: Not on file    Non-medical: Not on file  Tobacco Use  . Smoking status: Former Smoker    Packs/day: 0.50     Types: Cigarettes  . Smokeless tobacco: Never Used  Substance and Sexual Activity  . Alcohol use: No    Alcohol/week: 0.0 standard drinks  . Drug use: No  . Sexual activity: Not on file  Lifestyle  . Physical activity    Days per week: Not on file    Minutes per session: Not on file  . Stress: Not on file  Relationships  . Social Musician on phone: Not on file    Gets together: Not on file    Attends religious service: Not on file    Active member of club or organization: Not on file    Attends meetings of clubs or organizations: Not on file    Relationship status: Not on file  . Intimate partner violence    Fear of current or ex partner: Not on file    Emotionally abused: Not on file    Physically abused: Not on file    Forced sexual activity: Not on file  Other Topics Concern  . Not on file  Social History Narrative  . Not on file    Outpatient Medications Prior to Visit  Medication Sig Dispense Refill  . atorvastatin (LIPITOR) 80 MG tablet TAKE 1 TABLET BY MOUTH EVERYDAY  AT BEDTIME 90 tablet 3  . COPAXONE 40 MG/ML SOSY     . cyanocobalamin (,VITAMIN B-12,) 1000 MCG/ML injection Inject 1 mL (1,000 mcg total) into the muscle every 21 ( twenty-one) days. 4 mL 3  . modafinil (PROVIGIL) 200 MG tablet TAKE 1-2 TABLETS BY MOUTH AS NEEDED FOR FAUTIGUE  3  . escitalopram (LEXAPRO) 10 MG tablet TAKE 1 TABLET BY MOUTH EVERY DAY 90 tablet 0  . lisinopril-hydrochlorothiazide (ZESTORETIC) 20-12.5 MG tablet Take 1 tablet by mouth daily. APPOINTMENT REQUIRED FOR REFILLS 30 tablet 0   No facility-administered medications prior to visit.     Allergies  Allergen Reactions  . Crestor [Rosuvastatin] Other (See Comments)    FAtigue and myalgia  . Ocrevus [Ocrelizumab] Diarrhea    Severed diarrhea  . Penicillins     REACTION: hives    ROS Review of Systems    Objective:    Physical Exam  Constitutional: She is oriented to person, place, and time. She appears  well-developed and well-nourished.  HENT:  Head: Normocephalic and atraumatic.  Cardiovascular: Normal rate, regular rhythm and normal heart sounds.  Pulmonary/Chest: Effort normal and breath sounds normal.  Neurological: She is alert and oriented to person, place, and time.  Skin: Skin is warm and dry.  Psychiatric: She has a normal mood and affect. Her behavior is normal.    BP 122/78   Pulse 77   Ht 5\' 6"  (1.676 m)   Wt 234 lb (106.1 kg)   SpO2 97%   BMI 37.77 kg/m  Wt Readings from Last 3 Encounters:  10/10/18 234 lb (106.1 kg)  12/24/17 224 lb (101.6 kg)  10/01/17 223 lb (101.2 kg)     There are no preventive care reminders to display for this patient.  There are no preventive care reminders to display for this patient.  Lab Results  Component Value Date   TSH 3.98 01/18/2018   Lab Results  Component Value Date   WBC 8.8 06/02/2015   HGB 13.4 06/02/2015   HCT 40.9 06/02/2015   MCV 83 06/02/2015   PLT 352 06/02/2015   Lab Results  Component Value Date   NA 140 01/18/2018   K 3.9 01/18/2018   CO2 26 01/18/2018   GLUCOSE 123 (H) 01/18/2018   BUN 17 01/18/2018   CREATININE 0.70 01/18/2018   BILITOT 0.5 05/10/2017   ALKPHOS 53 10/31/2016   AST 18 05/10/2017   ALT 28 05/10/2017   PROT 6.8 05/10/2017   ALBUMIN 4.4 10/31/2016   CALCIUM 9.5 01/18/2018   Lab Results  Component Value Date   CHOL 244 (H) 01/18/2018   Lab Results  Component Value Date   HDL 48 (L) 01/18/2018   Lab Results  Component Value Date   LDLCALC 172 (H) 01/18/2018   Lab Results  Component Value Date   TRIG 109 01/18/2018   Lab Results  Component Value Date   CHOLHDL 5.1 (H) 01/18/2018   Lab Results  Component Value Date   HGBA1C 6.7 (A) 10/10/2018      Assessment & Plan:   Problem List Items Addressed This Visit      Cardiovascular and Mediastinum   HYPERTENSION, BENIGN - Primary   Relevant Medications   lisinopril-hydrochlorothiazide (ZESTORETIC) 20-12.5 MG  tablet   Other Relevant Orders   POCT glycosylated hemoglobin (Hb A1C) (Completed)   COMPLETE METABOLIC PANEL WITH GFR   Lipid panel     Endocrine   IMPAIRED FASTING GLUCOSE    A1c elevated today.  Not well controlled.  We discussed starting low-dose metformin in addition to getting back on track with diet and exercise.  She is gained 60 pounds over the last couple years and knows that she needs to really work on getting back on track.  She says she feels like she knows what she needs to do in regards to her diet and wants to work on that over the next couple of months.      Relevant Orders   POCT glycosylated hemoglobin (Hb A1C) (Completed)   COMPLETE METABOLIC PANEL WITH GFR   Lipid panel     Other   Severe obesity (BMI 35.0-39.9) with comorbidity ([place])    Discussed getting back on track with diet and exercise.  I do think she is motivated to do so.  We will follow back up in 3 months and see before making some progress.  I know when her MS flares and she has difficulty exercising it is a bit of a struggle to even get up and cook.  But certainly we will move forward in that direction and see if we can make some great strides.  In the meantime I am hoping that this will also help improve her glucose levels.      Relevant Medications   metFORMIN (GLUCOPHAGE) 500 MG tablet   Hyperlipidemia    Continue statin.  Due to recheck lipid panel since she is now on a higher strength.      Relevant Medications   lisinopril-hydrochlorothiazide (ZESTORETIC) 20-12.5 MG tablet   Other Relevant Orders   Lipid panel   Depression, recurrent ([place])    New current regimen with Lexapro.  Husband has moved out and that actually has been positive for her.  Plan to address again in 4 to 6 months.      Relevant Medications   escitalopram (LEXAPRO) 10 MG tablet    Other Visit Diagnoses    BMI 37.0-37.9, adult       Screening mammogram, encounter for       Relevant Orders   MM 3D SCREEN BREAST  BILATERAL       Meds ordered this encounter  Medications  . escitalopram (LEXAPRO) 10 MG tablet    Sig: Take 1 tablet (10 mg total) by mouth daily.    Dispense:  90 tablet    Refill:  1  . lisinopril-hydrochlorothiazide (ZESTORETIC) 20-12.5 MG tablet    Sig: Take 1 tablet by mouth daily.    Dispense:  90 tablet    Refill:  1  . metFORMIN (GLUCOPHAGE) 500 MG tablet    Sig: Take 1 tablet (500 mg total) by mouth daily with breakfast.    Dispense:  30 tablet    Refill:  3    Follow-up: Return in about 3 months (around 01/10/2019) for Diabetes follow-up.    Beatrice Lecher, MD

## 2018-10-10 NOTE — Assessment & Plan Note (Signed)
New current regimen with Lexapro.  Husband has moved out and that actually has been positive for her.  Plan to address again in 4 to 6 months.

## 2018-10-10 NOTE — Assessment & Plan Note (Signed)
Continue statin.  Due to recheck lipid panel since she is now on a higher strength.

## 2018-10-10 NOTE — Assessment & Plan Note (Signed)
Discussed getting back on track with diet and exercise.  I do think she is motivated to do so.  We will follow back up in 3 months and see before making some progress.  I know when her MS flares and she has difficulty exercising it is a bit of a struggle to even get up and cook.  But certainly we will move forward in that direction and see if we can make some great strides.  In the meantime I am hoping that this will also help improve her glucose levels.

## 2018-10-11 LAB — COMPLETE METABOLIC PANEL WITH GFR
AG Ratio: 1.8 (calc) (ref 1.0–2.5)
ALT: 20 U/L (ref 6–29)
AST: 17 U/L (ref 10–35)
Albumin: 4.6 g/dL (ref 3.6–5.1)
Alkaline phosphatase (APISO): 59 U/L (ref 37–153)
BUN: 15 mg/dL (ref 7–25)
CO2: 28 mmol/L (ref 20–32)
Calcium: 9.9 mg/dL (ref 8.6–10.4)
Chloride: 103 mmol/L (ref 98–110)
Creat: 0.6 mg/dL (ref 0.50–1.05)
GFR, Est African American: 116 mL/min/{1.73_m2} (ref >=60)
GFR, Est Non African American: 100 mL/min/{1.73_m2} (ref >=60)
Globulin: 2.5 g/dL (calc) (ref 1.9–3.7)
Glucose, Bld: 115 mg/dL — ABNORMAL HIGH (ref 65–99)
Potassium: 4.2 mmol/L (ref 3.5–5.3)
Sodium: 142 mmol/L (ref 135–146)
Total Bilirubin: 0.6 mg/dL (ref 0.2–1.2)
Total Protein: 7.1 g/dL (ref 6.1–8.1)

## 2018-10-11 LAB — LIPID PANEL
Cholesterol: 199 mg/dL (ref <200)
HDL: 47 mg/dL — ABNORMAL LOW (ref >=50)
LDL Cholesterol (Calc): 126 mg/dL (calc) — ABNORMAL HIGH
Non-HDL Cholesterol (Calc): 152 mg/dL (calc) — ABNORMAL HIGH (ref <130)
Total CHOL/HDL Ratio: 4.2 (calc) (ref <5.0)
Triglycerides: 147 mg/dL (ref <150)

## 2018-10-13 ENCOUNTER — Encounter: Payer: Self-pay | Admitting: Family Medicine

## 2018-10-15 MED ORDER — SITAGLIPTIN PHOSPHATE 50 MG PO TABS: 50.0000 mg | ORAL_TABLET | Freq: Every day | ORAL | 2 refills | Status: DC

## 2018-10-30 ENCOUNTER — Telehealth: Payer: Self-pay | Admitting: Family Medicine

## 2018-10-30 NOTE — Telephone Encounter (Signed)
Patient called into the office complaining of swelling in her feet and ankles.  She started Januvia on 10/15/18 and never had swelling before this medication.  I spoke with Dr. Madilyn Fireman and she advised me to have Kandi stop the Januvia.  Annarae will stop the medication and let us know if she continues to have swelling.  If swelling continues she will need to schedule a virtual visit.

## 2018-12-01 ENCOUNTER — Other Ambulatory Visit: Payer: Self-pay | Admitting: Family Medicine

## 2018-12-11 ENCOUNTER — Ambulatory Visit: Payer: Medicare HMO

## 2019-01-02 ENCOUNTER — Other Ambulatory Visit: Payer: Self-pay | Admitting: Physician Assistant

## 2019-01-03 ENCOUNTER — Other Ambulatory Visit: Payer: Self-pay | Admitting: *Deleted

## 2019-01-03 DIAGNOSIS — I10 Essential (primary) hypertension: Secondary | ICD-10-CM

## 2019-01-03 MED ORDER — ESCITALOPRAM OXALATE 10 MG PO TABS: 10.0000 mg | ORAL_TABLET | Freq: Every day | ORAL | 1 refills | Status: DC

## 2019-01-03 MED ORDER — LISINOPRIL-HYDROCHLOROTHIAZIDE 20-12.5 MG PO TABS: 1.0000 | ORAL_TABLET | Freq: Every day | ORAL | 1 refills | Status: DC

## 2019-01-12 ENCOUNTER — Other Ambulatory Visit: Payer: Self-pay | Admitting: Family Medicine

## 2019-01-13 ENCOUNTER — Ambulatory Visit (INDEPENDENT_AMBULATORY_CARE_PROVIDER_SITE_OTHER): Payer: Medicare HMO | Admitting: Family Medicine

## 2019-01-13 ENCOUNTER — Other Ambulatory Visit: Payer: Self-pay

## 2019-01-13 ENCOUNTER — Encounter: Payer: Self-pay | Admitting: Family Medicine

## 2019-01-13 VITALS — BP 149/70 | HR 78 | Temp 98.5°F | Ht 66.0 in | Wt 229.0 lb

## 2019-01-13 DIAGNOSIS — Z1231 Encounter for screening mammogram for malignant neoplasm of breast: Secondary | ICD-10-CM | POA: Diagnosis not present

## 2019-01-13 DIAGNOSIS — I10 Essential (primary) hypertension: Secondary | ICD-10-CM | POA: Diagnosis not present

## 2019-01-13 DIAGNOSIS — T50905A Adverse effect of unspecified drugs, medicaments and biological substances, initial encounter: Secondary | ICD-10-CM

## 2019-01-13 DIAGNOSIS — G4719 Other hypersomnia: Secondary | ICD-10-CM | POA: Diagnosis not present

## 2019-01-13 DIAGNOSIS — R7301 Impaired fasting glucose: Secondary | ICD-10-CM | POA: Diagnosis not present

## 2019-01-13 DIAGNOSIS — Z23 Encounter for immunization: Secondary | ICD-10-CM

## 2019-01-13 LAB — POCT GLYCOSYLATED HEMOGLOBIN (HGB A1C): Hemoglobin A1C: 6 % — AB (ref 4.0–5.6)

## 2019-01-13 NOTE — Assessment & Plan Note (Signed)
No symptoms of snoring.  Could be related to her MS.  She is already on Provigil during the day as well.

## 2019-01-13 NOTE — Assessment & Plan Note (Signed)
Blood pressure just mildly elevated today.  We will keep an eye on this.

## 2019-01-13 NOTE — Progress Notes (Signed)
Established Patient Office Visit  Subjective:  Patient ID: Stephanie Perkins, female    DOB: April 03, 1961  Age: 58 y.o. MRN: [id number]  CC:  Chief Complaint  Patient presents with  . IFG    HPI  Stephanie Perkins presents for    Follow-up impaired fasting glucose-has swelling in her ankle after about 11 days on Januvia so she stopped it and the swelling eventually went away.  She had it again and it caused extreme diarrhea so she eventually stopped it again. The metformin also caused diarrhea.    Hypertension- Pt denies chest pain, SOB, dizziness, or heart palpitations.  Taking meds as directed w/o problems.  Denies medication side effects.   She still just feels very tired during the day and very easily falls asleep.  She denies any significant snoring.  She thinks it is probably from the MS.  She does not feel like it is from the Lexapro she has not noticed a big difference in the fatigue level  Past Medical History:  Diagnosis Date  . Depression   . Hyperlipidemia   . Hypertension   . Multiple sclerosis (HCC)   . Vision abnormalities     Past Surgical History:  Procedure Laterality Date  . ABDOMINAL HYSTERECTOMY  12-09   for fibroids  . APPENDECTOMY  1-99  . CESAREAN SECTION  9-85  . TUBAL LIGATION  2002    Family History  Problem Relation Age of Onset  . Hyperlipidemia Mother   . Hypertension Mother   . Alcohol abuse Maternal Grandfather   . Cancer Maternal Grandfather     Social History   Socioeconomic History  . Marital status: Unknown    Spouse name: Not on file  . Number of children: Not on file  . Years of education: Not on file  . Highest education level: Not on file  Occupational History  . Not on file  Social Needs  . Financial resource strain: Not on file  . Food insecurity    Worry: Not on file    Inability: Not on file  . Transportation needs    Medical: Not on file    Non-medical: Not on file  Tobacco Use  . Smoking status: Former Smoker     Packs/day: 0.50    Types: Cigarettes  . Smokeless tobacco: Never Used  Substance and Sexual Activity  . Alcohol use: No    Alcohol/week: 0.0 standard drinks  . Drug use: No  . Sexual activity: Not on file  Lifestyle  . Physical activity    Days per week: Not on file    Minutes per session: Not on file  . Stress: Not on file  Relationships  . Social Musicianconnections    Talks on phone: Not on file    Gets together: Not on file    Attends religious service: Not on file    Active member of club or organization: Not on file    Attends meetings of clubs or organizations: Not on file    Relationship status: Not on file  . Intimate partner violence    Fear of current or ex partner: Not on file    Emotionally abused: Not on file    Physically abused: Not on file    Forced sexual activity: Not on file  Other Topics Concern  . Not on file  Social History Narrative  . Not on file    Outpatient Medications Prior to Visit  Medication Sig Dispense Refill  . atorvastatin (  LIPITOR) 80 MG tablet TAKE 1 TABLET BY MOUTH AT BEDTIME 90 tablet 0  . COPAXONE 40 MG/ML SOSY     . cyanocobalamin (,VITAMIN B-12,) 1000 MCG/ML injection INJECT 1 ML (1,000 MCG TOTAL) INTO THE MUSCLE EVERY 21 ( TWENTY-ONE) DAYS. 4 mL 3  . escitalopram (LEXAPRO) 10 MG tablet Take 1 tablet (10 mg total) by mouth daily. 90 tablet 1  . lisinopril-hydrochlorothiazide (ZESTORETIC) 20-12.5 MG tablet Take 1 tablet by mouth daily. 90 tablet 1  . modafinil (PROVIGIL) 200 MG tablet TAKE 1-2 TABLETS BY MOUTH AS NEEDED FOR FAUTIGUE  3  . sitaGLIPtin (JANUVIA) 50 MG tablet Take 1 tablet (50 mg total) by mouth daily. 30 tablet 2   No facility-administered medications prior to visit.     Allergies  Allergen Reactions  . Crestor [Rosuvastatin] Other (See Comments)    FAtigue and myalgia  . Januvia [Sitagliptin] Swelling  . Metformin And Related Other (See Comments)    Diarrrhea  . Ocrevus [Ocrelizumab] Diarrhea    Severed  diarrhea  . Penicillins     REACTION: hives    ROS Review of Systems    Objective:    Physical Exam  Constitutional: She is oriented to person, place, and time. She appears well-developed and well-nourished.  HENT:  Head: Normocephalic and atraumatic.  Cardiovascular: Normal rate, regular rhythm and normal heart sounds.  Pulmonary/Chest: Effort normal and breath sounds normal.  Neurological: She is alert and oriented to person, place, and time.  Skin: Skin is warm and dry.  Psychiatric: She has a normal mood and affect. Her behavior is normal.    BP (!) 149/70   Pulse 78   Temp 98.5 F (36.9 C) (Oral)   Ht 5\' 6"  (1.676 m)   Wt 229 lb (103.9 kg)   SpO2 96%   BMI 36.96 kg/m  Wt Readings from Last 3 Encounters:  01/13/19 229 lb (103.9 kg)  10/10/18 234 lb (106.1 kg)  12/24/17 224 lb (101.6 kg)     Health Maintenance Due  Topic Date Due  . INFLUENZA VACCINE  11/02/2018    There are no preventive care reminders to display for this patient.  Lab Results  Component Value Date   TSH 3.98 01/18/2018   Lab Results  Component Value Date   WBC 8.8 06/02/2015   HGB 13.4 06/02/2015   HCT 40.9 06/02/2015   MCV 83 06/02/2015   PLT 352 06/02/2015   Lab Results  Component Value Date   NA 142 10/10/2018   K 4.2 10/10/2018   CO2 28 10/10/2018   GLUCOSE 115 (H) 10/10/2018   BUN 15 10/10/2018   CREATININE 0.60 10/10/2018   BILITOT 0.6 10/10/2018   ALKPHOS 53 10/31/2016   AST 17 10/10/2018   ALT 20 10/10/2018   PROT 7.1 10/10/2018   ALBUMIN 4.4 10/31/2016   CALCIUM 9.9 10/10/2018   Lab Results  Component Value Date   CHOL 199 10/10/2018   Lab Results  Component Value Date   HDL 47 (L) 10/10/2018   Lab Results  Component Value Date   LDLCALC 126 (H) 10/10/2018   Lab Results  Component Value Date   TRIG 147 10/10/2018   Lab Results  Component Value Date   CHOLHDL 4.2 10/10/2018   Lab Results  Component Value Date   HGBA1C 6.0 (A) 01/13/2019       Assessment & Plan:   Problem List Items Addressed This Visit      Cardiovascular and Mediastinum   HYPERTENSION, BENIGN  Blood pressure just mildly elevated today.  We will keep an eye on this.        Endocrine   IMPAIRED FASTING GLUCOSE    A1c looks good today at 6.0.  Is actually little bit improved from previous.  Continue to work on healthy diet and trying to really increase activity level.  We discussed maybe even walking during commercial breaks if she is watching television.  She feels like her fatigue levels from her MS make it difficult to exercise.  Follow-up in 6 months.      Relevant Orders   POCT HgB A1C (Completed)     Other   Excessive daytime sleepiness    No symptoms of snoring.  Could be related to her MS.  She is already on Provigil during the day as well.       Other Visit Diagnoses    Encounter for screening mammogram for malignant neoplasm of breast    -  Primary   Relevant Orders   MM 3D SCREEN BREAST BILATERAL   Needs flu shot       Relevant Orders   Flu Vaccine QUAD 6+ mos PF IM (Fluarix Quad PF) (Completed)   Medication side effect, initial encounter         Medication side effect-discontinued just Januvia.  Added to intolerance list.  No orders of the defined types were placed in this encounter.   Follow-up: Return in about 6 months (around 07/14/2019) for pre-diabetes.    Beatrice Lecher, MD

## 2019-01-13 NOTE — Assessment & Plan Note (Signed)
A1c looks good today at 6.0.  Is actually little bit improved from previous.  Continue to work on healthy diet and trying to really increase activity level.  We discussed maybe even walking during commercial breaks if she is watching television.  She feels like her fatigue levels from her MS make it difficult to exercise.  Follow-up in 6 months.

## 2019-01-17 DIAGNOSIS — Z79899 Other long term (current) drug therapy: Secondary | ICD-10-CM | POA: Diagnosis not present

## 2019-01-17 DIAGNOSIS — Z5181 Encounter for therapeutic drug level monitoring: Secondary | ICD-10-CM | POA: Diagnosis not present

## 2019-01-17 DIAGNOSIS — R5383 Other fatigue: Secondary | ICD-10-CM | POA: Diagnosis not present

## 2019-01-17 DIAGNOSIS — G35 Multiple sclerosis: Secondary | ICD-10-CM | POA: Diagnosis not present

## 2019-01-30 ENCOUNTER — Ambulatory Visit: Payer: Medicare HMO

## 2019-02-20 ENCOUNTER — Ambulatory Visit (INDEPENDENT_AMBULATORY_CARE_PROVIDER_SITE_OTHER): Payer: Medicare HMO

## 2019-02-20 ENCOUNTER — Other Ambulatory Visit: Payer: Self-pay

## 2019-02-20 DIAGNOSIS — Z1231 Encounter for screening mammogram for malignant neoplasm of breast: Secondary | ICD-10-CM

## 2019-03-07 ENCOUNTER — Other Ambulatory Visit: Payer: Self-pay | Admitting: *Deleted

## 2019-03-07 ENCOUNTER — Encounter: Payer: Self-pay | Admitting: Family Medicine

## 2019-03-07 DIAGNOSIS — I10 Essential (primary) hypertension: Secondary | ICD-10-CM

## 2019-03-07 MED ORDER — ATORVASTATIN CALCIUM 80 MG PO TABS: ORAL_TABLET | ORAL | 3 refills | Status: DC

## 2019-03-07 MED ORDER — LISINOPRIL-HYDROCHLOROTHIAZIDE 20-12.5 MG PO TABS: 1.0000 | ORAL_TABLET | Freq: Every day | ORAL | 1 refills | Status: DC

## 2019-03-07 MED ORDER — CYANOCOBALAMIN 1000 MCG/ML IJ SOLN: 1000.0000 ug | INTRAMUSCULAR | 3 refills | Status: DC

## 2019-03-07 MED ORDER — ESCITALOPRAM OXALATE 10 MG PO TABS: 10.0000 mg | ORAL_TABLET | Freq: Every day | ORAL | 1 refills | Status: DC

## 2019-03-19 ENCOUNTER — Other Ambulatory Visit: Payer: Self-pay | Admitting: *Deleted

## 2019-03-19 MED ORDER — CYCLOBENZAPRINE HCL 10 MG PO TABS: 10.0000 mg | ORAL_TABLET | Freq: Every evening | ORAL | 0 refills | Status: DC | PRN

## 2019-03-24 ENCOUNTER — Encounter: Payer: Self-pay | Admitting: Family Medicine

## 2019-06-27 ENCOUNTER — Ambulatory Visit: Payer: Medicare HMO | Attending: Internal Medicine

## 2019-06-27 DIAGNOSIS — Z23 Encounter for immunization: Secondary | ICD-10-CM

## 2019-06-27 NOTE — Progress Notes (Signed)
   Covid-19 Vaccination Clinic  Name:  MAYELI BORNHORST    MRN: [id number] DOB: June 02, 1960  06/27/2019  Ms. Romito was observed post Covid-19 immunization for 15 minutes without incident. She was provided with Vaccine Information Sheet and instruction to access the V-Safe system.   Ms. Swanton was instructed to call 911 with any severe reactions post vaccine: Marland Kitchen Difficulty breathing  . Swelling of face and throat  . A fast heartbeat  . A bad rash all over body  . Dizziness and weakness   Immunizations Administered    Name Date Dose VIS Date Route   Pfizer COVID-19 Vaccine 06/27/2019 11:13 AM 0.3 mL 03/14/2019 Intramuscular   Manufacturer: [employer], [employer]   Lot: [id number]   NDC: [address]-2

## 2019-07-22 ENCOUNTER — Ambulatory Visit: Payer: Medicare HMO | Attending: Internal Medicine

## 2019-07-22 DIAGNOSIS — Z23 Encounter for immunization: Secondary | ICD-10-CM

## 2019-07-22 NOTE — Progress Notes (Signed)
   Covid-19 Vaccination Clinic  Name:  LOVEY CRUPI    MRN: [id number] DOB: Dec 27, 1960  07/22/2019  Ms. Seda was observed post Covid-19 immunization for 15 minutes without incident. She was provided with Vaccine Information Sheet and instruction to access the V-Safe system.   Ms. Provencio was instructed to call 911 with any severe reactions post vaccine: Marland Kitchen Difficulty breathing  . Swelling of face and throat  . A fast heartbeat  . A bad rash all over body  . Dizziness and weakness   Immunizations Administered    Name Date Dose VIS Date Route   Pfizer COVID-19 Vaccine 07/22/2019 10:51 AM 0.3 mL 05/28/2018 Intramuscular   Manufacturer: [employer], [employer]   Lot: [id number]   NDC: [address]-0

## 2019-08-01 ENCOUNTER — Other Ambulatory Visit: Payer: Self-pay | Admitting: Family Medicine

## 2019-08-08 ENCOUNTER — Other Ambulatory Visit: Payer: Self-pay | Admitting: Family Medicine

## 2019-08-08 DIAGNOSIS — I10 Essential (primary) hypertension: Secondary | ICD-10-CM

## 2019-10-08 ENCOUNTER — Other Ambulatory Visit: Payer: Self-pay | Admitting: Family Medicine

## 2019-10-15 ENCOUNTER — Encounter: Payer: Self-pay | Admitting: Family Medicine

## 2019-10-15 ENCOUNTER — Ambulatory Visit (INDEPENDENT_AMBULATORY_CARE_PROVIDER_SITE_OTHER): Payer: Medicare HMO | Admitting: Family Medicine

## 2019-10-15 ENCOUNTER — Other Ambulatory Visit: Payer: Self-pay

## 2019-10-15 VITALS — BP 131/67 | HR 61 | Ht 66.0 in | Wt 210.0 lb

## 2019-10-15 DIAGNOSIS — F339 Major depressive disorder, recurrent, unspecified: Secondary | ICD-10-CM

## 2019-10-15 DIAGNOSIS — I10 Essential (primary) hypertension: Secondary | ICD-10-CM

## 2019-10-15 DIAGNOSIS — Z6833 Body mass index (BMI) 33.0-33.9, adult: Secondary | ICD-10-CM | POA: Diagnosis not present

## 2019-10-15 DIAGNOSIS — R7301 Impaired fasting glucose: Secondary | ICD-10-CM

## 2019-10-15 DIAGNOSIS — E785 Hyperlipidemia, unspecified: Secondary | ICD-10-CM | POA: Diagnosis not present

## 2019-10-15 LAB — POCT GLYCOSYLATED HEMOGLOBIN (HGB A1C): Hemoglobin A1C: 6 % — AB (ref 4.0–5.6)

## 2019-10-15 NOTE — Progress Notes (Signed)
Established Patient Office Visit  Subjective:  Patient ID: Stephanie Perkins, female    DOB: June 05, 1960  Age: 59 y.o. MRN: [id number]  CC:  Chief Complaint  Patient presents with  . Hypertension  . ifg    HPI Stephanie Perkins presents for   Hypertension- Pt denies chest pain, SOB, dizziness, or heart palpitations.  Taking meds as directed w/o problems.  Denies medication side effects.   Impaired fasting glucose-no increased thirst or urination. No symptoms consistent with hypoglycemia.  Hyperlipidemia - tolerating stating well with no myalgias or significant side effects.  Lab Results  Component Value Date   CHOL 199 10/10/2018   HDL 47 (L) 10/10/2018   LDLCALC 126 (H) 10/10/2018   TRIG 147 10/10/2018   CHOLHDL 4.2 10/10/2018      Past Medical History:  Diagnosis Date  . Depression   . Hyperlipidemia   . Hypertension   . Multiple sclerosis (HCC)   . Vision abnormalities     Past Surgical History:  Procedure Laterality Date  . ABDOMINAL HYSTERECTOMY  12-09   for fibroids  . APPENDECTOMY  1-99  . CESAREAN SECTION  9-85  . TUBAL LIGATION  2002    Family History  Problem Relation Age of Onset  . Hyperlipidemia Mother   . Hypertension Mother   . Alcohol abuse Maternal Grandfather   . Cancer Maternal Grandfather     Social History   Socioeconomic History  . Marital status: Divorced    Spouse name: Not on file  . Number of children: Not on file  . Years of education: Not on file  . Highest education level: Not on file  Occupational History  . Not on file  Tobacco Use  . Smoking status: Former Smoker    Packs/day: 0.50    Types: Cigarettes  . Smokeless tobacco: Never Used  Substance and Sexual Activity  . Alcohol use: No    Alcohol/week: 0.0 standard drinks  . Drug use: No  . Sexual activity: Not on file  Other Topics Concern  . Not on file  Social History Narrative  . Not on file   Social Determinants of Health   Financial Resource Strain:    . Difficulty of Paying Living Expenses:   Food Insecurity:   . Worried About Programme researcher, broadcasting/film/video in the Last Year:   . Barista in the Last Year:   Transportation Needs:   . Freight forwarder (Medical):   Marland Kitchen Lack of Transportation (Non-Medical):   Physical Activity:   . Days of Exercise per Week:   . Minutes of Exercise per Session:   Stress:   . Feeling of Stress :   Social Connections:   . Frequency of Communication with Friends and Family:   . Frequency of Social Gatherings with Friends and Family:   . Attends Religious Services:   . Active Member of Clubs or Organizations:   . Attends Banker Meetings:   Marland Kitchen Marital Status:   Intimate Partner Violence:   . Fear of Current or Ex-Partner:   . Emotionally Abused:   Marland Kitchen Physically Abused:   . Sexually Abused:     Outpatient Medications Prior to Visit  Medication Sig Dispense Refill  . atorvastatin (LIPITOR) 80 MG tablet TAKE 1 TABLET BY MOUTH AT BEDTIME 90 tablet 3  . COPAXONE 40 MG/ML SOSY     . cyanocobalamin (,VITAMIN B-12,) 1000 MCG/ML injection Inject 1 mL (1,000 mcg total) into the muscle every  21 ( twenty-one) days. 4 mL 3  . escitalopram (LEXAPRO) 10 MG tablet TAKE 1 TABLET (10 MG TOTAL) BY MOUTH DAILY. 90 tablet 1  . lisinopril-hydrochlorothiazide (ZESTORETIC) 20-12.5 MG tablet Take 1 tablet by mouth daily. 90 tablet 0  . modafinil (PROVIGIL) 200 MG tablet TAKE 1-2 TABLETS BY MOUTH AS NEEDED FOR FAUTIGUE  3  . cyclobenzaprine (FLEXERIL) 10 MG tablet Take 1 tablet (10 mg total) by mouth at bedtime as needed for muscle spasms. 90 tablet 0   No facility-administered medications prior to visit.    Allergies  Allergen Reactions  . Crestor [Rosuvastatin] Other (See Comments)    FAtigue and myalgia  . Januvia [Sitagliptin] Swelling  . Metformin And Related Other (See Comments)    Diarrrhea  . Ocrevus [Ocrelizumab] Diarrhea    Severed diarrhea  . Penicillins     REACTION: hives    ROS Review  of Systems    Objective:    Physical Exam Constitutional:      Appearance: She is well-developed.  HENT:     Head: Normocephalic and atraumatic.  Cardiovascular:     Rate and Rhythm: Normal rate and regular rhythm.     Heart sounds: Normal heart sounds.  Pulmonary:     Effort: Pulmonary effort is normal.     Breath sounds: Normal breath sounds.  Skin:    General: Skin is warm and dry.  Neurological:     Mental Status: She is alert and oriented to person, place, and time.  Psychiatric:        Behavior: Behavior normal.     BP 131/67   Pulse 61   Ht 5\' 6"  (1.676 m)   Wt 210 lb (95.3 kg)   SpO2 98%   BMI 33.89 kg/m  Wt Readings from Last 3 Encounters:  10/15/19 210 lb (95.3 kg)  01/13/19 229 lb (103.9 kg)  10/10/18 234 lb (106.1 kg)     There are no preventive care reminders to display for this patient.  There are no preventive care reminders to display for this patient.  Lab Results  Component Value Date   TSH 3.98 01/18/2018   Lab Results  Component Value Date   WBC 8.8 06/02/2015   HGB 13.4 06/02/2015   HCT 40.9 06/02/2015   MCV 83 06/02/2015   PLT 352 06/02/2015   Lab Results  Component Value Date   NA 142 10/10/2018   K 4.2 10/10/2018   CO2 28 10/10/2018   GLUCOSE 115 (H) 10/10/2018   BUN 15 10/10/2018   CREATININE 0.60 10/10/2018   BILITOT 0.6 10/10/2018   ALKPHOS 53 10/31/2016   AST 17 10/10/2018   ALT 20 10/10/2018   PROT 7.1 10/10/2018   ALBUMIN 4.4 10/31/2016   CALCIUM 9.9 10/10/2018   Lab Results  Component Value Date   CHOL 199 10/10/2018   Lab Results  Component Value Date   HDL 47 (L) 10/10/2018   Lab Results  Component Value Date   LDLCALC 126 (H) 10/10/2018   Lab Results  Component Value Date   TRIG 147 10/10/2018   Lab Results  Component Value Date   CHOLHDL 4.2 10/10/2018   Lab Results  Component Value Date   HGBA1C 6.0 (A) 10/15/2019      Assessment & Plan:   Problem List Items Addressed This Visit       Cardiovascular and Mediastinum   HYPERTENSION, BENIGN - Primary    Well controlled. Continue current regimen. Follow up in  6 months.  Relevant Orders   COMPLETE METABOLIC PANEL WITH GFR   Lipid panel     Endocrine   IMPAIRED FASTING GLUCOSE    Well controlled. Continue current regimen. Follow up in  6 mo.   we discussed goal setting and getting back on track with weight loss.      Relevant Orders   POCT glycosylated hemoglobin (Hb A1C) (Completed)   COMPLETE METABOLIC PANEL WITH GFR   Lipid panel     Other   Hyperlipidemia    Due to recheck lipids and liver enzymes.      Relevant Orders   COMPLETE METABOLIC PANEL WITH GFR   Lipid panel   Depression, recurrent (HCC)    Discussed option she is actually interested in tapering off of her Lexapro as requested down to 5 mg for at least a month, and maybe even potentially 2 to 3 months.  If at that point she is doing well then she can take 1 every other day for 2 weeks and then taper off completely.  If she feels at any point that we need to go back up to 10 mg we can.  Up in 6 months.       Other Visit Diagnoses    BMI 33.0-33.9,adult         Discuss strategies around weight loss.  She actually tried noon for a short period of time but was having some difficulty really focusing and comprehending material.  She says about 10 years ago she actually did well with a low-carb diet so we discussed some strategies around that and encouraged her to get back on that.  I would love to see her get down to about 180 pounds.  Think it would make a big difference in her blood pressure levels as well as her overall energy level stamina and cardiac health.  No orders of the defined types were placed in this encounter.   Follow-up: Return in about 6 months (around 04/16/2020) for Hypertension and IFG.    Nani Gasser, MD

## 2019-10-15 NOTE — Assessment & Plan Note (Signed)
Discussed option she is actually interested in tapering off of her Lexapro as requested down to 5 mg for at least a month, and maybe even potentially 2 to 3 months.  If at that point she is doing well then she can take 1 every other day for 2 weeks and then taper off completely.  If she feels at any point that we need to go back up to 10 mg we can.  Up in 6 months.

## 2019-10-15 NOTE — Assessment & Plan Note (Signed)
Well controlled. Continue current regimen. Follow up in  6 months.  

## 2019-10-15 NOTE — Patient Instructions (Signed)
Ok to cut the lexapro in half and take half a tab daily. If you are doing well over 1-2 months you can taper to 1/2 a tab every other day for 2 weeks and then come off completely if you would like.

## 2019-10-15 NOTE — Assessment & Plan Note (Signed)
Due to recheck lipids and liver enzymes. 

## 2019-10-15 NOTE — Assessment & Plan Note (Addendum)
Well controlled. Continue current regimen. Follow up in  6 mo.   we discussed goal setting and getting back on track with weight loss.

## 2019-10-21 DIAGNOSIS — E785 Hyperlipidemia, unspecified: Secondary | ICD-10-CM | POA: Diagnosis not present

## 2019-10-21 DIAGNOSIS — I10 Essential (primary) hypertension: Secondary | ICD-10-CM | POA: Diagnosis not present

## 2019-10-21 DIAGNOSIS — R7301 Impaired fasting glucose: Secondary | ICD-10-CM | POA: Diagnosis not present

## 2019-10-22 LAB — COMPLETE METABOLIC PANEL WITH GFR
AG Ratio: 1.8 (calc) (ref 1.0–2.5)
ALT: 17 U/L (ref 6–29)
AST: 15 U/L (ref 10–35)
Albumin: 4.2 g/dL (ref 3.6–5.1)
Alkaline phosphatase (APISO): 65 U/L (ref 37–153)
BUN: 15 mg/dL (ref 7–25)
CO2: 26 mmol/L (ref 20–32)
Calcium: 9 mg/dL (ref 8.6–10.4)
Chloride: 105 mmol/L (ref 98–110)
Creat: 0.6 mg/dL (ref 0.50–1.05)
GFR, Est African American: 116 mL/min/{1.73_m2} (ref >=60)
GFR, Est Non African American: 100 mL/min/{1.73_m2} (ref >=60)
Globulin: 2.4 g/dL (calc) (ref 1.9–3.7)
Glucose, Bld: 102 mg/dL — ABNORMAL HIGH (ref 65–99)
Potassium: 4.2 mmol/L (ref 3.5–5.3)
Sodium: 140 mmol/L (ref 135–146)
Total Bilirubin: 0.6 mg/dL (ref 0.2–1.2)
Total Protein: 6.6 g/dL (ref 6.1–8.1)

## 2019-10-22 LAB — LIPID PANEL
Cholesterol: 220 mg/dL — ABNORMAL HIGH (ref <200)
HDL: 47 mg/dL — ABNORMAL LOW (ref >=50)
LDL Cholesterol (Calc): 146 mg/dL (calc) — ABNORMAL HIGH
Non-HDL Cholesterol (Calc): 173 mg/dL (calc) — ABNORMAL HIGH (ref <130)
Total CHOL/HDL Ratio: 4.7 (calc) (ref <5.0)
Triglycerides: 148 mg/dL (ref <150)

## 2019-10-23 ENCOUNTER — Other Ambulatory Visit: Payer: Self-pay | Admitting: Family Medicine

## 2019-10-23 DIAGNOSIS — I10 Essential (primary) hypertension: Secondary | ICD-10-CM

## 2019-11-26 DIAGNOSIS — H524 Presbyopia: Secondary | ICD-10-CM | POA: Diagnosis not present

## 2019-11-26 DIAGNOSIS — H5213 Myopia, bilateral: Secondary | ICD-10-CM | POA: Diagnosis not present

## 2020-01-05 ENCOUNTER — Other Ambulatory Visit: Payer: Self-pay | Admitting: Family Medicine

## 2020-01-05 DIAGNOSIS — I10 Essential (primary) hypertension: Secondary | ICD-10-CM

## 2020-01-19 DIAGNOSIS — R5383 Other fatigue: Secondary | ICD-10-CM | POA: Diagnosis not present

## 2020-01-19 DIAGNOSIS — G35 Multiple sclerosis: Secondary | ICD-10-CM | POA: Diagnosis not present

## 2020-01-19 DIAGNOSIS — R270 Ataxia, unspecified: Secondary | ICD-10-CM | POA: Diagnosis not present

## 2020-01-19 DIAGNOSIS — Z5181 Encounter for therapeutic drug level monitoring: Secondary | ICD-10-CM | POA: Diagnosis not present

## 2020-01-19 DIAGNOSIS — Z79899 Other long term (current) drug therapy: Secondary | ICD-10-CM | POA: Diagnosis not present

## 2020-02-04 ENCOUNTER — Other Ambulatory Visit: Payer: Self-pay | Admitting: Family Medicine

## 2020-02-04 DIAGNOSIS — Z1231 Encounter for screening mammogram for malignant neoplasm of breast: Secondary | ICD-10-CM

## 2020-02-15 ENCOUNTER — Other Ambulatory Visit: Payer: Self-pay | Admitting: Family Medicine

## 2020-03-20 ENCOUNTER — Other Ambulatory Visit: Payer: Self-pay | Admitting: Family Medicine

## 2020-03-20 DIAGNOSIS — I10 Essential (primary) hypertension: Secondary | ICD-10-CM

## 2020-04-08 ENCOUNTER — Ambulatory Visit: Payer: Medicare HMO

## 2020-04-15 ENCOUNTER — Other Ambulatory Visit: Payer: Self-pay

## 2020-04-15 ENCOUNTER — Ambulatory Visit (INDEPENDENT_AMBULATORY_CARE_PROVIDER_SITE_OTHER): Payer: Medicare HMO | Admitting: Family Medicine

## 2020-04-15 ENCOUNTER — Encounter: Payer: Self-pay | Admitting: Family Medicine

## 2020-04-15 VITALS — BP 163/62 | HR 79 | Ht 66.0 in | Wt 222.0 lb

## 2020-04-15 DIAGNOSIS — G35 Multiple sclerosis: Secondary | ICD-10-CM

## 2020-04-15 DIAGNOSIS — R7301 Impaired fasting glucose: Secondary | ICD-10-CM | POA: Diagnosis not present

## 2020-04-15 DIAGNOSIS — Z23 Encounter for immunization: Secondary | ICD-10-CM

## 2020-04-15 DIAGNOSIS — I10 Essential (primary) hypertension: Secondary | ICD-10-CM | POA: Diagnosis not present

## 2020-04-15 DIAGNOSIS — F339 Major depressive disorder, recurrent, unspecified: Secondary | ICD-10-CM | POA: Diagnosis not present

## 2020-04-15 LAB — POCT GLYCOSYLATED HEMOGLOBIN (HGB A1C): Hemoglobin A1C: 6.2 % — AB (ref 4.0–5.6)

## 2020-04-15 MED ORDER — ESCITALOPRAM OXALATE 10 MG PO TABS: ORAL_TABLET | ORAL | 1 refills | Status: DC

## 2020-04-15 NOTE — Assessment & Plan Note (Signed)
She had actually opted to wean off of Lexapro after I last saw her but noticed that she just was very tearful off of the medication.  So since then she has restarted it and she has been back on a full dose for about 2 months.  She does feel like that is been helpful.  Again just really struggles with significant fatigue which really impacts her quality of life.

## 2020-04-15 NOTE — Assessment & Plan Note (Signed)
A1C is 6.2 today which is up a little bit from previous of 6.0.  Just encouraged her to really continue to work on healthy diet.  We even discussed maybe doing some water aerobic exercises at the local gym which I think would be great for her.

## 2020-04-15 NOTE — Assessment & Plan Note (Addendum)
She has been on Copaxone for some time now and feels like she is stable on it which is really struggles with her energy levels and her get up and she says even things like coming to the doctor's office or going to the grocery store  are just very taxing on her and she will feel more tired for couple days afterwards.

## 2020-04-15 NOTE — Progress Notes (Signed)
Established Patient Office Visit  Subjective:  Patient ID: Stephanie Perkins, female    DOB: 04/06/1960  Age: 60 y.o. MRN: [id number]  CC:  Chief Complaint  Patient presents with  . Hypertension  . ifg    HPI Stephanie Perkins presents for 6 mo f/u    Hypertension- Pt denies chest pain, SOB, dizziness, or heart palpitations.  Taking meds as directed w/o problems.  Denies medication side effects.    Impaired fasting glucose-no increased thirst or urination. No symptoms consistent with hypoglycemia.   Past Medical History:  Diagnosis Date  . Depression   . Hyperlipidemia   . Hypertension   . Multiple sclerosis (HCC)   . Vision abnormalities     Past Surgical History:  Procedure Laterality Date  . ABDOMINAL HYSTERECTOMY  12-09   for fibroids  . APPENDECTOMY  1-99  . CESAREAN SECTION  9-85  . TUBAL LIGATION  2002    Family History  Problem Relation Age of Onset  . Hyperlipidemia Mother   . Hypertension Mother   . Alcohol abuse Maternal Grandfather   . Cancer Maternal Grandfather     Social History   Socioeconomic History  . Marital status: Divorced    Spouse name: Not on file  . Number of children: Not on file  . Years of education: Not on file  . Highest education level: Not on file  Occupational History  . Not on file  Tobacco Use  . Smoking status: Former Smoker    Packs/day: 0.50    Types: Cigarettes  . Smokeless tobacco: Never Used  Substance and Sexual Activity  . Alcohol use: No    Alcohol/week: 0.0 standard drinks  . Drug use: No  . Sexual activity: Not on file  Other Topics Concern  . Not on file  Social History Narrative  . Not on file   Social Determinants of Health   Financial Resource Strain: Not on file  Food Insecurity: Not on file  Transportation Needs: Not on file  Physical Activity: Not on file  Stress: Not on file  Social Connections: Not on file  Intimate Partner Violence: Not on file    Outpatient Medications Prior to  Visit  Medication Sig Dispense Refill  . atorvastatin (LIPITOR) 80 MG tablet TAKE 1 TABLET BY MOUTH AT BEDTIME 90 tablet 3  . Cholecalciferol (VITAMIN D-3) 125 MCG (5000 UT) TABS Take 1 tablet by mouth daily.    Marland Kitchen COPAXONE 40 MG/ML SOSY     . cyanocobalamin (,VITAMIN B-12,) 1000 MCG/ML injection INJECT 1 ML (1,000 MCG TOTAL) INTO THE MUSCLE EVERY 21 ( TWENTY-ONE) DAYS. 4 mL 3  . lisinopril-hydrochlorothiazide (ZESTORETIC) 20-12.5 MG tablet TAKE 1 TABLET EVERY DAY 90 tablet 1  . modafinil (PROVIGIL) 200 MG tablet TAKE 1-2 TABLETS BY MOUTH AS NEEDED FOR FAUTIGUE  3  . escitalopram (LEXAPRO) 10 MG tablet TAKE 1 TABLET (10 MG TOTAL) BY MOUTH DAILY. 90 tablet 1   No facility-administered medications prior to visit.    Allergies  Allergen Reactions  . Crestor [Rosuvastatin] Other (See Comments)    FAtigue and myalgia  . Januvia [Sitagliptin] Swelling  . Metformin And Related Other (See Comments)    Diarrrhea  . Ocrevus [Ocrelizumab] Diarrhea    Severed diarrhea  . Penicillins     REACTION: hives    ROS Review of Systems    Objective:    Physical Exam Constitutional:      Appearance: She is well-developed and well-nourished.  HENT:  Head: Normocephalic and atraumatic.  Cardiovascular:     Rate and Rhythm: Normal rate and regular rhythm.     Heart sounds: Normal heart sounds.  Pulmonary:     Effort: Pulmonary effort is normal.     Breath sounds: Normal breath sounds.  Skin:    General: Skin is warm and dry.  Neurological:     Mental Status: She is alert and oriented to person, place, and time.  Psychiatric:        Mood and Affect: Mood and affect normal.        Behavior: Behavior normal.     BP (!) 163/62   Pulse 79   Ht 5\' 6"  (1.676 m)   Wt 222 lb (100.7 kg)   SpO2 97%   BMI 35.83 kg/m  Wt Readings from Last 3 Encounters:  04/15/20 222 lb (100.7 kg)  10/15/19 210 lb (95.3 kg)  01/13/19 229 lb (103.9 kg)     Health Maintenance Due  Topic Date Due  .  COVID-19 Vaccine (3 - Booster for Pfizer series) 01/21/2020    There are no preventive care reminders to display for this patient.  Lab Results  Component Value Date   TSH 3.98 01/18/2018   Lab Results  Component Value Date   WBC 8.8 06/02/2015   HGB 13.4 06/02/2015   HCT 40.9 06/02/2015   MCV 83 06/02/2015   PLT 352 06/02/2015   Lab Results  Component Value Date   NA 140 04/15/2020   K 4.3 04/15/2020   CO2 27 04/15/2020   GLUCOSE 98 04/15/2020   BUN 16 04/15/2020   CREATININE 0.64 04/15/2020   BILITOT 0.6 10/21/2019   ALKPHOS 53 10/31/2016   AST 15 10/21/2019   ALT 17 10/21/2019   PROT 6.6 10/21/2019   ALBUMIN 4.4 10/31/2016   CALCIUM 9.7 04/15/2020   Lab Results  Component Value Date   CHOL 220 (H) 10/21/2019   Lab Results  Component Value Date   HDL 47 (L) 10/21/2019   Lab Results  Component Value Date   LDLCALC 146 (H) 10/21/2019   Lab Results  Component Value Date   TRIG 148 10/21/2019   Lab Results  Component Value Date   CHOLHDL 4.7 10/21/2019   Lab Results  Component Value Date   HGBA1C 6.2 (A) 04/15/2020      Assessment & Plan:   Problem List Items Addressed This Visit      Cardiovascular and Mediastinum   HYPERTENSION, BENIGN - Primary    Pressure is uncontrolled today.  Recommend follow-up in 2 weeks for nurse visit to recheck.  She is due for BMP.      Relevant Orders   BASIC METABOLIC PANEL WITH GFR (Completed)     Endocrine   IMPAIRED FASTING GLUCOSE    A1C is 6.2 today which is up a little bit from previous of 6.0.  Just encouraged her to really continue to work on healthy diet.  We even discussed maybe doing some water aerobic exercises at the local gym which I think would be great for her.      Relevant Orders   POCT glycosylated hemoglobin (Hb A1C) (Completed)     Nervous and Auditory   MULTIPLE SCLEROSIS    She has been on Copaxone for some time now and feels like she is stable on it which is really struggles with her  energy levels and her get up and she says even things like coming to the doctor's office or going  to the grocery store  are just very taxing on her and she will feel more tired for couple days afterwards.        Other   Depression, recurrent (HCC)    She had actually opted to wean off of Lexapro after I last saw her but noticed that she just was very tearful off of the medication.  So since then she has restarted it and she has been back on a full dose for about 2 months.  She does feel like that is been helpful.  Again just really struggles with significant fatigue which really impacts her quality of life.      Relevant Medications   escitalopram (LEXAPRO) 10 MG tablet    Other Visit Diagnoses    Need for immunization against influenza       Relevant Orders   Flu Vaccine QUAD 36+ mos IM (Completed)      Meds ordered this encounter  Medications  . escitalopram (LEXAPRO) 10 MG tablet    Sig: TAKE 1 TABLET (10 MG TOTAL) BY MOUTH DAILY.    Dispense:  90 tablet    Refill:  1    Follow-up: Return in about 6 months (around 10/13/2020) for Hypertension and prediabetes .    Nani Gasser, MD

## 2020-04-16 ENCOUNTER — Encounter: Payer: Self-pay | Admitting: Family Medicine

## 2020-04-16 LAB — BASIC METABOLIC PANEL WITH GFR
BUN: 16 mg/dL (ref 7–25)
CO2: 27 mmol/L (ref 20–32)
Calcium: 9.7 mg/dL (ref 8.6–10.4)
Chloride: 102 mmol/L (ref 98–110)
Creat: 0.64 mg/dL (ref 0.50–0.99)
GFR, Est African American: 112 mL/min/{1.73_m2} (ref >=60)
GFR, Est Non African American: 97 mL/min/{1.73_m2} (ref >=60)
Glucose, Bld: 98 mg/dL (ref 65–139)
Potassium: 4.3 mmol/L (ref 3.5–5.3)
Sodium: 140 mmol/L (ref 135–146)

## 2020-04-16 NOTE — Progress Notes (Signed)
All labs are normal. 

## 2020-04-16 NOTE — Assessment & Plan Note (Signed)
Pressure is uncontrolled today.  Recommend follow-up in 2 weeks for nurse visit to recheck.  She is due for BMP.

## 2020-05-03 ENCOUNTER — Other Ambulatory Visit: Payer: Self-pay | Admitting: Family Medicine

## 2020-05-05 ENCOUNTER — Other Ambulatory Visit: Payer: Self-pay | Admitting: Family Medicine

## 2020-05-05 DIAGNOSIS — F339 Major depressive disorder, recurrent, unspecified: Secondary | ICD-10-CM

## 2020-05-12 ENCOUNTER — Ambulatory Visit (INDEPENDENT_AMBULATORY_CARE_PROVIDER_SITE_OTHER): Payer: Medicare HMO

## 2020-05-12 ENCOUNTER — Other Ambulatory Visit: Payer: Self-pay

## 2020-05-12 ENCOUNTER — Ambulatory Visit: Payer: Medicare HMO

## 2020-05-12 DIAGNOSIS — Z1231 Encounter for screening mammogram for malignant neoplasm of breast: Secondary | ICD-10-CM

## 2020-05-19 ENCOUNTER — Encounter: Payer: Self-pay | Admitting: Family Medicine

## 2020-08-16 ENCOUNTER — Telehealth: Payer: Self-pay | Admitting: Family Medicine

## 2020-08-16 NOTE — Chronic Care Management (AMB) (Signed)
  Chronic Care Management   Note  08/16/2020 Name: OLETHA TOLSON MRN: [id number] DOB: [birth year]  SONOMA FIRKUS is a 60 y.o. year old female who is a primary care patient of Agapito Games, MD. I reached out to Denton Lank by phone today in response to a referral sent by Ms. Rolin Barry Duke's PCP, Agapito Games, MD.   Ms. Tonner was given information about Chronic Care Management services today including:  1. CCM service includes personalized support from designated clinical staff supervised by her physician, including individualized plan of care and coordination with other care providers 2. 24/7 contact phone numbers for assistance for urgent and routine care needs. 3. Service will only be billed when office clinical staff spend 20 minutes or more in a month to coordinate care. 4. Only one practitioner may furnish and bill the service in a calendar month. 5. The patient may stop CCM services at any time (effective at the end of the month) by phone call to the office staff.   Patient agreed to services and verbal consent obtained.   Follow up plan:   Carmell Austria [employer] Scheduler

## 2020-08-18 ENCOUNTER — Other Ambulatory Visit: Payer: Self-pay | Admitting: Family Medicine

## 2020-08-18 DIAGNOSIS — I10 Essential (primary) hypertension: Secondary | ICD-10-CM

## 2020-08-23 NOTE — Progress Notes (Signed)
Erroneous encounter - please disregard.

## 2020-08-25 ENCOUNTER — Other Ambulatory Visit: Payer: Self-pay | Admitting: Family Medicine

## 2020-08-25 ENCOUNTER — Ambulatory Visit (INDEPENDENT_AMBULATORY_CARE_PROVIDER_SITE_OTHER): Payer: Medicare HMO | Admitting: Pharmacist

## 2020-08-25 DIAGNOSIS — I10 Essential (primary) hypertension: Secondary | ICD-10-CM | POA: Diagnosis not present

## 2020-08-25 DIAGNOSIS — E785 Hyperlipidemia, unspecified: Secondary | ICD-10-CM | POA: Diagnosis not present

## 2020-08-25 DIAGNOSIS — R7301 Impaired fasting glucose: Secondary | ICD-10-CM

## 2020-08-25 MED ORDER — LISINOPRIL-HYDROCHLOROTHIAZIDE 20-25 MG PO TABS: 1.0000 | ORAL_TABLET | Freq: Every day | ORAL | 0 refills | Status: DC

## 2020-08-25 NOTE — Patient Instructions (Signed)
Hi Indiya, it was nice meeting you today! As discussed: - Aim for green vegetable on your plate 2x per week, use freezer section/microwave steamers for convenience! - Drink 1 cup (or mug!) of water BEFORE coffee, and water throughout the day - Take BP every 1-2 weeks & write it down for [place] to talk about next time - We may change dose of BP medicine, will let you know after discussing with Dr. Madilyn Fireman - I'll chat with you again in 1 month! Call clinic (ask for pharmacist) if you have questions in the meantime!  Visit Information   PATIENT GOALS:  Goals Addressed            This Visit's Progress   . Disease Progression Prevention       Patient Goals/Self-Care Activities . Over the next 90 days, patient will:  - take medications as prescribed,  - check blood pressure every 1-2 weeks, document, and provide at future appointments and  - engage in dietary modifications by aiming for greens on the plate 2x per week, getting creative with freezer section/microwave steamers for convience  Follow Up Plan: Telephone follow up appointment with care management team member scheduled for: 1 month       Consent to CCM Services: Ms. Butrick was given information about Chronic Care Management services today including:  1. CCM service includes personalized support from designated clinical staff supervised by her physician, including individualized plan of care and coordination with other care providers 2. 24/7 contact phone numbers for assistance for urgent and routine care needs. 3. Service will only be billed when office clinical staff spend 20 minutes or more in a month to coordinate care. 4. Only one practitioner may furnish and bill the service in a calendar month. 5. The patient may stop CCM services at any time (effective at the end of the month) by phone call to the office staff. 6. The patient will be responsible for cost sharing (co-pay) of up to 20% of the service fee (after annual  deductible is met).  Patient agreed to services and verbal consent obtained.   Patient verbalizes understanding of instructions provided today and agrees to view in [place].   Telephone follow up appointment with care management team member scheduled for: 1 month  [place] CARE PLAN: Patient Care Plan: Medication Management    Problem Identified: HTN, HLD, pre-DM     Long-Range Goal: Disease Progression Prevention   Start Date: 08/23/2020  Recent Progress: On track  Priority: High  Note:   Current Barriers:  . Suboptimal therapeutic regimen for HTN, HLD  Pharmacist Clinical Goal(s):  Marland Kitchen Over the next 90 days, patient will achieve control of BP as evidenced by dose optimization and home BP resadings through collaboration with PharmD and provider.   Interventions: . 1:1 collaboration with Hali Marry, MD regarding development and update of comprehensive plan of care as evidenced by provider attestation and co-signature . Inter-disciplinary care team collaboration (see longitudinal plan of care) . Comprehensive medication review performed; medication list updated in electronic medical record  Pre-Diabetes:  . Uncontrolled; current treatment:lifestyle modifications but patient feels as though since Christmas 2021 she has given up on nutrition choices . Current meal patterns:  B: atkins bars + coffee L: leftover pizza, soft taco (+beans, cheese), cheese & crackers w/bacon, anything quick/easy  D: pizza, chicken breast, pork chop, tacos Drinks: water/coffee, tea throughout day, and margarita sometimes with dinner . Denies hypotensive/hypertensive symptoms . Extensive discussion on  SMALL changes, focusing on goal of something green on the plate 2x per week, using freezer section/microwave steamables for convenience  Hypertension: . Uncontrolled/controlled; current treatment: lisinopril-hctz 20-12.64m daily;  . Current home readings: SBP 155s,   . Denies/reports hypotensive/hypertensive symptoms . Recommended check BP every 1-2 weeks & document for [place] have data at future visits, Recommend increasing to lisinopril-hctz 20-232m Hyperlipidemia: . Uncontrolled; current treatment: atorvastatin 8015m . Medications previously tried: crestor (faPublic house manager Counseled on cholesterol management by lifestyle + taking medication Recommended continue current medication, revisit tighter cholesterol control after focus of diet + blood pressure control. In future, consider addition of zetia 57m22m Patient Goals/Self-Care Activities . Over the next 90 days, patient will:  - take medications as prescribed,  - check blood pressure every 1-2 weeks, document, and provide at future appointments and  - engage in dietary modifications by aiming for greens on the plate 2x per week, getting creative with freezer section/microwave steamers for convience  Follow Up Plan: Telephone follow up appointment with care management team member scheduled for: 1 month

## 2020-08-25 NOTE — Progress Notes (Signed)
Chronic Care Management Pharmacy Note  08/25/2020 Name:  ZEPPELIN BECKSTRAND MRN:  [id number] DOB:  1960-12-01  Subjective: Stephanie Perkins is an 60 y.o. year old female who is a primary patient of Metheney, Rene Kocher, MD.  The CCM team was consulted for assistance with disease management and care coordination needs.    Engaged with patient by telephone for initial visit in response to provider referral for pharmacy case management and/or care coordination services.   Consent to Services:  The patient was given information about Chronic Care Management services, agreed to services, and gave verbal consent prior to initiation of services.  Please see initial visit note for detailed documentation.   Patient Care Team: Hali Marry, MD as PCP - Andria Meuse, MD as Consulting Physician (Obstetrics and Gynecology) Ellwood Dense., MD as Referring Physician (Neurology) Darius Bump, [facility] as Pharmacist (Pharmacist)  Recent office visits: 04/15/20 - Dr. Madilyn Fireman (PCP): HTN, pre-DM, MS, depression f/u  Recent consult visits: 01/19/20 - Dr. Bjorn Loser (neurology): copaxone, modafinil stable for MS but considering mavenclad (cladribine)   Hospital visits: None in previous 6 months  Objective:  Lab Results  Component Value Date   CREATININE 0.64 04/15/2020   CREATININE 0.60 10/21/2019   CREATININE 0.60 10/10/2018    Lab Results  Component Value Date   HGBA1C 6.2 (A) 04/15/2020   Last diabetic Eye exam: No results found for: HMDIABEYEEXA  Last diabetic Foot exam: No results found for: HMDIABFOOTEX      Component Value Date/Time   CHOL 220 (H) 10/21/2019 0916   TRIG 148 10/21/2019 0916   HDL 47 (L) 10/21/2019 0916   CHOLHDL 4.7 10/21/2019 0916   VLDL 38 (H) 12/22/2015 1101   LDLCALC 146 (H) 10/21/2019 0916    Hepatic Function Latest Ref Rng & Units 10/21/2019 10/10/2018 05/10/2017  Total Protein 6.1 - 8.1 g/dL 6.6 7.1 6.8  Albumin 3.6 - 5.1 g/dL - - -  AST 10 - 35 U/L  15 17 18   ALT 6 - 29 U/L 17 20 28   Alk Phosphatase 33 - 130 U/L - - -  Total Bilirubin 0.2 - 1.2 mg/dL 0.6 0.6 0.5  Bilirubin, Direct 0.00 - 0.40 mg/dL - - -    Lab Results  Component Value Date/Time   TSH 3.98 01/18/2018 09:17 AM   TSH 2.57 10/31/2016 07:38 AM    CBC Latest Ref Rng & Units 06/02/2015 02/11/2013 11/15/2012  WBC 3.4 - 10.8 x10E3/uL 8.8 6.7 9.7  Hemoglobin 11.1 - 15.9 g/dL 13.4 14.4 13.9  Hematocrit 34.0 - 46.6 % 40.9 42.6 41.1  Platelets 150 - 379 x10E3/uL 352 299 258     Clinical ASCVD: Yes  The 10-year ASCVD risk score Mikey Bussing DC Jr., et al., 2013) is: 17.2%   Values used to calculate the score:     Age: 12 years     Sex: Female     Is Non-Hispanic African American: No     Diabetic: No     Tobacco smoker: Yes     Systolic Blood Pressure: 321 mmHg     Is BP treated: Yes     HDL Cholesterol: 47 mg/dL     Total Cholesterol: 220 mg/dL     Social History   Tobacco Use  Smoking Status Former Smoker  . Packs/day: 0.50  . Types: Cigarettes  Smokeless Tobacco Never Used   BP Readings from Last 3 Encounters:  04/15/20 (!) 163/62  10/15/19 131/67  01/13/19 (!) 149/70  Pulse Readings from Last 3 Encounters:  04/15/20 79  10/15/19 61  01/13/19 78   Wt Readings from Last 3 Encounters:  04/15/20 222 lb (100.7 kg)  10/15/19 210 lb (95.3 kg)  01/13/19 229 lb (103.9 kg)    Assessment: Review of patient past medical history, allergies, medications, health status, including review of consultants reports, laboratory and other test data, was performed as part of comprehensive evaluation and provision of chronic care management services.   SDOH:  (Social Determinants of Health) assessments and interventions performed:    CCM Care Plan  Allergies  Allergen Reactions  . Crestor [Rosuvastatin] Other (See Comments)    FAtigue and myalgia  . Januvia [Sitagliptin] Swelling  . Metformin And Related Other (See Comments)    Diarrrhea  . Ocrevus [Ocrelizumab]  Diarrhea    Severed diarrhea  . Penicillins     REACTION: hives    Medications Reviewed Today    Reviewed by Hali Marry, MD (Physician) on 04/16/20 at 1210  Med List Status: <None>  Medication Order Taking? Sig Documenting Provider Last Dose Status Informant  atorvastatin (LIPITOR) 80 MG tablet [id number] Yes TAKE 1 TABLET BY MOUTH AT BEDTIME Hali Marry, MD Taking Active   Cholecalciferol (VITAMIN D-3) 125 MCG (5000 UT) TABS [id number] Yes Take 1 tablet by mouth daily. [provider] Taking Active   COPAXONE 40 MG/ML SOSY [id number] Yes  [provider] Taking Active            Med Note Maryruth Eve, Leanna Battles   Wed Oct 15, 2019 11:18 AM)    cyanocobalamin (,VITAMIN B-12,) 1000 MCG/ML injection [id number] Yes INJECT 1 ML (1,000 MCG TOTAL) INTO THE MUSCLE EVERY 21 ( TWENTY-ONE) DAYS. Hali Marry, MD Taking Active   escitalopram (LEXAPRO) 10 MG tablet [id number] Yes TAKE 1 TABLET (10 MG TOTAL) BY MOUTH DAILY. Hali Marry, MD Taking Active   lisinopril-hydrochlorothiazide (ZESTORETIC) 20-12.5 MG tablet [id number] Yes TAKE 1 TABLET EVERY DAY Hali Marry, MD Taking Active   modafinil (PROVIGIL) 200 MG tablet [id number] Yes TAKE 1-2 TABLETS BY MOUTH AS NEEDED FOR [place] [provider] Taking Active            Med Note Teddy Spike   Wed Oct 15, 2019 11:18 AM)            Patient Active Problem List   Diagnosis Date Noted  . Severe obesity (BMI 35.0-39.9) with comorbidity ([place]) 10/10/2018  . 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 Claustrophobia 06/19/2017  . High risk medication use 06/02/2015  . Excessive daytime sleepiness 06/02/2015  . Snoring 06/02/2015  . Gait disturbance 06/02/2015  . Ataxia 06/02/2015  . Other fatigue 06/02/2015  . Disturbed cognition 06/02/2015  . 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 Encounter for therapeutic drug level monitoring 05/17/2015  . Fatigue 05/17/2015  . Encounter for monitoring  immunomodulating therapy 05/17/2015  . Lactose intolerance 12/01/2011  . ANEMIA, PERNICIOUS 12/23/2009  . IMPAIRED FASTING GLUCOSE 05/21/2009  . MULTIPLE SCLEROSIS 11/22/2007  . Hyperlipidemia 09/25/2007  . Depression, recurrent ([place]) 09/25/2007  . HYPERTENSION, BENIGN 09/25/2007    Immunization History  Administered Date(s) Administered  . Influenza,inj,Quad PF,6+ Mos 03/13/2018, 01/13/2019, 04/15/2020  . Influenza-Unspecified 01/21/2013  . PFIZER(Purple Top)SARS-COV-2 Vaccination 06/27/2019, 07/22/2019    Conditions to be addressed/monitored: HTN, HLD and DMII  Care Plan : Medication Management  Updates made by Darius Bump, [place] since 08/25/2020 12:00 AM    Problem: HTN, HLD, pre-DM     Long-Range  Goal: Disease Progression Prevention   Start Date: 08/23/2020  Recent Progress: On track  Priority: High  Note:   Current Barriers:  . Suboptimal therapeutic regimen for HTN, HLD  Pharmacist Clinical Goal(s):  Marland Kitchen Over the next 90 days, patient will achieve control of BP as evidenced by dose optimization and home BP resadings through collaboration with PharmD and provider.   Interventions: . 1:1 collaboration with Hali Marry, MD regarding development and update of comprehensive plan of care as evidenced by provider attestation and co-signature . Inter-disciplinary care team collaboration (see longitudinal plan of care) . Comprehensive medication review performed; medication list updated in electronic medical record  Pre-Diabetes:  . Uncontrolled; current treatment:lifestyle modifications but patient feels as though since Christmas 2021 she has given up on nutrition choices . Current meal patterns:  B: atkins bars + coffee L: leftover pizza, soft taco (+beans, cheese), cheese & crackers w/bacon, anything quick/easy  D: pizza, chicken breast, pork chop, tacos Drinks: water/coffee, tea throughout day, and margarita sometimes with dinner . Denies  hypotensive/hypertensive symptoms . Extensive discussion on SMALL changes, focusing on goal of something green on the plate 2x per week, using freezer section/microwave steamables for convenience  Hypertension: . Uncontrolled/controlled; current treatment: lisinopril-hctz 20-12.34m daily;  . Current home readings: SBP 155s,  . Denies/reports hypotensive/hypertensive symptoms . Recommended check BP every 1-2 weeks & document for [place] have data at future visits, Recommend increasing to lisinopril-hctz 20-277m Hyperlipidemia: . Uncontrolled; current treatment: atorvastatin 8028m . Medications previously tried: crestor (faPublic house manager Counseled on cholesterol management by lifestyle + taking medication Recommended continue current medication, revisit tighter cholesterol control after focus of diet + blood pressure control. In future, consider addition of zetia 77m3m Patient Goals/Self-Care Activities . Over the next 90 days, patient will:  - take medications as prescribed,  - check blood pressure every 1-2 weeks, document, and provide at future appointments and  - engage in dietary modifications by aiming for greens on the plate 2x per week, getting creative with freezer section/microwave steamers for convience  Follow Up Plan: Telephone follow up appointment with care management team member scheduled for: 1 month     Medication Assistance: None required.  Patient affirms current coverage meets needs.  [facility]  [id number] [place] [facility] [place] [id number]: [phone] [place]- [address] [id number]: [phone] Up:  Patient agrees to Care Plan and Follow-up.  Plan: Telephone follow up appointment with care management team member scheduled for:  1  month  KeeshDarius Bump

## 2020-08-26 ENCOUNTER — Encounter: Payer: Self-pay | Admitting: Family Medicine

## 2020-08-26 ENCOUNTER — Other Ambulatory Visit: Payer: Self-pay

## 2020-08-26 DIAGNOSIS — I10 Essential (primary) hypertension: Secondary | ICD-10-CM

## 2020-08-26 MED ORDER — LISINOPRIL-HYDROCHLOROTHIAZIDE 20-25 MG PO TABS: 1.0000 | ORAL_TABLET | Freq: Every day | ORAL | 0 refills | Status: DC

## 2020-09-30 ENCOUNTER — Telehealth: Payer: Self-pay | Admitting: Family Medicine

## 2020-09-30 ENCOUNTER — Ambulatory Visit (INDEPENDENT_AMBULATORY_CARE_PROVIDER_SITE_OTHER): Payer: Medicare HMO | Admitting: Pharmacist

## 2020-09-30 ENCOUNTER — Other Ambulatory Visit: Payer: Self-pay

## 2020-09-30 DIAGNOSIS — I10 Essential (primary) hypertension: Secondary | ICD-10-CM

## 2020-09-30 DIAGNOSIS — R7301 Impaired fasting glucose: Secondary | ICD-10-CM

## 2020-09-30 DIAGNOSIS — E785 Hyperlipidemia, unspecified: Secondary | ICD-10-CM | POA: Diagnosis not present

## 2020-09-30 NOTE — Telephone Encounter (Signed)
Called pt's [employer] and spoke with Verlon Au she resent the medication for the patient and stated that it will be 5-10 days.  Called and informed the pt.

## 2020-09-30 NOTE — Patient Instructions (Signed)
Visit Information  PATIENT GOALS:  Goals Addressed             This Visit's Progress    Disease Progression Prevention       Patient Goals/Self-Care Activities Over the next 90 days, patient will:  - take medications as prescribed,  - check blood pressure every 1-2 weeks, document, and provide at future appointments and  - engage in dietary modifications by aiming for greens on the plate 2x per week, getting creative with freezer section/microwave steamers for convenience, add cheese topping or dipping sauce to create more motivation.  Follow Up Plan: Telephone follow up appointment with care management team member scheduled for: 2 months         Patient verbalizes understanding of instructions provided today and agrees to view in MyChart.   Telephone follow up appointment with care management team member scheduled for:  2 months Gabriel Carina

## 2020-09-30 NOTE — Progress Notes (Signed)
Chronic Care Management Pharmacy Note  09/30/2020 Name:  Stephanie Perkins MRN:  [id number] DOB:  [birth year]  Summary: addressed HTN, preDM  Recommendations/Changes made from today's visit: Recommended check BP every 1-2 weeks & document for [place] to have data at future visits, Recommend patient to call [employer] to check on status of lisinopril-hctz 20-72m, send [place] message if there are issues. Goal is to begin lisinopril 20-22mprior to PCP appt, 10/13/20. If patient remains very elevated at PCP visit, may consider taking TWO of the lisinopril 20-12.5m69mtotal dose = lisinopril 40-21m80mily) to achieve better control.  Plan: f/u with pharmacist in 2 months  Subjective: Stephanie EVERMANan 59 y23. year old female who is a primary patient of Stephanie Perkins, Stephanie Perkins.  The CCM team was consulted for assistance with disease management and care coordination needs.    Engaged with patient by telephone for follow up visit in response to provider referral for pharmacy case management and/or care coordination services.   Consent to Services:  The patient was given information about Chronic Care Management services, agreed to services, and gave verbal consent prior to initiation of services.  Please see initial visit note for detailed documentation.   Patient Care Team: MethHali Perkins as PCP - Stephanie Perkins as Consulting Physician (Obstetrics and Gynecology) Stephanie Perkins as Referring Physician (Neurology) [facility])   Objective:  Lab Results  Component Value Date   CREATININE 0.64 04/15/2020   CREATININE 0.60 10/21/2019   CREATININE 0.60 10/10/2018    Lab Results  Component Value Date   HGBA1C 6.2 (A) 04/15/2020       Component Value Date/Time   CHOL 220 (H) 10/21/2019 0916   TRIG 148 10/21/2019 0916   HDL 47 (L) 10/21/2019 0916   CHOLHDL 4.7 10/21/2019 0916   VLDL 38 (H) 12/22/2015 1101   LDLCALC 146 (H)  10/21/2019 0916    Hepatic Function Latest Ref Rng & Units 10/21/2019 10/10/2018 05/10/2017  Total Protein 6.1 - 8.1 g/dL 6.6 7.1 6.8  Albumin 3.6 - 5.1 g/dL - - -  AST 10 - 35 U/L 15 17 18   ALT 6 - 29 U/L 17 20 28   Alk Phosphatase 33 - 130 U/L - - -  Total Bilirubin 0.2 - 1.2 mg/dL 0.6 0.6 0.5  Bilirubin, Direct 0.00 - 0.40 mg/dL - - -    Lab Results  Component Value Date/Time   TSH 3.98 01/18/2018 09:17 AM   TSH 2.57 10/31/2016 07:38 AM    CBC Latest Ref Rng & Units 06/02/2015 02/11/2013 11/15/2012  WBC 3.4 - 10.8 x10E3/uL 8.8 6.7 9.7  Hemoglobin 11.1 - 15.9 g/dL 13.4 14.4 13.9  Hematocrit 34.0 - 46.6 % 40.9 42.6 41.1  Platelets 150 - 379 x10E3/uL 352 299 258    Clinical ASCVD: Yes  The 10-year ASCVD risk score (GofMikey BussingJr., et al., 2013) is: 17.2%   Values used to calculate the score:     Age: 31 y49rs     Sex: Female     Is Non-Hispanic African American: No     Diabetic: No     Tobacco smoker: Yes     Systolic Blood Pressure: 163 583g     Is BP treated: Yes     HDL Cholesterol: 47 mg/dL     Total Cholesterol: 220 mg/dL     Social History   Tobacco Use  Smoking Status Former  Packs/day: 0.50   Pack years: 0.00   Types: Cigarettes  Smokeless Tobacco Never   BP Readings from Last 3 Encounters:  04/15/20 (!) 163/62  10/15/19 131/67  01/13/19 (!) 149/70   Pulse Readings from Last 3 Encounters:  04/15/20 79  10/15/19 61  01/13/19 78   Wt Readings from Last 3 Encounters:  04/15/20 222 lb (100.7 kg)  10/15/19 210 lb (95.3 kg)  01/13/19 229 lb (103.9 kg)    Assessment: Review of patient past medical history, allergies, medications, health status, including review of consultants reports, laboratory and other test data, was performed as part of comprehensive evaluation and provision of chronic care management services.   SDOH:  (Social Determinants of Health) assessments and interventions performed:    CCM Care Plan  Allergies  Allergen Reactions   Crestor  [Rosuvastatin] Other (See Comments)    FAtigue and myalgia   Januvia [Sitagliptin] Swelling   Metformin And Related Other (See Comments)    Diarrrhea   Ocrevus [Ocrelizumab] Diarrhea    Severed diarrhea   Penicillins     REACTION: hives    Medications Reviewed Today     Reviewed by Stephanie Perkins, [facility] (Pharmacist) on 09/30/20 at [phone]  Med List Status: <None>   Medication Order Taking? Sig Documenting Provider Last Dose Status Informant  atorvastatin (LIPITOR) 80 MG tablet [id number] Yes TAKE 1 TABLET AT BEDTIME Stephanie Marry, MD Taking Active   Cholecalciferol (VITAMIN D3 GUMMIES) 25 MCG (1000 UT) CHEW [id number] Yes Chew 1 each by mouth daily. [provider] Taking Active   COPAXONE 40 MG/ML SOSY [id number] Yes Inject 40 mg into the skin 3 (three) times a week. [provider] Taking Active            Med Note Dorene Ar Aug 25, 2020 11:16 AM) 74m SQ injection 3x per week, MWF per patient & neurology  cyanocobalamin (,VITAMIN B-12,) 1000 MCG/ML injection [id number] INJECT 1 ML (1,000 MCG TOTAL) INTO THE MUSCLE EVERY 21 ( TWENTY-ONE) DAYS. MHali Marry MD Taking Active   escitalopram (LEXAPRO) 10 MG tablet [id number] TAKE 1 TABLET (10 MG TOTAL) BY MOUTH DAILY. MHali Marry MD Taking Active   lisinopril-hydrochlorothiazide (ZESTORETIC) 20-25 MG tablet [id number] Take 1 tablet by mouth daily. 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   Claustrophobia 06/19/2017   High risk medication use 06/02/2015   Excessive daytime sleepiness 06/02/2015   Snoring 06/02/2015   Gait  disturbance 06/02/2015   Ataxia 06/02/2015   Other fatigue 06/02/2015   Disturbed cognition 06/02/2015   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   Encounter for therapeutic drug level monitoring 05/17/2015   Fatigue 05/17/2015   Encounter for monitoring immunomodulating therapy 05/17/2015   Lactose intolerance 12/01/2011   ANEMIA, PERNICIOUS 12/23/2009   IMPAIRED FASTING GLUCOSE 05/21/2009   MULTIPLE SCLEROSIS 11/22/2007   Hyperlipidemia 09/25/2007   Depression, recurrent ([place] 09/25/2007   HYPERTENSION, BENIGN 09/25/2007    Immunization History  Administered Date(s) Administered  Influenza,inj,Quad PF,6+ Mos 03/13/2018, 01/13/2019, 04/15/2020   Influenza-Unspecified 01/21/2013   PFIZER(Purple Top)SARS-COV-2 Vaccination 06/27/2019, 07/22/2019    Conditions to be addressed/monitored: HTN and HLD, preDM  Care Plan : Medication Management  Updates made by Stephanie Perkins, [place] since 09/30/2020 12:00 AM     Problem: HTN, HLD, pre-DM      Long-Range Goal: Disease Progression Prevention   Start Date: 08/23/2020  Recent Progress: On track  Priority: High  Note:   Current Barriers:  Suboptimal therapeutic regimen for HTN, HLD  Pharmacist Clinical Goal(s):  Over the next 90 days, patient will achieve control of BP as evidenced by dose optimization and home BP resadings through collaboration with PharmD and provider.   Interventions: 1:1 collaboration with Stephanie Marry, MD regarding development and update of comprehensive plan of care as evidenced by provider attestation and co-signature Inter-disciplinary care team collaboration (see longitudinal plan of care) Comprehensive medication review performed; medication list updated in electronic medical record  Pre-Diabetes:   Uncontrolled; current treatment:lifestyle modifications but patient feels as though since Christmas 2021 she has given up on nutrition choices  Previously discussed current meal patterns:  B: atkins bars +  coffee L: leftover pizza, soft taco (+beans, cheese), cheese & crackers w/bacon, anything quick/easy  D: pizza, chicken breast, pork chop, tacos Drinks: water/coffee, tea throughout day, and margarita sometimes with dinner  Previous extensive discussion on SMALL changes, focusing on goal of something green on the plate 2x per week, using freezer section/microwave steamables for convenience. Patient reports at today's visit, still not doing well with diet. Reinforced importance, brainstormed creative ways to make healthier options more appealing (+adding cheese, adding dipping sauce)  Hypertension:  Uncontrolled; current treatment: lisinopril-hctz 20-12.1m daily, was to increase to lisin-hctz 20-226mbut did not yet receive RX;   Current home readings: SBP 170s, reports checking x2 since our last discussion. Attempted checking during visit but home monitor not working.  Denies hypotensive/hypertensive symptoms  Recommended check BP every 1-2 weeks & document for [place] have data at future visits, Recommend patient to call [employer] to check on status of lisinopril-hctz 20-2513msend us [place]amessage if there are issues. Goal is to begin lisinopril 20-86m20mior to PCP appt, 10/13/20. If patient remains very elevated at PCP visit, may consider taking TWO of the lisinopril 20-12.5mg 1mtal dose = lisinopril 40-86mg 47my) to achieve better control.  Hyperlipidemia:  Uncontrolled; current treatment: atorvastatin 80mg; 51mdications previously tried: crestor (fatigue/myalgia)  Counseled on cholesterol management by lifestyle + taking medication Previously recommended continue current medication, revisit tighter cholesterol control after focus of diet + blood pressure control. In future, consider addition of zetia 10mg.  31mient Goals/Self-Care Activities Over the next 90 days, patient will:  - take medications as prescribed,  - check blood pressure every 1-2 weeks, document, and provide at future  appointments and  - engage in dietary modifications by aiming for greens on the plate 2x per week, getting creative with freezer section/microwave steamers for convenience, add cheese topping or dipping sauce to create more motivation.  Follow Up Plan: Telephone follow up appointment with care management team member scheduled for: 2 months     Medication Assistance: None required.  Patient affirms current coverage meets needs.  [facility] [place] [phone]  Patient agrees to Care  Plan and Follow-up.  Plan: Telephone follow up appointment with care management team member scheduled for:  2 months  Stephanie Perkins

## 2020-09-30 NOTE — Telephone Encounter (Signed)
Pt reports never received new BP med sent to her pharmacy in May.  We need to call her mailorder and find out what is going on and why hasn 't shipped out.

## 2020-10-05 ENCOUNTER — Encounter: Payer: Self-pay | Admitting: Family Medicine

## 2020-10-05 DIAGNOSIS — I10 Essential (primary) hypertension: Secondary | ICD-10-CM

## 2020-10-06 ENCOUNTER — Other Ambulatory Visit: Payer: Self-pay | Admitting: Family Medicine

## 2020-10-06 DIAGNOSIS — F339 Major depressive disorder, recurrent, unspecified: Secondary | ICD-10-CM

## 2020-10-06 MED ORDER — VALSARTAN-HYDROCHLOROTHIAZIDE 320-25 MG PO TABS: 1.0000 | ORAL_TABLET | Freq: Every day | ORAL | 0 refills | Status: DC

## 2020-10-13 ENCOUNTER — Other Ambulatory Visit: Payer: Self-pay

## 2020-10-13 ENCOUNTER — Ambulatory Visit (INDEPENDENT_AMBULATORY_CARE_PROVIDER_SITE_OTHER): Payer: Medicare HMO | Admitting: Family Medicine

## 2020-10-13 ENCOUNTER — Encounter: Payer: Self-pay | Admitting: Family Medicine

## 2020-10-13 VITALS — BP 137/70 | HR 79 | Ht 66.0 in | Wt 228.0 lb

## 2020-10-13 DIAGNOSIS — R7301 Impaired fasting glucose: Secondary | ICD-10-CM

## 2020-10-13 DIAGNOSIS — Z1211 Encounter for screening for malignant neoplasm of colon: Secondary | ICD-10-CM

## 2020-10-13 DIAGNOSIS — E785 Hyperlipidemia, unspecified: Secondary | ICD-10-CM

## 2020-10-13 DIAGNOSIS — R7303 Prediabetes: Secondary | ICD-10-CM

## 2020-10-13 DIAGNOSIS — I10 Essential (primary) hypertension: Secondary | ICD-10-CM

## 2020-10-13 LAB — POCT GLYCOSYLATED HEMOGLOBIN (HGB A1C): Hemoglobin A1C: 6.4 % — AB (ref 4.0–5.6)

## 2020-10-13 MED ORDER — BLOOD GLUCOSE METER KIT: PACK | 0 refills | Status: DC

## 2020-10-13 NOTE — Progress Notes (Signed)
Established Patient Office Visit  Subjective:  Patient ID: Stephanie Perkins, female    DOB: September 10, 1960  Age: 60 y.o. MRN: [id number]  CC:  Chief Complaint  Patient presents with   Hypertension   ifg    HPI Stephanie Perkins presents for she says she recently started the losartan HCTZ and has been doing well on it.  She is just been taking it for a few days now but her home blood pressures have look phenomenal.  She is actually felt good on it though she has noticed some slight increase in urination we had recently sent a new prescription to her mail order pharmacy for valsartan but that was before she had picked up the new prescription which seems to be working great.  Impaired fasting glucose-no increased thirst or urination. No symptoms consistent with hypoglycemia.   Past Medical History:  Diagnosis Date   Depression    Hyperlipidemia    Hypertension    Multiple sclerosis ([place])    Vision abnormalities     Past Surgical History:  Procedure Laterality Date   APPENDECTOMY  1-99   CESAREAN SECTION  9-85   TOTAL VAGINAL HYSTERECTOMY  03/2007   for fibroids, Dr. Radene Knee   TUBAL LIGATION  2002    Family History  Problem Relation Age of Onset   Hyperlipidemia Mother    Hypertension Mother    Alcohol abuse Maternal Grandfather    Cancer Maternal Grandfather     Social History   Socioeconomic History   Marital status: Divorced    Spouse name: Not on file   Number of children: Not on file   Years of education: Not on file   Highest education level: Not on file  Occupational History   Not on file  Tobacco Use   Smoking status: Former    Packs/day: 0.50    Pack years: 0.00    Types: Cigarettes   Smokeless tobacco: Never  Substance and Sexual Activity   Alcohol use: No    Alcohol/week: 0.0 standard drinks   Drug use: No   Sexual activity: Not on file  Other Topics Concern   Not on file  Social History Narrative   Not on file   Social Determinants of Health    Financial Resource Strain: Low Risk    Difficulty of Paying Living Expenses: Not hard at all  Food Insecurity: Not on file  Transportation Needs: Not on file  Physical Activity: Not on file  Stress: Not on file  Social Connections: Not on file  Intimate Partner Violence: Not on file    Outpatient Medications Prior to Visit  Medication Sig Dispense Refill   atorvastatin (LIPITOR) 80 MG tablet TAKE 1 TABLET AT BEDTIME 90 tablet 3   Cholecalciferol (VITAMIN D3 GUMMIES) 25 MCG (1000 UT) CHEW Chew 1 each by mouth daily.     COPAXONE 40 MG/ML SOSY Inject 40 mg into the skin 3 (three) times a week.     cyanocobalamin (,VITAMIN B-12,) 1000 MCG/ML injection INJECT 1 ML (1,000 MCG TOTAL) INTO THE MUSCLE EVERY 21 ( TWENTY-ONE) DAYS. 4 mL 3   escitalopram (LEXAPRO) 10 MG tablet TAKE 1 TABLET (10 MG TOTAL) BY MOUTH DAILY. 90 tablet 1   lisinopril-hydrochlorothiazide (ZESTORETIC) 20-12.5 MG tablet Take 2 tablets by mouth daily. 90 tablet 1   modafinil (PROVIGIL) 200 MG tablet TAKE 1-2 TABLETS BY MOUTH AS NEEDED FOR FAUTIGUE  3   valsartan-hydrochlorothiazide (DIOVAN-HCT) 320-25 MG tablet Take 1 tablet by mouth daily. (Patient  not taking: Reported on 10/13/2020) 90 tablet 0   No facility-administered medications prior to visit.    Allergies  Allergen Reactions   Crestor [Rosuvastatin] Other (See Comments)    FAtigue and myalgia   Januvia [Sitagliptin] Swelling   Metformin And Related Other (See Comments)    Diarrrhea   Ocrevus [Ocrelizumab] Diarrhea    Severed diarrhea   Penicillins     REACTION: hives    ROS Review of Systems    Objective:    Physical Exam  BP 137/70   Pulse 79   Ht 5' 6"  (1.676 m)   Wt 228 lb (103.4 kg)   SpO2 98%   BMI 36.80 kg/m  Wt Readings from Last 3 Encounters:  10/13/20 228 lb (103.4 kg)  04/15/20 222 lb (100.7 kg)  10/15/19 210 lb (95.3 kg)     Health Maintenance Due  Topic Date Due   Pneumococcal Vaccine 60-97 Years old (1 - PCV) Never done    Zoster Vaccines- Shingrix (1 of 2) Never done   COVID-19 Vaccine (3 - Pfizer risk series) 08/19/2019   COLONOSCOPY (Pts 45-25yr Insurance coverage will need to be confirmed)  05/13/2020    There are no preventive care reminders to display for this patient.  Lab Results  Component Value Date   TSH 3.98 01/18/2018   Lab Results  Component Value Date   WBC 8.8 06/02/2015   HGB 13.4 06/02/2015   HCT 40.9 06/02/2015   MCV 83 06/02/2015   PLT 352 06/02/2015   Lab Results  Component Value Date   NA 140 04/15/2020   K 4.3 04/15/2020   CO2 27 04/15/2020   GLUCOSE 98 04/15/2020   BUN 16 04/15/2020   CREATININE 0.64 04/15/2020   BILITOT 0.6 10/21/2019   ALKPHOS 53 10/31/2016   AST 15 10/21/2019   ALT 17 10/21/2019   PROT 6.6 10/21/2019   ALBUMIN 4.4 10/31/2016   CALCIUM 9.7 04/15/2020   Lab Results  Component Value Date   CHOL 220 (H) 10/21/2019   Lab Results  Component Value Date   HDL 47 (L) 10/21/2019   Lab Results  Component Value Date   LDLCALC 146 (H) 10/21/2019   Lab Results  Component Value Date   TRIG 148 10/21/2019   Lab Results  Component Value Date   CHOLHDL 4.7 10/21/2019   Lab Results  Component Value Date   HGBA1C 6.4 (A) 10/13/2020      Assessment & Plan:   Problem List Items Addressed This Visit       Cardiovascular and Mediastinum   HYPERTENSION, BENIGN    Initial blood pressure was elevated but repeat was much improved and was under 140.  It sounds like at home she has been getting really great blood pressures we will continue with current regimen with lisinopril HCTZ 40/25 which she is tolerating fantastically.  We will check potassium and creatinine since we did up her HCTZ component if she is otherwise doing well plan to follow-up in 3 months.       Relevant Medications   lisinopril-hydrochlorothiazide (ZESTORETIC) 20-12.5 MG tablet   Other Relevant Orders   Lipid Panel w/reflex Direct LDL   COMPLETE METABOLIC PANEL WITH GFR      Endocrine   IMPAIRED FASTING GLUCOSE    A1c did go up slightly to 6.4, from previous of 6.2.  She says for the last week she is really gotten back on track with her diet just encouraged her to continue to work on  that and stay as active as she is able to with her MS.  Plan to follow-up in 3 months.  She says she is trying to lose a little bit of weight as well and that should help with any insulin resistance.  We also discussed starting to check blood sugars at home she would like to be able to do that at least periodically.  We will send over new prescription though I did warn that some insurances will not cover it unless they have full-blown diabetes.       Relevant Orders   POCT glycosylated hemoglobin (Hb A1C) (Completed)   Lipid Panel w/reflex Direct LDL   COMPLETE METABOLIC PANEL WITH GFR     Other   Hyperlipidemia   Relevant Medications   lisinopril-hydrochlorothiazide (ZESTORETIC) 20-12.5 MG tablet   Other Relevant Orders   Lipid Panel w/reflex Direct LDL   COMPLETE METABOLIC PANEL WITH GFR   Other Visit Diagnoses     Screen for colon cancer    -  Primary   Relevant Orders   Cologuard   Prediabetes       Relevant Medications   blood glucose meter kit and supplies       She is due for repeat colon cancer screening and had a normal colonoscopy 10 years ago in February 2012.  We discussed options she is more interested in doing Cologuard this time.  Meds ordered this encounter  Medications   blood glucose meter kit and supplies    Sig: Dispense based on patient and insurance preference. Use to check blood sugars once daily. DX: R73.03    Dispense:  1 each    Refill:  0    Order Specific Question:   Number of strips    Answer:   100    Order Specific Question:   Number of lancets    Answer:   100    Follow-up: Return in about 3 months (around 01/13/2021) for Hypertension and prediabetes check .    Beatrice Lecher, MD

## 2020-10-13 NOTE — Progress Notes (Signed)
Pt stated that she has not started the Valsartan-hctz 320-25. She said that she didn't know that her medication had been changed from the lisinopril-hctz 40-25

## 2020-10-13 NOTE — Assessment & Plan Note (Signed)
Initial blood pressure was elevated but repeat was much improved and was under 140.  It sounds like at home she has been getting really great blood pressures we will continue with current regimen with lisinopril HCTZ 40/25 which she is tolerating fantastically.  We will check potassium and creatinine since we did up her HCTZ component if she is otherwise doing well plan to follow-up in 3 months.

## 2020-10-13 NOTE — Assessment & Plan Note (Addendum)
A1c did go up slightly to 6.4, from previous of 6.2.  She says for the last week she is really gotten back on track with her diet just encouraged her to continue to work on that and stay as active as she is able to with her MS.  Plan to follow-up in 3 months.  She says she is trying to lose a little bit of weight as well and that should help with any insulin resistance.  We also discussed starting to check blood sugars at home she would like to be able to do that at least periodically.  We will send over new prescription though I did warn that some insurances will not cover it unless they have full-blown diabetes.

## 2020-10-14 DIAGNOSIS — I10 Essential (primary) hypertension: Secondary | ICD-10-CM | POA: Diagnosis not present

## 2020-10-14 DIAGNOSIS — E785 Hyperlipidemia, unspecified: Secondary | ICD-10-CM | POA: Diagnosis not present

## 2020-10-14 DIAGNOSIS — R7301 Impaired fasting glucose: Secondary | ICD-10-CM | POA: Diagnosis not present

## 2020-10-15 ENCOUNTER — Encounter: Payer: Self-pay | Admitting: Family Medicine

## 2020-10-15 DIAGNOSIS — E785 Hyperlipidemia, unspecified: Secondary | ICD-10-CM

## 2020-10-15 LAB — COMPLETE METABOLIC PANEL WITH GFR
AG Ratio: 1.7 (calc) (ref 1.0–2.5)
ALT: 19 U/L (ref 6–29)
AST: 18 U/L (ref 10–35)
Albumin: 4.5 g/dL (ref 3.6–5.1)
Alkaline phosphatase (APISO): 67 U/L (ref 37–153)
BUN: 13 mg/dL (ref 7–25)
CO2: 26 mmol/L (ref 20–32)
Calcium: 9.8 mg/dL (ref 8.6–10.4)
Chloride: 102 mmol/L (ref 98–110)
Creat: 0.65 mg/dL (ref 0.50–1.05)
Globulin: 2.6 g/dL (calc) (ref 1.9–3.7)
Glucose, Bld: 116 mg/dL — ABNORMAL HIGH (ref 65–99)
Potassium: 4 mmol/L (ref 3.5–5.3)
Sodium: 138 mmol/L (ref 135–146)
Total Bilirubin: 0.7 mg/dL (ref 0.2–1.2)
Total Protein: 7.1 g/dL (ref 6.1–8.1)
eGFR: 101 mL/min/{1.73_m2} (ref >=60)

## 2020-10-15 LAB — LIPID PANEL W/REFLEX DIRECT LDL
Cholesterol: 187 mg/dL (ref <200)
HDL: 37 mg/dL — ABNORMAL LOW (ref >=50)
LDL Cholesterol (Calc): 125 mg/dL (calc) — ABNORMAL HIGH
Non-HDL Cholesterol (Calc): 150 mg/dL (calc) — ABNORMAL HIGH (ref <130)
Total CHOL/HDL Ratio: 5.1 (calc) — ABNORMAL HIGH (ref <5.0)
Triglycerides: 137 mg/dL (ref <150)

## 2020-10-15 MED ORDER — EZETIMIBE 10 MG PO TABS: 10.0000 mg | ORAL_TABLET | Freq: Every day | ORAL | 3 refills | Status: DC

## 2020-10-15 NOTE — Telephone Encounter (Signed)
Meds ordered this encounter  Medications   ezetimibe (ZETIA) 10 MG tablet    Sig: Take 1 tablet (10 mg total) by mouth daily.    Dispense:  90 tablet    Refill:  3

## 2020-10-20 DIAGNOSIS — Z1211 Encounter for screening for malignant neoplasm of colon: Secondary | ICD-10-CM | POA: Diagnosis not present

## 2020-10-27 LAB — COLOGUARD: Cologuard: NEGATIVE

## 2020-11-18 ENCOUNTER — Other Ambulatory Visit: Payer: Self-pay

## 2020-11-18 ENCOUNTER — Ambulatory Visit (INDEPENDENT_AMBULATORY_CARE_PROVIDER_SITE_OTHER): Payer: Medicare HMO | Admitting: Pharmacist

## 2020-11-18 DIAGNOSIS — E785 Hyperlipidemia, unspecified: Secondary | ICD-10-CM | POA: Diagnosis not present

## 2020-11-18 DIAGNOSIS — I10 Essential (primary) hypertension: Secondary | ICD-10-CM

## 2020-11-18 DIAGNOSIS — R7303 Prediabetes: Secondary | ICD-10-CM

## 2020-11-18 NOTE — Progress Notes (Signed)
Chronic Care Management Pharmacy Note  11/18/2020 Name:  Stephanie Perkins MRN:  [id number] DOB:  1960-05-27  Summary: addressed preDM, HTN, HLD. Patient experienced extreme fatigue with zetia, stopped taking.  Recommendations/Changes made from today's visit: continue current regimen, watching diet & exercise closely to optimize glucose and cholesterol control.   Plan: f/u with pharmacist in 3 months  Subjective: Stephanie Perkins is an 60 y.o. year old female who is a primary patient of Metheney, Rene Kocher, MD.  The CCM team was consulted for assistance with disease management and care coordination needs.    Engaged with patient by telephone for follow up visit in response to provider referral for pharmacy case management and/or care coordination services.   Consent to Services:  The patient was given information about Chronic Care Management services, agreed to services, and gave verbal consent prior to initiation of services.  Please see initial visit note for detailed documentation.   Patient Care Team: Hali Marry, MD as PCP - Andria Meuse, MD as Consulting Physician (Obstetrics and Gynecology) Ellwood Dense., MD as Referring Physician (Neurology) Darius Bump, [facility] as Pharmacist (Pharmacist)   Objective:  Lab Results  Component Value Date   CREATININE 0.65 10/14/2020   CREATININE 0.64 04/15/2020   CREATININE 0.60 10/21/2019    Lab Results  Component Value Date   HGBA1C 6.4 (A) 10/13/2020       Component Value Date/Time   CHOL 187 10/14/2020 0929   TRIG 137 10/14/2020 0929   HDL 37 (L) 10/14/2020 0929   CHOLHDL 5.1 (H) 10/14/2020 0929   VLDL 38 (H) 12/22/2015 1101   LDLCALC 125 (H) 10/14/2020 0929    Hepatic Function Latest Ref Rng & Units 10/14/2020 10/21/2019 10/10/2018  Total Protein 6.1 - 8.1 g/dL 7.1 6.6 7.1  Albumin 3.6 - 5.1 g/dL - - -  AST 10 - 35 U/L _0 ALT 6 - 29 U/L _1 Alk Phosphatase 33 - 130 U/L - - -  Total  Bilirubin 0.2 - 1.2 mg/dL 0.7 0.6 0.6  Bilirubin, Direct 0.00 - 0.40 mg/dL - - -    Lab Results  Component Value Date/Time   TSH 3.98 01/18/2018 09:17 AM   TSH 2.57 10/31/2016 07:38 AM    CBC Latest Ref Rng & Units 06/02/2015 02/11/2013 11/15/2012  WBC 3.4 - 10.8 x10E3/uL 8.8 6.7 9.7  Hemoglobin 11.1 - 15.9 g/dL 13.4 14.4 13.9  Hematocrit 34.0 - 46.6 % 40.9 42.6 41.1  Platelets 150 - 379 x10E3/uL 352 299 258    Clinical ASCVD: No  The 10-year ASCVD risk score Mikey Bussing DC Jr., et al., 2013) is: 12.9%   Values used to calculate the score:     Age: 73 years     Sex: Female     Is Non-Hispanic African American: No     Diabetic: No     Tobacco smoker: Yes     Systolic Blood Pressure: 332 mmHg     Is BP treated: Yes     HDL Cholesterol: 37 mg/dL     Total Cholesterol: 187 mg/dL     Social History   Tobacco Use  Smoking Status Former   Packs/day: 0.50   Types: Cigarettes  Smokeless Tobacco Never   BP Readings from Last 3 Encounters:  10/13/20 137/70  04/15/20 (!) 163/62  10/15/19 131/67   Pulse Readings from Last 3 Encounters:  10/13/20 79  04/15/20 79  10/15/19 61   Wt Readings  from Last 3 Encounters:  10/13/20 228 lb (103.4 kg)  04/15/20 222 lb (100.7 kg)  10/15/19 210 lb (95.3 kg)    Assessment: Review of patient past medical history, allergies, medications, health status, including review of consultants reports, laboratory and other test data, was performed as part of comprehensive evaluation and provision of chronic care management services.   SDOH:  (Social Determinants of Health) assessments and interventions performed:    CCM Care Plan  Allergies  Allergen Reactions   Crestor [Rosuvastatin] Other (See Comments)    FAtigue and myalgia   Januvia [Sitagliptin] Swelling   Metformin And Related Other (See Comments)    Diarrrhea   Ocrevus [Ocrelizumab] Diarrhea    Severed diarrhea   Penicillins     REACTION: hives    Medications Reviewed Today      Reviewed by Darius Bump, RPH (Pharmacist) on 11/18/20 at 40  Med List Status: <None>   Medication Order Taking? Sig Documenting Provider Last Dose Status Informant  atorvastatin (LIPITOR) 80 MG tablet [id number] Yes TAKE 1 TABLET AT BEDTIME Hali Marry, MD Taking Active   blood glucose meter kit and supplies [id number] Yes Dispense based on patient and insurance preference. Use to check blood sugars once daily. DX: R73.03 Hali Marry, MD Taking Active   Cholecalciferol (VITAMIN D3 GUMMIES) 25 MCG (1000 UT) CHEW [id number] Yes Chew 1 each by mouth daily. [provider] Taking Active   COPAXONE 40 MG/ML SOSY [id number] Yes Inject 40 mg into the skin 3 (three) times a week. [provider] Taking Active            Med Note Dorene Ar Aug 25, 2020 11:16 AM) 68m SQ injection 3x per week, MWF per patient & neurology  cyanocobalamin (,VITAMIN B-12,) 1000 MCG/ML injection [id number] INJECT 1 ML (1,000 MCG TOTAL) INTO THE MUSCLE EVERY 21 ( TWENTY-ONE) DAYS. MHali Marry MD Taking Active   escitalopram (LEXAPRO) 10 MG tablet [id number] TAKE 1 TABLET (10 MG TOTAL) BY MOUTH DAILY. MHali Marry MD Taking Active   ezetimibe (ZETIA) 10 MG tablet [id number] Take 1 tablet (10 mg total) by mouth daily.  Patient not taking: Reported on 11/18/2020   MHali Marry MD Not Taking Active   lisinopril-hydrochlorothiazide (ZESTORETIC) 20-25 MG tablet [id number] Take 1 tablet by mouth daily. 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   Claustrophobia 06/19/2017   High  risk medication use 06/02/2015   Excessive daytime sleepiness 06/02/2015   Snoring 06/02/2015   Gait disturbance 06/02/2015   Ataxia 06/02/2015   Other fatigue 06/02/2015   Disturbed cognition 06/02/2015   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   Encounter for therapeutic drug level monitoring 05/17/2015   Fatigue 05/17/2015   Encounter for monitoring immunomodulating therapy 05/17/2015   Lactose intolerance 12/01/2011   ANEMIA, PERNICIOUS 12/23/2009   IMPAIRED FASTING GLUCOSE 05/21/2009   MULTIPLE SCLEROSIS 11/22/2007   Hyperlipidemia 09/25/2007   Depression, recurrent ([place] 09/25/2007   HYPERTENSION, BENIGN  09/25/2007    Immunization History  Administered Date(s) Administered   Influenza,inj,Quad PF,6+ Mos 03/13/2018, 01/13/2019, 04/15/2020   Influenza-Unspecified 01/21/2013   PFIZER(Purple Top)SARS-COV-2 Vaccination 06/27/2019, 07/22/2019    Conditions to be addressed/monitored: HTN, HLD, and preDM  Care Plan : Medication Management  Updates made by Darius Bump, [place] since 11/18/2020 12:00 AM     Problem: HTN, HLD, pre-DM      Long-Range Goal: Disease Progression Prevention   Start Date: 08/23/2020  Recent Progress: On track  Priority: High  Note:   Current Barriers:  Suboptimal therapeutic regimen for HTN, HLD  Pharmacist Clinical Goal(s):  Over the next 90 days, patient will achieve control of BP as evidenced by dose optimization and home BP resadings through collaboration with PharmD and provider.   Interventions: 1:1 collaboration with Hali Marry, MD regarding development and update of comprehensive plan of care as evidenced by provider attestation and co-signature Inter-disciplinary care team collaboration (see longitudinal plan of care) Comprehensive medication review performed; medication list updated in electronic medical record  Pre-Diabetes:   Uncontrolled; current treatment:lifestyle modifications, has been incorporating more veggies in her diet  after our last call!   Previously discussed current meal patterns:  B: atkins bars + coffee L: leftover pizza, soft taco (+beans, cheese), cheese & crackers w/bacon, anything quick/easy  D: pizza, chicken breast, pork chop, tacos Drinks: water/coffee, tea throughout day, and margarita sometimes with dinner  Previous extensive discussion on SMALL changes, focusing on goal of something green on the plate 2x per week, using freezer section/microwave steamables for convenience.   Exercise: limited by MS fatigue.   Recommend to continue current regimen by working on diet & small, attainable exercises. Also recommend following a1c closely and advised patient we will support her through the journey if/when medication is needed.  Hypertension:  Controlled; current treatment: lisinopril-hctz 20-7m daily,   Current home readings: last reading 120/72, checking weekly and states it is within this range  Denies hypotensive/hypertensive symptoms  Recommended continue to check BP every 1-2 weeks, continue current medication.   Hyperlipidemia:  Uncontrolled; current treatment: atorvastatin 860m unable to tolerate new ezetimibe due to extreme fatigue, patient stopped taking it after 3 weeks of trying & stated felt better after she stopped it.   Medications previously tried: crestor (fatigue/myalgia)  Counseled on cholesterol management by lifestyle + taking medication Recommend continue current regimen, encouraged tighter control with diet choices if patient is unable to utilize medication, patient expressed understanding.   Patient Goals/Self-Care Activities Over the next 90 days, patient will:  - take medications as prescribed,  - check blood pressure every 1-2 weeks, document, and provide at future appointments and  - engage in dietary modifications by aiming for greens on the plate 2x per week, and incorporating small attainable exercises such as leg lifts & arm circles during commercial breaks  while watching tv.  Follow Up Plan: Telephone follow up appointment with care management team member scheduled for: 3 months     Medication Assistance: None required.  Patient affirms current coverage meets needs.  [facility] [address] [id number]: [phone]: [phone] Follow Up:  Patient agrees to Care Plan and Follow-up.  Plan: Telephone follow up appointment with care management team member scheduled for:  3 months  KeDarius Bump

## 2020-11-18 NOTE — Patient Instructions (Signed)
Visit Information  PATIENT GOALS:  Goals Addressed             This Visit's Progress    Medication Management       Patient Goals/Self-Care Activities Over the next 90 days, patient will:  - take medications as prescribed,  - check blood pressure every 1-2 weeks, document, and provide at future appointments and  - engage in dietary modifications by aiming for greens on the plate 2x per week, and incorporating small attainable exercises such as leg lifts & arm circles during commercial breaks while watching tv.  Follow Up Plan: Telephone follow up appointment with care management team member scheduled for: 3 months        Patient verbalizes understanding of instructions provided today and agrees to view in MyChart.   Telephone follow up appointment with care management team member scheduled for:  3 months  Gabriel Carina

## 2020-11-24 ENCOUNTER — Other Ambulatory Visit: Payer: Self-pay | Admitting: Family Medicine

## 2020-12-07 ENCOUNTER — Telehealth: Payer: Self-pay | Admitting: Family Medicine

## 2020-12-07 NOTE — Telephone Encounter (Signed)
Left message for patient to call back and schedule Medicare Annual Wellness Visit (AWV) either virtually or in office.  awvi 11/02/18 per [place]   please schedule at anytime with health coach

## 2021-01-03 ENCOUNTER — Other Ambulatory Visit: Payer: Self-pay

## 2021-01-03 ENCOUNTER — Ambulatory Visit (INDEPENDENT_AMBULATORY_CARE_PROVIDER_SITE_OTHER): Payer: Medicare HMO | Admitting: Family Medicine

## 2021-01-03 DIAGNOSIS — Z0000 Encounter for general adult medical examination without abnormal findings: Secondary | ICD-10-CM

## 2021-01-03 NOTE — Patient Instructions (Addendum)
MEDICARE ANNUAL WELLNESS VISIT Health Maintenance Summary and Written Plan of Care  Stephanie Perkins ,  Thank you for allowing me to perform your Medicare Annual Wellness Visit and for your ongoing commitment to your health.   Health Maintenance & Immunization History Health Maintenance  Topic Date Due  . COVID-19 Vaccine (3 - Pfizer risk series) 01/19/2021 (Originally 08/19/2019)  . Zoster Vaccines- Shingrix (1 of 2) 04/05/2021 (Originally 04/03/1979)  . INFLUENZA VACCINE  07/01/2021 (Originally 11/01/2020)  . TETANUS/TDAP  01/29/2021  . MAMMOGRAM  05/12/2022  . Fecal DNA (Cologuard)  10/21/2023  . Hepatitis C Screening  Completed  . HIV Screening  Completed  . HPV VACCINES  Aged Out   Immunization History  Administered Date(s) Administered  . Influenza,inj,Quad PF,6+ Mos 03/13/2018, 01/13/2019, 04/15/2020  . Influenza-Unspecified 01/21/2013  . PFIZER(Purple Top)SARS-COV-2 Vaccination 06/27/2019, 07/22/2019    These are the patient goals that we discussed:  Goals Addressed              This Visit's Progress   .  Patient Stated (pt-stated)        01/03/2021 AWV Goal: Exercise for General Health  Patient will verbalize understanding of the benefits of increased physical activity: Exercising regularly is important. It will improve your overall fitness, flexibility, and endurance. Regular exercise also will improve your overall health. It can help you control your weight, reduce stress, and improve your bone density. Over the next year, patient will increase physical activity as tolerated with a goal of at least 150 minutes of moderate physical activity per week.  You can tell that you are exercising at a moderate intensity if your heart starts beating faster and you start breathing faster but can still hold a conversation. Moderate-intensity exercise ideas include: Walking 1 mile (1.6 km) in about 15 minutes Biking Hiking Golfing Dancing Water aerobics Patient will verbalize  understanding of everyday activities that increase physical activity by providing examples like the following: Yard work, such as: Insurance underwriter Gardening Washing windows or floors Patient will be able to explain general safety guidelines for exercising:  Before you start a new exercise program, talk with your health care provider. Do not exercise so much that you hurt yourself, feel dizzy, or get very short of breath. Wear comfortable clothes and wear shoes with good support. Drink plenty of water while you exercise to prevent dehydration or heat stroke. Work out until your breathing and your heartbeat get faster.         This is a list of Health Maintenance Items that are overdue or due now: Influenza vaccine Td vaccine Shingrix vaccine  Orders/Referrals Placed Today: No orders of the defined types were placed in this encounter.  (Contact our referral department at [phone] if you have not spoken with someone about your referral appointment within the next 5 days)    Follow-up Plan Follow-up with Agapito Games, MD as planned Schedule your Tetanus and Shingrix vaccine at the pharmacy after your have discussed it with your Neurologist. Medicare wellness visit in one year. Patient will access AVS on mychart.      Health Maintenance, Female Adopting a healthy lifestyle and getting preventive care are important in promoting health and wellness. Ask your health care provider about: The right schedule for you to have regular tests and exams. Things you can do on your own to prevent diseases and keep yourself healthy. What should I know about  diet, weight, and exercise? Eat a healthy diet  Eat a diet that includes plenty of vegetables, fruits, low-fat dairy products, and lean protein. Do not eat a lot of foods that are high in solid fats, added sugars, or sodium. Maintain a  healthy weight Body mass index (BMI) is used to identify weight problems. It estimates body fat based on height and weight. Your health care provider can help determine your BMI and help you achieve or maintain a healthy weight. Get regular exercise Get regular exercise. This is one of the most important things you can do for your health. Most adults should: Exercise for at least 150 minutes each week. The exercise should increase your heart rate and make you sweat (moderate-intensity exercise). Do strengthening exercises at least twice a week. This is in addition to the moderate-intensity exercise. Spend less time sitting. Even light physical activity can be beneficial. Watch cholesterol and blood lipids Have your blood tested for lipids and cholesterol at 61 years of age, then have this test every 5 years. Have your cholesterol levels checked more often if: Your lipid or cholesterol levels are high. You are older than 61 years of age. You are at high risk for heart disease. What should I know about cancer screening? Depending on your health history and family history, you may need to have cancer screening at various ages. This may include screening for: Breast cancer. Cervical cancer. Colorectal cancer. Skin cancer. Lung cancer. What should I know about heart disease, diabetes, and high blood pressure? Blood pressure and heart disease High blood pressure causes heart disease and increases the risk of stroke. This is more likely to develop in people who have high blood pressure readings, are of African descent, or are overweight. Have your blood pressure checked: Every 3-5 years if you are 77-29 years of age. Every year if you are 69 years old or older. Diabetes Have regular diabetes screenings. This checks your fasting blood sugar level. Have the screening done: Once every three years after age 59 if you are at a normal weight and have a low risk for diabetes. More often and at a  younger age if you are overweight or have a high risk for diabetes. What should I know about preventing infection? Hepatitis B If you have a higher risk for hepatitis B, you should be screened for this virus. Talk with your health care provider to find out if you are at risk for hepatitis B infection. Hepatitis C Testing is recommended for: Everyone born from [birth year] through 1966. Anyone with known risk factors for hepatitis C. Sexually transmitted infections (STIs) Get screened for STIs, including gonorrhea and chlamydia, if: You are sexually active and are younger than 60 years of age. You are older than 61 years of age and your health care provider tells you that you are at risk for this type of infection. Your sexual activity has changed since you were last screened, and you are at increased risk for chlamydia or gonorrhea. Ask your health care provider if you are at risk. Ask your health care provider about whether you are at high risk for HIV. Your health care provider may recommend a prescription medicine to help prevent HIV infection. If you choose to take medicine to prevent HIV, you should first get tested for HIV. You should then be tested every 3 months for as long as you are taking the medicine. Pregnancy If you are about to stop having your period (premenopausal) and you may become  pregnant, seek counseling before you get pregnant. Take 400 to 800 micrograms (mcg) of folic acid every day if you become pregnant. Ask for birth control (contraception) if you want to prevent pregnancy. Osteoporosis and menopause Osteoporosis is a disease in which the bones lose minerals and strength with aging. This can result in bone fractures. If you are 56 years old or older, or if you are at risk for osteoporosis and fractures, ask your health care provider if you should: Be screened for bone loss. Take a calcium or vitamin D supplement to lower your risk of fractures. Be given hormone replacement  therapy (HRT) to treat symptoms of menopause. Follow these instructions at home: Lifestyle Do not use any products that contain nicotine or tobacco, such as cigarettes, e-cigarettes, and chewing tobacco. If you need help quitting, ask your health care provider. Do not use street drugs. Do not share needles. Ask your health care provider for help if you need support or information about quitting drugs. Alcohol use Do not drink alcohol if: Your health care provider tells you not to drink. You are pregnant, may be pregnant, or are planning to become pregnant. If you drink alcohol: Limit how much you use to 0-1 drink a day. Limit intake if you are breastfeeding. Be aware of how much alcohol is in your drink. In the [place] one drink equals one 12 oz bottle of beer (355 mL), one 5 oz glass of wine (148 mL), or one 1 oz glass of hard liquor (44 mL). General instructions Schedule regular health, dental, and eye exams. Stay current with your vaccines. Tell your health care provider if: You often feel depressed. You have ever been abused or do not feel safe at home. Summary Adopting a healthy lifestyle and getting preventive care are important in promoting health and wellness. Follow your health care provider's instructions about healthy diet, exercising, and getting tested or screened for diseases. Follow your health care provider's instructions on monitoring your cholesterol and blood pressure. This information is not intended to replace advice given to you by your health care provider. Make sure you discuss any questions you have with your health care provider. Document Revised: 05/28/2020 Document Reviewed: 03/13/2018 Elsevier Patient Education  2022 [employer].

## 2021-01-03 NOTE — Progress Notes (Signed)
MEDICARE ANNUAL WELLNESS VISIT  01/03/2021  Telephone Visit Disclaimer This Medicare AWV was conducted by telephone due to national recommendations for restrictions regarding the COVID-19 Pandemic (e.g. social distancing).  I verified, using two identifiers, that I am speaking with Stephanie Perkins or their authorized healthcare agent. I discussed the limitations, risks, security, and privacy concerns of performing an evaluation and management service by telephone and the potential availability of an in-person appointment in the future. The patient expressed understanding and agreed to proceed.  Location of Patient: Home Location of Provider (nurse):  In the office.  Subjective:    Stephanie Perkins is a 60 y.o. female patient of Metheney, Rene Kocher, MD who had a Medicare Annual Wellness Visit today via telephone. Stephanie Perkins is Legally disabled and lives alone. she has 1 child. she reports that she is socially active and does interact with friends/family regularly. she is minimally physically active and enjoys taking care of her cats and dogs.  Patient Care Team: Hali Marry, MD as PCP - General Arvella Nigh, MD as Consulting Physician (Obstetrics and Gynecology) Ellwood Dense., MD as Referring Physician (Neurology) Darius Bump, [facility] as Pharmacist (Pharmacist)  Advanced Directives 01/03/2021  Does Patient Have a Medical Advance Directive? No  Would patient like information on creating a medical advance directive? No - Patient declined    Hospital Utilization Over the Past 12 Months: # of hospitalizations or ER visits: 0 # of surgeries: 0  Review of Systems    Patient reports that her overall health is unchanged compared to last year.  History obtained from chart review and the patient  Patient Reported Readings (BP, Pulse, CBG, Weight, etc) none  Pain Assessment Pain : No/denies pain     Current Medications & Allergies (verified) Allergies as of 01/03/2021        Reactions   Crestor [rosuvastatin] Other (See Comments)   FAtigue and myalgia   Januvia [sitagliptin] Swelling   Metformin And Related Other (See Comments)   Diarrrhea   Ocrevus [ocrelizumab] Diarrhea   Severed diarrhea   Penicillins    REACTION: hives   Zetia [ezetimibe] Other (See Comments)   Extreme fatigue        Medication List        Accurate as of January 03, 2021 10:43 AM. If you have any questions, ask your nurse or doctor.          atorvastatin 80 MG tablet Commonly known as: LIPITOR TAKE 1 TABLET AT BEDTIME   blood glucose meter kit and supplies Dispense based on patient and insurance preference. Use to check blood sugars once daily. DX: R73.03   Copaxone 40 MG/ML Sosy Generic drug: Glatiramer Acetate Inject 40 mg into the skin 3 (three) times a week.   cyanocobalamin 1000 MCG/ML injection Commonly known as: (VITAMIN B-12) INJECT 1 ML (1,000 MCG TOTAL) INTO THE MUSCLE EVERY 21 ( TWENTY-ONE) DAYS.   escitalopram 10 MG tablet Commonly known as: LEXAPRO TAKE 1 TABLET (10 MG TOTAL) BY MOUTH DAILY.   ezetimibe 10 MG tablet Commonly known as: Zetia Take 1 tablet (10 mg total) by mouth daily.   lisinopril-hydrochlorothiazide 20-25 MG tablet Commonly known as: ZESTORETIC Take 1 tablet by mouth daily. Taking the 20-30m   modafinil 200 MG tablet Commonly known as: PROVIGIL TAKE 1-2 TABLETS BY MOUTH AS NEEDED FOR FAUTIGUE   True Metrix Blood Glucose Test test strip Generic drug: glucose blood   TRUEplus Lancets 33G Misc   Vitamin D3  Gummies 25 MCG (1000 UT) Chew Generic drug: Cholecalciferol Chew 1 each by mouth daily.        History (reviewed): Past Medical History:  Diagnosis Date   Depression    Hyperlipidemia    Hypertension    Multiple sclerosis ([place])    Vision abnormalities    Past Surgical History:  Procedure Laterality Date   APPENDECTOMY  1-99   CESAREAN SECTION  9-85   TOTAL VAGINAL HYSTERECTOMY  03/2007   for fibroids, Dr.  Radene Knee   TUBAL LIGATION  2002   Family History  Problem Relation Age of Onset   Hyperlipidemia Mother    Hypertension Mother    Alcohol abuse Maternal Grandfather    Cancer Maternal Grandfather    Social History   Socioeconomic History   Marital status: Divorced    Spouse name: Not on file   Number of children: Not on file   Years of education: 16   Highest education level: Bachelor's degree (e.g., BA, AB, BS)  Occupational History   Occupation: Disabled  Tobacco Use   Smoking status: Former    Packs/day: 1.00    Types: Cigarettes   Smokeless tobacco: Never   Tobacco comments:    Previous smoker; Started at the age of 59 then quit for several years. Started back again in 2018. Smokes pack a day.Patient did not want any cessation materials at this time.  Substance and Sexual Activity   Alcohol use: Yes    Alcohol/week: 7.0 standard drinks    Types: 7 Standard drinks or equivalent per week   Drug use: No   Sexual activity: Not on file  Other Topics Concern   Not on file  Social History Narrative   Lives alone. She has one son. She enjoys taking care of her cats and dogs. She does like to do yard work sometimes.   Social Determinants of Health   Financial Resource Strain: Low Risk    Difficulty of Paying Living Expenses: Not hard at all  Food Insecurity: No Food Insecurity   Worried About Charity fundraiser in the Last Year: Never true   [place] in the Last Year: Never true  Transportation Needs: No Transportation Needs   Lack of Transportation (Medical): No   Lack of Transportation (Non-Medical): No  Physical Activity: Inactive   Days of Exercise per Week: 0 days   Minutes of Exercise per Session: 0 min  Stress: No Stress Concern Present   Feeling of Stress : Not at all  Social Connections: Socially Isolated   Frequency of Communication with Friends and Family: Once a week   Frequency of Social Gatherings with Friends and Family: Once a week   Attends  Religious Services: Never   Marine scientist or Organizations: No   Attends Archivist Meetings: Never   Marital Status: Divorced    Activities of Daily Living In your present state of health, do you have any difficulty performing the following activities: 01/03/2021  Hearing? N  Vision? N  Difficulty concentrating or making decisions? Y  Comment Diffulty concentrating due to M.S.  Walking or climbing stairs? Y  Comment climbing stairs are difficult.  Dressing or bathing? N  Doing errands, shopping? N  Preparing Food and eating ? N  Using the Toilet? N  In the past six months, have you accidently leaked urine? Y  Comment multiple accidents.  Do you have problems with loss of bowel control? Y  Comment multiple accidents.  Managing your Medications? N  Managing your Finances? N  Housekeeping or managing your Housekeeping? N  Some recent data might be hidden    Patient Education/ Literacy How often do you need to have someone help you when you read instructions, pamphlets, or other written materials from your doctor or pharmacy?: 1 - Never What is the last grade level you completed in school?: Bachelor's Degree  Exercise Current Exercise Habits: The patient does not participate in regular exercise at present, Exercise limited by: neurologic condition(s)  Diet Patient reports consuming 3 meals a day and 1-2 snack(s) a day Patient reports that her primary diet is: Regular Patient reports that she does have regular access to food.   Depression Screen PHQ 2/9 Scores 01/03/2021 10/13/2020 04/15/2020 10/10/2018 08/30/2017 06/07/2017 05/10/2017  PHQ - 2 Score 2 2 2 2 1 2 6   PHQ- 9 Score 10 12 13 11 6 6 20      Fall Risk Fall Risk  01/03/2021 10/13/2020 10/15/2019 10/10/2018  Falls in the past year? 0 0 1 0  Number falls in past yr: 0 0 1 0  Injury with Fall? 0 0 0 0  Risk for fall due to : No Fall Risks No Fall Risks - -  Follow up Falls evaluation completed;Education  provided;Falls prevention discussed Falls prevention discussed;Falls evaluation completed - -     Objective:  Stephanie Perkins seemed alert and oriented and she participated appropriately during our telephone visit.  Blood Pressure Weight BMI  BP Readings from Last 3 Encounters:  10/13/20 137/70  04/15/20 (!) 163/62  10/15/19 131/67   Wt Readings from Last 3 Encounters:  10/13/20 228 lb (103.4 kg)  04/15/20 222 lb (100.7 kg)  10/15/19 210 lb (95.3 kg)   BMI Readings from Last 1 Encounters:  10/13/20 36.80 kg/m    *Unable to obtain current vital signs, weight, and BMI due to telephone visit type  Hearing/Vision  Sahaana did not seem to have difficulty with hearing/understanding during the telephone conversation Reports that she has not had a formal eye exam by an eye care professional within the past year Reports that she has not had a formal hearing evaluation within the past year *Unable to fully assess hearing and vision during telephone visit type  Cognitive Function: 6CIT Screen 01/03/2021  What Year? 0 points  What month? 0 points  What time? 0 points  Count back from 20 0 points  Months in reverse 0 points  Repeat phrase 0 points  Total Score 0   (Normal:0-7, Significant for Dysfunction: >8)  Normal Cognitive Function Screening: Yes   Immunization & Health Maintenance Record Immunization History  Administered Date(s) Administered   Influenza,inj,Quad PF,6+ Mos 03/13/2018, 01/13/2019, 04/15/2020   Influenza-Unspecified 01/21/2013   PFIZER(Purple Top)SARS-COV-2 Vaccination 06/27/2019, 07/22/2019    Health Maintenance  Topic Date Due   COVID-19 Vaccine (3 - Pfizer risk series) 01/19/2021 (Originally 08/19/2019)   Zoster Vaccines- Shingrix (1 of 2) 04/05/2021 (Originally 04/03/1979)   INFLUENZA VACCINE  07/01/2021 (Originally 11/01/2020)   TETANUS/TDAP  01/29/2021   MAMMOGRAM  05/12/2022   Fecal DNA (Cologuard)  10/21/2023   Hepatitis C Screening  Completed   HIV  Screening  Completed   HPV VACCINES  Aged Out       Assessment  This is a routine wellness examination for Stephanie Perkins.  Health Maintenance: Due or Overdue There are no preventive care reminders to display for this patient.   Stephanie Perkins does not need a referral for  Community Assistance: Care Management:   no Social Work:    no Prescription Assistance:  no Nutrition/Diabetes Education:  no   Plan:  Personalized Goals  Goals Addressed               This Visit's Progress     Patient Stated (pt-stated)        01/03/2021 AWV Goal: Exercise for General Health  Patient will verbalize understanding of the benefits of increased physical activity: Exercising regularly is important. It will improve your overall fitness, flexibility, and endurance. Regular exercise also will improve your overall health. It can help you control your weight, reduce stress, and improve your bone density. Over the next year, patient will increase physical activity as tolerated with a goal of at least 150 minutes of moderate physical activity per week.  You can tell that you are exercising at a moderate intensity if your heart starts beating faster and you start breathing faster but can still hold a conversation. Moderate-intensity exercise ideas include: Walking 1 mile (1.6 km) in about 15 minutes Biking Hiking Golfing Dancing Water aerobics Patient will verbalize understanding of everyday activities that increase physical activity by providing examples like the following: Yard work, such as: Sales promotion account executive Gardening Washing windows or floors Patient will be able to explain general safety guidelines for exercising:  Before you start a new exercise program, talk with your health care provider. Do not exercise so much that you hurt yourself, feel dizzy, or get very short of breath. Wear comfortable clothes  and wear shoes with good support. Drink plenty of water while you exercise to prevent dehydration or heat stroke. Work out until your breathing and your heartbeat get faster.        Personalized Health Maintenance & Screening Recommendations  Influenza vaccine Td vaccine Shingrix vaccine  Lung Cancer Screening Recommended: yes; patient declined at this time. (Low Dose CT Chest recommended if Age 73-80 years, 30 pack-year currently smoking OR have quit w/in past 15 years) Hepatitis C Screening recommended: no HIV Screening recommended: no  Advanced Directives: Written information was not prepared per patient's request.  Referrals & Orders No orders of the defined types were placed in this encounter.   Follow-up Plan Follow-up with Hali Marry, MD as planned Schedule your Tetanus and Shingrix vaccine at the pharmacy after your have discussed it with your Neurologist. Medicare wellness visit in one year. Patient will access AVS on mychart.   I have personally reviewed and noted the following in the patient's chart:   Medical and social history Use of alcohol, tobacco or illicit drugs  Current medications and supplements Functional ability and status Nutritional status Physical activity Advanced directives List of other physicians Hospitalizations, surgeries, and ER visits in previous 12 months Vitals Screenings to include cognitive, depression, and falls Referrals and appointments  In addition, I have reviewed and discussed with Stephanie Perkins certain preventive protocols, quality metrics, and best practice recommendations. A written personalized care plan for preventive services as well as general preventive health recommendations is available and can be mailed to the patient at her request.      Tinnie Gens, RN  01/03/2021

## 2021-01-13 ENCOUNTER — Encounter: Payer: Self-pay | Admitting: Family Medicine

## 2021-01-13 ENCOUNTER — Ambulatory Visit (INDEPENDENT_AMBULATORY_CARE_PROVIDER_SITE_OTHER): Payer: Medicare HMO | Admitting: Family Medicine

## 2021-01-13 VITALS — BP 164/92 | HR 77 | Ht 66.0 in | Wt 227.0 lb

## 2021-01-13 DIAGNOSIS — R7303 Prediabetes: Secondary | ICD-10-CM

## 2021-01-13 DIAGNOSIS — R7301 Impaired fasting glucose: Secondary | ICD-10-CM | POA: Diagnosis not present

## 2021-01-13 DIAGNOSIS — Z23 Encounter for immunization: Secondary | ICD-10-CM

## 2021-01-13 DIAGNOSIS — R5383 Other fatigue: Secondary | ICD-10-CM

## 2021-01-13 DIAGNOSIS — I10 Essential (primary) hypertension: Secondary | ICD-10-CM | POA: Diagnosis not present

## 2021-01-13 DIAGNOSIS — R270 Ataxia, unspecified: Secondary | ICD-10-CM

## 2021-01-13 DIAGNOSIS — F339 Major depressive disorder, recurrent, unspecified: Secondary | ICD-10-CM

## 2021-01-13 LAB — POCT GLYCOSYLATED HEMOGLOBIN (HGB A1C): Hemoglobin A1C: 6.2 % — AB (ref 4.0–5.6)

## 2021-01-13 MED ORDER — VALSARTAN-HYDROCHLOROTHIAZIDE 320-12.5 MG PO TABS: 1.0000 | ORAL_TABLET | Freq: Every day | ORAL | 0 refills | Status: DC

## 2021-01-13 NOTE — Assessment & Plan Note (Signed)
Severe fatigue secondary to her MS.  Could consider stimulant therapy to improve energy levels.  But we cannot consider that until her blood pressure is better controlled

## 2021-01-13 NOTE — Assessment & Plan Note (Signed)
She is doing a great job.  A1c was 6.4 last time now it is down to 6.2 she is tried making some changes and has been tracking her blood sugars.

## 2021-01-13 NOTE — Progress Notes (Signed)
Established Patient Office Visit  Subjective:  Patient ID: Stephanie Perkins, female    DOB: 1960-11-24  Age: 60 y.o. MRN: [id number]  CC:  Chief Complaint  Patient presents with   Hypertension   Prediabetes     HPI Stephanie Perkins presents for   Hypertension- Pt denies chest pain, SOB, dizziness, or heart palpitations.  Taking meds as directed w/o problems.  Denies medication side effects.  We did increase the HCTZ component of her blood pressure pill when I saw her last unfortunately it caused significant increase in urinary frequency and it did not improve over time so she actually stopped the medication and went back to her old dose of lisinopril HCT 20/12.5.  Impaired fasting glucose-no increased thirst or urination. No symptoms consistent with hypoglycemia.  In regards to her MS she is just continuing to struggle with significant fatigue.  Also going through recent separation and divorce has been really difficult as well she says she just feels really lonely at times.  She does not really have a good support system in place and when she tries to talk to her son about it who is an adult he does not really want to hear about it.   Past Medical History:  Diagnosis Date   Depression    Hyperlipidemia    Hypertension    Multiple sclerosis ([place])    Vision abnormalities     Past Surgical History:  Procedure Laterality Date   APPENDECTOMY  1-99   CESAREAN SECTION  9-85   TOTAL VAGINAL HYSTERECTOMY  03/2007   for fibroids, Dr. Radene Knee   TUBAL LIGATION  2002    Family History  Problem Relation Age of Onset   Hyperlipidemia Mother    Hypertension Mother    Alcohol abuse Maternal Grandfather    Cancer Maternal Grandfather     Social History   Socioeconomic History   Marital status: Divorced    Spouse name: Not on file   Number of children: Not on file   Years of education: 16   Highest education level: Bachelor's degree (e.g., BA, AB, BS)  Occupational History    Occupation: Disabled  Tobacco Use   Smoking status: Former    Packs/day: 1.00    Types: Cigarettes   Smokeless tobacco: Never   Tobacco comments:    Previous smoker; Started at the age of 23 then quit for several years. Started back again in 2018. Smokes pack a day.Patient did not want any cessation materials at this time.  Substance and Sexual Activity   Alcohol use: Yes    Alcohol/week: 7.0 standard drinks    Types: 7 Standard drinks or equivalent per week   Drug use: No   Sexual activity: Not on file  Other Topics Concern   Not on file  Social History Narrative   Lives alone. She has one son. She enjoys taking care of her cats and dogs. She does like to do yard work sometimes.   Social Determinants of Health   Financial Resource Strain: Low Risk    Difficulty of Paying Living Expenses: Not hard at all  Food Insecurity: No Food Insecurity   Worried About Charity fundraiser in the Last Year: Never true   [place] in the Last Year: Never true  Transportation Needs: No Transportation Needs   Lack of Transportation (Medical): No   Lack of Transportation (Non-Medical): No  Physical Activity: Inactive   Days of Exercise per Week: 0 days  Minutes of Exercise per Session: 0 min  Stress: No Stress Concern Present   Feeling of Stress : Not at all  Social Connections: Socially Isolated   Frequency of Communication with Friends and Family: Once a week   Frequency of Social Gatherings with Friends and Family: Once a week   Attends Religious Services: Never   Marine scientist or Organizations: No   Attends Music therapist: Never   Marital Status: Divorced  Human resources officer Violence: Not At Risk   Fear of Current or Ex-Partner: No   Emotionally Abused: No   Physically Abused: No   Sexually Abused: No    Outpatient Medications Prior to Visit  Medication Sig Dispense Refill   atorvastatin (LIPITOR) 80 MG tablet TAKE 1 TABLET AT BEDTIME 90 tablet 3    blood glucose meter kit and supplies Dispense based on patient and insurance preference. Use to check blood sugars once daily. DX: R73.03 1 each 0   Cholecalciferol (VITAMIN D3 GUMMIES) 25 MCG (1000 UT) CHEW Chew 1 each by mouth daily.     COPAXONE 40 MG/ML SOSY Inject 40 mg into the skin 3 (three) times a week.     cyanocobalamin (,VITAMIN B-12,) 1000 MCG/ML injection INJECT 1 ML (1,000 MCG TOTAL) INTO THE MUSCLE EVERY 21 ( TWENTY-ONE) DAYS. 4 mL 3   escitalopram (LEXAPRO) 10 MG tablet TAKE 1 TABLET (10 MG TOTAL) BY MOUTH DAILY. 90 tablet 1   modafinil (PROVIGIL) 200 MG tablet TAKE 1-2 TABLETS BY MOUTH AS NEEDED FOR FAUTIGUE  3   TRUE METRIX BLOOD GLUCOSE TEST test strip      TRUEplus Lancets 33G MISC      ezetimibe (ZETIA) 10 MG tablet Take 1 tablet (10 mg total) by mouth daily. (Patient not taking: No sig reported) 90 tablet 3   lisinopril-hydrochlorothiazide (ZESTORETIC) 20-25 MG tablet Take 1 tablet by mouth daily. Taking the 20-76m 90 tablet 1   No facility-administered medications prior to visit.    Allergies  Allergen Reactions   Crestor [Rosuvastatin] Other (See Comments)    FAtigue and myalgia   Januvia [Sitagliptin] Swelling   Metformin And Related Other (See Comments)    Diarrrhea   Ocrevus [Ocrelizumab] Diarrhea    Severed diarrhea   Penicillins     REACTION: hives   Zetia [Ezetimibe] Other (See Comments)    Extreme fatigue    ROS Review of Systems    Objective:    Physical Exam  BP (!) 164/92   Pulse 77   Ht 5' 6"  (1.676 m)   Wt 227 lb (103 kg)   SpO2 97%   BMI 36.64 kg/m  Wt Readings from Last 3 Encounters:  01/13/21 227 lb (103 kg)  10/13/20 228 lb (103.4 kg)  04/15/20 222 lb (100.7 kg)     There are no preventive care reminders to display for this patient.  There are no preventive care reminders to display for this patient.  Lab Results  Component Value Date   TSH 3.98 01/18/2018   Lab Results  Component Value Date   WBC 8.8 06/02/2015    HGB 13.4 06/02/2015   HCT 40.9 06/02/2015   MCV 83 06/02/2015   PLT 352 06/02/2015   Lab Results  Component Value Date   NA 138 10/14/2020   K 4.0 10/14/2020   CO2 26 10/14/2020   GLUCOSE 116 (H) 10/14/2020   BUN 13 10/14/2020   CREATININE 0.65 10/14/2020   BILITOT 0.7 10/14/2020   ALKPHOS 53  10/31/2016   AST 18 10/14/2020   ALT 19 10/14/2020   PROT 7.1 10/14/2020   ALBUMIN 4.4 10/31/2016   CALCIUM 9.8 10/14/2020   EGFR 101 10/14/2020   Lab Results  Component Value Date   CHOL 187 10/14/2020   Lab Results  Component Value Date   HDL 37 (L) 10/14/2020   Lab Results  Component Value Date   LDLCALC 125 (H) 10/14/2020   Lab Results  Component Value Date   TRIG 137 10/14/2020   Lab Results  Component Value Date   CHOLHDL 5.1 (H) 10/14/2020   Lab Results  Component Value Date   HGBA1C 6.2 (A) 01/13/2021      Assessment & Plan:   Problem List Items Addressed This Visit       Cardiovascular and Mediastinum   HYPERTENSION, BENIGN    Uncontrolled.  She was unable to tolerate 25 mg HCTZ pill.  Switched to valsartan HCT 320/12.5.  New prescription sent to mail order plan to follow-up in 3 months but I did encourage her to send me her blood pressures next month to make sure that it looks like they are improving when she gets the new medication in the mail.      Relevant Medications   valsartan-hydrochlorothiazide (DIOVAN-HCT) 320-12.5 MG tablet     Endocrine   IMPAIRED FASTING GLUCOSE    She is doing a great job.  A1c was 6.4 last time now it is down to 6.2 she is tried making some changes and has been tracking her blood sugars.        Other   Fatigue    Severe fatigue secondary to her MS.  Could consider stimulant therapy to improve energy levels.  But we cannot consider that until her blood pressure is better controlled      Depression, recurrent ([place])    Suggested that I think she would really benefit from therapy/counseling she had tried it not that  long ago but did not feel like she had a good receptive relationship with a therapist.  She is willing to be open to it again.  She does have Medicare.  Referral placed.      Relevant Orders   Ambulatory referral to [place] like her balance is significantly off frequently to the point where she feels like she may injure herself or fall.  We did discuss that working with a vestibular rehab specialist might be helpful it certainly would not completely resolve her symptoms but might improve them and they may [place] to teach her some things to reduce her risk for injury and fall.      Other Visit Diagnoses     Prediabetes    -  Primary   Relevant Orders   POCT glycosylated hemoglobin (Hb A1C) (Completed)   Need for immunization against influenza       Relevant Orders   Flu Vaccine QUAD 39moIM (Fluarix, Fluzone & Alfiuria Quad PF) (Completed)       Meds ordered this encounter  Medications   valsartan-hydrochlorothiazide (DIOVAN-HCT) 320-12.5 MG tablet    Sig: Take 1 tablet by mouth daily.    Dispense:  90 tablet    Refill:  0     Follow-up: Return in about 3 months (around 04/15/2021) for Diabetes follow-up, Hypertension.    CBeatrice Lecher MD

## 2021-01-13 NOTE — Assessment & Plan Note (Signed)
Uncontrolled.  She was unable to tolerate 25 mg HCTZ pill.  Switched to valsartan HCT 320/12.5.  New prescription sent to mail order plan to follow-up in 3 months but I did encourage her to send me her blood pressures next month to make sure that it looks like they are improving when she gets the new medication in the mail.

## 2021-01-13 NOTE — Assessment & Plan Note (Signed)
Feels like her balance is significantly off frequently to the point where she feels like she may injure herself or fall.  We did discuss that working with a vestibular rehab specialist might be helpful it certainly would not completely resolve her symptoms but might improve them and they may [place] to teach her some things to reduce her risk for injury and fall.

## 2021-01-13 NOTE — Assessment & Plan Note (Signed)
Suggested that I think she would really benefit from therapy/counseling she had tried it not that long ago but did not feel like she had a good receptive relationship with a therapist.  She is willing to be open to it again.  She does have Medicare.  Referral placed.

## 2021-01-31 DIAGNOSIS — E559 Vitamin D deficiency, unspecified: Secondary | ICD-10-CM | POA: Diagnosis not present

## 2021-01-31 DIAGNOSIS — Z79899 Other long term (current) drug therapy: Secondary | ICD-10-CM | POA: Diagnosis not present

## 2021-01-31 DIAGNOSIS — G35 Multiple sclerosis: Secondary | ICD-10-CM | POA: Diagnosis not present

## 2021-01-31 DIAGNOSIS — Z5181 Encounter for therapeutic drug level monitoring: Secondary | ICD-10-CM | POA: Diagnosis not present

## 2021-02-14 ENCOUNTER — Encounter: Payer: Self-pay | Admitting: Family Medicine

## 2021-02-14 DIAGNOSIS — I10 Essential (primary) hypertension: Secondary | ICD-10-CM

## 2021-02-14 MED ORDER — OLMESARTAN-AMLODIPINE-HCTZ 40-5-12.5 MG PO TABS: 1.0000 | ORAL_TABLET | Freq: Every day | ORAL | 0 refills | Status: DC

## 2021-02-14 NOTE — Telephone Encounter (Signed)
OK, will d/c valsartan hct and will change to tribezor.  Have her schedule a 2-week nurse visit on the new medication it sounds like there is a big discrepancy between her machine and an office machine.

## 2021-02-15 ENCOUNTER — Other Ambulatory Visit: Payer: Self-pay

## 2021-02-15 ENCOUNTER — Ambulatory Visit (INDEPENDENT_AMBULATORY_CARE_PROVIDER_SITE_OTHER): Payer: Medicare HMO | Admitting: Pharmacist

## 2021-02-15 DIAGNOSIS — E785 Hyperlipidemia, unspecified: Secondary | ICD-10-CM

## 2021-02-15 DIAGNOSIS — R7303 Prediabetes: Secondary | ICD-10-CM

## 2021-02-15 DIAGNOSIS — I10 Essential (primary) hypertension: Secondary | ICD-10-CM

## 2021-02-15 NOTE — Progress Notes (Signed)
Chronic Care Management Pharmacy Note  02/17/2021 Name:  Stephanie Perkins MRN:  [id number] DOB:  02/02/1961  Summary: addressed preDM, HTN, HLD. Recently changed BP medication, first dose 02/15/21.  Recommendations/Changes made from today's visit: continue current regimen, recommended patient check her BP at home for ongoing monitoring.  Plan: f/u with pharmacist in 3 months  Subjective: Stephanie Perkins is an 60 y.o. year old female who is a primary patient of Metheney, Rene Kocher, MD.  The CCM team was consulted for assistance with disease management and care coordination needs.    Engaged with patient by telephone for follow up visit in response to provider referral for pharmacy case management and/or care coordination services.   Consent to Services:  The patient was given information about Chronic Care Management services, agreed to services, and gave verbal consent prior to initiation of services.  Please see initial visit note for detailed documentation.   Patient Care Team: Hali Marry, MD as PCP - Andria Meuse, MD as Consulting Physician (Obstetrics and Gynecology) Ellwood Dense., MD as Referring Physician (Neurology) Darius Bump, [facility] as Pharmacist (Pharmacist)   Objective:  Lab Results  Component Value Date   CREATININE 0.65 10/14/2020   CREATININE 0.64 04/15/2020   CREATININE 0.60 10/21/2019    Lab Results  Component Value Date   HGBA1C 6.2 (A) 01/13/2021       Component Value Date/Time   CHOL 187 10/14/2020 0929   TRIG 137 10/14/2020 0929   HDL 37 (L) 10/14/2020 0929   CHOLHDL 5.1 (H) 10/14/2020 0929   VLDL 38 (H) 12/22/2015 1101   LDLCALC 125 (H) 10/14/2020 0929    Hepatic Function Latest Ref Rng & Units 10/14/2020 10/21/2019 10/10/2018  Total Protein 6.1 - 8.1 g/dL 7.1 6.6 7.1  Albumin 3.6 - 5.1 g/dL - - -  AST 10 - 35 U/L _0 ALT 6 - 29 U/L _1 Alk Phosphatase 33 - 130 U/L - - -  Total Bilirubin 0.2 - 1.2 mg/dL 0.7  0.6 0.6  Bilirubin, Direct 0.00 - 0.40 mg/dL - - -    Lab Results  Component Value Date/Time   TSH 3.98 01/18/2018 09:17 AM   TSH 2.57 10/31/2016 07:38 AM    CBC Latest Ref Rng & Units 06/02/2015 02/11/2013 11/15/2012  WBC 3.4 - 10.8 x10E3/uL 8.8 6.7 9.7  Hemoglobin 11.1 - 15.9 g/dL 13.4 14.4 13.9  Hematocrit 34.0 - 46.6 % 40.9 42.6 41.1  Platelets 150 - 379 x10E3/uL 352 299 258    Clinical ASCVD: No  The 10-year ASCVD risk score (Arnett DK, et al., 2019) is: 11.8%   Values used to calculate the score:     Age: 54 years     Sex: Female     Is Non-Hispanic African American: No     Diabetic: No     Tobacco smoker: Yes     Systolic Blood Pressure: 782 mmHg     Is BP treated: No     HDL Cholesterol: 37 mg/dL     Total Cholesterol: 187 mg/dL     Social History   Tobacco Use  Smoking Status Former   Packs/day: 1.00   Types: Cigarettes  Smokeless Tobacco Never  Tobacco Comments   Previous smoker; Started at the age of 74 then quit for several years. Started back again in 2018. Smokes pack a day.Patient did not want any cessation materials at this time.   BP Readings from Last 3  Encounters:  01/13/21 (!) 164/92  10/13/20 137/70  04/15/20 (!) 163/62   Pulse Readings from Last 3 Encounters:  01/13/21 77  10/13/20 79  04/15/20 79   Wt Readings from Last 3 Encounters:  01/13/21 227 lb (103 kg)  10/13/20 228 lb (103.4 kg)  04/15/20 222 lb (100.7 kg)    Assessment: Review of patient past medical history, allergies, medications, health status, including review of consultants reports, laboratory and other test data, was performed as part of comprehensive evaluation and provision of chronic care management services.   SDOH:  (Social Determinants of Health) assessments and interventions performed:    CCM Care Plan  Allergies  Allergen Reactions   Crestor [Rosuvastatin] Other (See Comments)    FAtigue and myalgia   Januvia [Sitagliptin] Swelling   Metformin And Related  Other (See Comments)    Diarrrhea   Ocrevus [Ocrelizumab] Diarrhea    Severed diarrhea   Penicillins     REACTION: hives   Zetia [Ezetimibe] Other (See Comments)    Extreme fatigue    Medications Reviewed Today     Reviewed by Darius Bump, [facility] (Pharmacist) on 02/15/21 at 1044  Med List Status: <None>   Medication Order Taking? Sig Documenting Provider Last Dose Status Informant  atorvastatin (LIPITOR) 80 MG tablet [id number] Yes TAKE 1 TABLET AT BEDTIME Hali Marry, MD Taking Active   blood glucose meter kit and supplies [id number] Yes Dispense based on patient and insurance preference. Use to check blood sugars once daily. DX: R73.03 Hali Marry, MD Taking Active   COPAXONE 40 MG/ML SOSY [id number] Yes Inject 40 mg into the skin 3 (three) times a week. [provider] Taking Active            Med Note Dorene Ar Aug 25, 2020 11:16 AM) 84m SQ injection 3x per week, MWF per patient & neurology  cyanocobalamin (,VITAMIN B-12,) 1000 MCG/ML injection [id number] INJECT 1 ML (1,000 MCG TOTAL) INTO THE MUSCLE EVERY 21 ( TWENTY-ONE) DAYS. MHali Marry MD Taking Active   escitalopram (LEXAPRO) 10 MG tablet [id number] TAKE 1 TABLET (10 MG TOTAL) BY MOUTH DAILY. MHali Marry MD Taking Active   modafinil (PROVIGIL) 200 MG tablet [id number] TAKE 1-2 TABLETS BY MOUTH AS NEEDED FOR FAberdeenProvider, Historical, MD Taking Active            Med Note (Teddy Spike  Wed Oct 15, 2019 11:18 AM)    Olmesartan-amLODIPine-HCTZ 40-5-12.5 MG TABS [id number] Take 1 tablet by mouth daily. MHali Marry MD Taking Active   TRUE METRIX BLOOD GLUCOSE TEST test strip [id number]  [provider] Taking Active   TRUEplus Lancets 33G [id number]  [provider] Taking Active   Vitamin D, Ergocalciferol, (DRISDOL) 1.25 MG (50000 UNIT) CAPS capsule [id number] Take 50,000 Units by mouth 2 (two) times a week.  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   Encounter for therapeutic drug  level monitoring 05/17/2015   Fatigue 05/17/2015   Encounter for monitoring immunomodulating therapy 05/17/2015   Lactose intolerance 12/01/2011   ANEMIA, PERNICIOUS 12/23/2009   IMPAIRED FASTING GLUCOSE 05/21/2009   MULTIPLE SCLEROSIS 11/22/2007   Hyperlipidemia 09/25/2007   Depression, recurrent ([place]) 09/25/2007   HYPERTENSION, BENIGN 09/25/2007    Immunization History  Administered Date(s) Administered   Influenza,inj,Quad PF,6+ Mos 03/13/2018, 01/13/2019, 04/15/2020, 01/13/2021   Influenza-Unspecified 01/21/2013   PFIZER(Purple Top)SARS-COV-2 Vaccination 06/27/2019, 07/22/2019    Conditions to be addressed/monitored: HTN, HLD, and preDM  Care Plan : Medication Management  Updates made by Darius Bump, [place] since 02/17/2021 12:00 AM     Problem: HTN, HLD, pre-DM      Long-Range Goal: Disease Progression Prevention   Start Date: 08/23/2020  Recent Progress: On track  Priority: High  Note:   Current Barriers:  Suboptimal therapeutic regimen for HTN, HLD  Pharmacist Clinical Goal(s):  Over the next 90 days, patient will achieve control of BP as evidenced by dose optimization and home BP resadings through collaboration with PharmD and provider.   Interventions: 1:1 collaboration with Hali Marry, MD regarding development and update of comprehensive plan of care as evidenced by provider attestation and  co-signature Inter-disciplinary care team collaboration (see longitudinal plan of care) Comprehensive medication review performed; medication list updated in electronic medical record  Pre-Diabetes:   Uncontrolled; current treatment:lifestyle modifications, has been incorporating more veggies in her diet after our last call!   Previously discussed current meal patterns:  B: atkins bars + coffee L: leftover pizza, soft taco (+beans, cheese), cheese & crackers w/bacon, anything quick/easy  D: pizza, chicken breast, pork chop, tacos Drinks: water/coffee, tea throughout day, and margarita sometimes with dinner  Previous extensive discussion on SMALL changes, focusing on goal of something green on the plate 2x per week, using freezer section/microwave steamables for convenience.   Exercise: limited by MS fatigue.   Recommend to continue current regimen by working on diet & small, attainable exercises. Also recommend following a1c closely and advised patient we will support her through the journey if/when medication is needed.  Hypertension:  Controlled; current treatment: olmesartan-amlodipine-hctz 40-5-12.83m daily (just changed, first dose 02/15/21),   Current home readings: SBP 150-160s, 180/94  Denies hypotensive/hypertensive symptoms  Recommended continue to check BP every 1-2 weeks, continue current medication.   Hyperlipidemia:  Uncontrolled; current treatment: atorvastatin 842m unable to tolerate new ezetimibe due to extreme fatigue, patient stopped taking it after 3 weeks of trying & stated felt better after she stopped it.   Medications previously tried: crestor (fatigue/myalgia)  Counseled on cholesterol management by lifestyle + taking medication Recommend continue current regimen, encouraged tighter control with diet choices if patient is unable to utilize medication, patient expressed understanding.   Patient Goals/Self-Care Activities Over the next 90 days, patient will:  -  take medications as prescribed,  - check blood pressure every 1-2 weeks, document, and provide at future appointments and  - engage in dietary modifications by aiming for greens on the plate 2x per week, and incorporating small attainable exercises such as leg lifts & arm circles during commercial breaks while watching tv.  Follow Up Plan: Telephone follow up appointment with care management team member scheduled for: 3 months      Medication Assistance: None required.  Patient affirms current coverage meets needs.  [facility] [facility]  [place]: [phone]:  [phone]   Follow Up:  Patient agrees to Care Plan and Follow-up.  Plan: Telephone follow up appointment with care management team member scheduled for:  3 months  Darius Bump

## 2021-02-17 NOTE — Patient Instructions (Signed)
Visit Information  Thank you for taking time to visit with me today. Please don't hesitate to contact me if I can be of assistance to you before our next scheduled telephone appointment.  Telephone follow up appointment with care management team member scheduled for: 1 month  If you need to cancel or re-schedule our visit, please call [phone] and our care guide team will be happy to assist you.  Following is a list of the goals we discussed today:  Patient Goals/Self-Care Activities Over the next 30 days, patient will:  - take medications as prescribed,  - check blood pressure every 1-2 weeks, document, and provide at future appointments and  - engage in dietary modifications by aiming for greens on the plate 2x per week, and incorporating small attainable exercises such as leg lifts & arm circles during commercial breaks while watching tv.  Follow Up Plan: Telephone follow up appointment with care management team member scheduled for: 1 month  Patient verbalizes understanding of instructions provided today and agrees to view in MyChart.   Gabriel Carina

## 2021-02-23 ENCOUNTER — Other Ambulatory Visit: Payer: Self-pay | Admitting: Family Medicine

## 2021-02-28 ENCOUNTER — Other Ambulatory Visit: Payer: Self-pay

## 2021-02-28 ENCOUNTER — Ambulatory Visit (INDEPENDENT_AMBULATORY_CARE_PROVIDER_SITE_OTHER): Payer: Medicare HMO | Admitting: Family Medicine

## 2021-02-28 VITALS — BP 136/69 | HR 85 | Temp 98.3°F

## 2021-02-28 DIAGNOSIS — I10 Essential (primary) hypertension: Secondary | ICD-10-CM

## 2021-02-28 MED ORDER — OLMESARTAN-AMLODIPINE-HCTZ 40-5-12.5 MG PO TABS
1.0000 | ORAL_TABLET | Freq: Every day | ORAL | 0 refills | Status: DC
Start: 2021-02-28 — End: 2021-05-05

## 2021-02-28 NOTE — Progress Notes (Signed)
Patient presents today as a nurse visit for a blood pressure check.  Patient states she is taking her medication as prescribed without any side effects/adverse effects. Medication and allergy list reviewed with patient and the pharmacy has been verified.   HA: No Dizziness/lightheadedness: No Fever: No BA: No Weakness/Fatigue: No  Sinus pain/pressure: No  Runny nose: No  ST: No  ShOB: No  CP: No  Palps: No Abd pain: No Dysuria: No  N/V/C/D: No    Vital Signs at 2:39 PM Blood Pressure: 135/70 Pulse: 87 SpO2: 96%  Vital Signs at 2:49 PM Blood Pressure: 136/69 Pulse: 85 SpO2: 96%   Information shared with Dr. Linford Arnold who instructed me to tell pt to continue current regiment, continue to monitor BP at home and keep a log, and to keep the appointment already scheduled on 04/15/2021 at 1:20 PM.   Pt aware and verbalized understanding. Pt escorted to check out for check out.

## 2021-02-28 NOTE — Patient Instructions (Addendum)
Continue current treatment with olmesartan-amLODIPine-HCTZ 40-5-12.5 MG daily A refill has been sent to [facility] for you Continue to monitor your blood pressure at home Keep the follow up appointment that is already scheduled for 04/15/21

## 2021-03-02 DIAGNOSIS — I10 Essential (primary) hypertension: Secondary | ICD-10-CM | POA: Diagnosis not present

## 2021-03-02 DIAGNOSIS — E785 Hyperlipidemia, unspecified: Secondary | ICD-10-CM

## 2021-03-08 ENCOUNTER — Other Ambulatory Visit: Payer: Self-pay

## 2021-03-08 ENCOUNTER — Other Ambulatory Visit: Payer: Self-pay | Admitting: Family Medicine

## 2021-03-08 ENCOUNTER — Ambulatory Visit: Payer: Medicare HMO | Admitting: Pharmacist

## 2021-03-08 DIAGNOSIS — R7303 Prediabetes: Secondary | ICD-10-CM

## 2021-03-08 DIAGNOSIS — E785 Hyperlipidemia, unspecified: Secondary | ICD-10-CM

## 2021-03-08 DIAGNOSIS — I10 Essential (primary) hypertension: Secondary | ICD-10-CM

## 2021-03-08 NOTE — Progress Notes (Signed)
Chronic Care Management Pharmacy Note  03/08/2021 Name:  Stephanie Perkins MRN:  [id number] DOB:  [birth year]  Summary: addressed preDM, HTN, HLD. Doing well on new BP medication, olmesartan-amlodipine-hctz 40-5-12.62m daily.  Last checked at home on 02/28/21 = 136/84  Recommendations/Changes made from today's visit: continue current regimen, reinforced patient check her BP at home for ongoing monitoring.  Plan: f/u with pharmacist in 2 months  Subjective: Stephanie MARESis an 60y.o. year old female who is a primary patient of Metheney, CRene Kocher MD.  The CCM team was consulted for assistance with disease management and care coordination needs.    Engaged with patient by telephone for follow up visit in response to provider referral for pharmacy case management and/or care coordination services.   Consent to Services:  The patient was given information about Chronic Care Management services, agreed to services, and gave verbal consent prior to initiation of services.  Please see initial visit note for detailed documentation.   Patient Care Team: MHali Marry MD as PCP - GAndria Meuse MD as Consulting Physician (Obstetrics and Gynecology) MEllwood Dense, MD as Referring Physician (Neurology) KDarius Bump RKnapp Medical Centeras Pharmacist (Pharmacist)   Objective:  Lab Results  Component Value Date   CREATININE 0.65 10/14/2020   CREATININE 0.64 04/15/2020   CREATININE 0.60 10/21/2019    Lab Results  Component Value Date   HGBA1C 6.2 (A) 01/13/2021       Component Value Date/Time   CHOL 187 10/14/2020 0929   TRIG 137 10/14/2020 0929   HDL 37 (L) 10/14/2020 0929   CHOLHDL 5.1 (H) 10/14/2020 0929   VLDL 38 (H) 12/22/2015 1101   LDLCALC 125 (H) 10/14/2020 0929    Hepatic Function Latest Ref Rng & Units 10/14/2020 10/21/2019 10/10/2018  Total Protein 6.1 - 8.1 g/dL 7.1 6.6 7.1  Albumin 3.6 - 5.1 g/dL - - -  AST 10 - 35 U/L 18 15 17   ALT 6 - 29 U/L 19 17 20   Alk  Phosphatase 33 - 130 U/L - - -  Total Bilirubin 0.2 - 1.2 mg/dL 0.7 0.6 0.6  Bilirubin, Direct 0.00 - 0.40 mg/dL - - -    Lab Results  Component Value Date/Time   TSH 3.98 01/18/2018 09:17 AM   TSH 2.57 10/31/2016 07:38 AM    CBC Latest Ref Rng & Units 06/02/2015 02/11/2013 11/15/2012  WBC 3.4 - 10.8 x10E3/uL 8.8 6.7 9.7  Hemoglobin 11.1 - 15.9 g/dL 13.4 14.4 13.9  Hematocrit 34.0 - 46.6 % 40.9 42.6 41.1  Platelets 150 - 379 x10E3/uL 352 299 258    Clinical ASCVD: No  The 10-year ASCVD risk score (Arnett DK, et al., 2019) is: 9.5%   Values used to calculate the score:     Age: 60years     Sex: Female     Is Non-Hispanic African American: No     Diabetic: No     Tobacco smoker: Yes     Systolic Blood Pressure: 1106mmHg     Is BP treated: No     HDL Cholesterol: 37 mg/dL     Total Cholesterol: 187 mg/dL     Social History   Tobacco Use  Smoking Status Former   Packs/day: 1.00   Types: Cigarettes  Smokeless Tobacco Never  Tobacco Comments   Previous smoker; Started at the age of 60then quit for several years. Started back again in 2018. Smokes pack a day.Patient did not want any cessation  materials at this time.   BP Readings from Last 3 Encounters:  02/28/21 136/69  01/13/21 (!) 164/92  10/13/20 137/70   Pulse Readings from Last 3 Encounters:  02/28/21 85  01/13/21 77  10/13/20 79   Wt Readings from Last 3 Encounters:  01/13/21 227 lb (103 kg)  10/13/20 228 lb (103.4 kg)  04/15/20 222 lb (100.7 kg)    Assessment: Review of patient past medical history, allergies, medications, health status, including review of consultants reports, laboratory and other test data, was performed as part of comprehensive evaluation and provision of chronic care management services.   SDOH:  (Social Determinants of Health) assessments and interventions performed:    CCM Care Plan  Allergies  Allergen Reactions   Crestor [Rosuvastatin] Other (See Comments)    FAtigue and  myalgia   Januvia [Sitagliptin] Swelling   Metformin And Related Other (See Comments)    Diarrrhea   Ocrevus [Ocrelizumab] Diarrhea    Severed diarrhea   Penicillins     REACTION: hives   Zetia [Ezetimibe] Other (See Comments)    Extreme fatigue    Medications Reviewed Today     Reviewed by Darius Bump, [facility] (Pharmacist) on 03/08/21 at 1046  Med List Status: <None>   Medication Order Taking? Sig Documenting Provider Last Dose Status Informant  atorvastatin (LIPITOR) 80 MG tablet [id number] Yes TAKE 1 TABLET AT BEDTIME Hali Marry, MD Taking Active   blood glucose meter kit and supplies [id number] Yes Dispense based on patient and insurance preference. Use to check blood sugars once daily. DX: R73.03 Hali Marry, MD Taking Active   COPAXONE 40 MG/ML SOSY [id number] Yes Inject 40 mg into the skin 3 (three) times a week. [provider] Taking Active            Med Note Dorene Ar Aug 25, 2020 11:16 AM) 63m SQ injection 3x per week, MWF per patient & neurology  cyanocobalamin (,VITAMIN B-12,) 1000 MCG/ML injection [id number] INJECT 1 ML (1,000 MCG TOTAL) INTO THE MUSCLE EVERY 21 ( TWENTY-ONE) DAYS. MHali Marry MD Taking Active   escitalopram (LEXAPRO) 10 MG tablet [id number] TAKE 1 TABLET (10 MG TOTAL) BY MOUTH DAILY. MHali Marry MD Taking Active   modafinil (PROVIGIL) 200 MG tablet [id number] TAKE 1-2 TABLETS BY MOUTH AS NEEDED FOR FPelhamProvider, Historical, MD Taking Active            Med Note (Teddy Spike  Wed Oct 15, 2019 11:18 AM)    Olmesartan-amLODIPine-HCTZ 40-5-12.5 MG TABS [id number] Take 1 tablet by mouth daily. MHali Marry MD Taking Active   TRUE METRIX BLOOD GLUCOSE TEST test strip [id number]  [provider] Taking Active   TRUEplus Lancets 33G [id number]  [provider] Taking Active   Vitamin D, Ergocalciferol, (DRISDOL) 1.25 MG (50000 UNIT) CAPS  capsule [id number] Take 50,000 Units by mouth 2 (two) times a week. 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   Encounter for therapeutic drug level monitoring 05/17/2015   Fatigue 05/17/2015   Encounter for monitoring immunomodulating therapy 05/17/2015   Lactose intolerance 12/01/2011   ANEMIA, PERNICIOUS 12/23/2009   IMPAIRED FASTING GLUCOSE 05/21/2009   MULTIPLE SCLEROSIS 11/22/2007   Hyperlipidemia 09/25/2007   Depression, recurrent ([place]) 09/25/2007   HYPERTENSION, BENIGN 09/25/2007    Immunization History  Administered Date(s) Administered   Influenza,inj,Quad PF,6+ Mos 03/13/2018, 01/13/2019, 04/15/2020, 01/13/2021   Influenza-Unspecified 01/21/2013   PFIZER(Purple Top)SARS-COV-2 Vaccination 06/27/2019, 07/22/2019, 01/19/2021    Conditions to be addressed/monitored: HTN, HLD, and preDM  Care Plan : Medication Management  Updates made by Darius Bump, [place] since 03/08/2021 12:00 AM     Problem: HTN, HLD, pre-DM      Long-Range Goal: Disease Progression Prevention   Start Date: 08/23/2020  Recent Progress: On track  Priority: High  Note:   Current Barriers:  Suboptimal therapeutic regimen for HTN, HLD  Pharmacist Clinical Goal(s):  Over the next 90 days, patient will achieve control of BP as evidenced by dose optimization and home BP resadings through collaboration with PharmD and provider.   Interventions: 1:1 collaboration with Hali Marry, MD regarding development and update of  comprehensive plan of care as evidenced by provider attestation and co-signature Inter-disciplinary care team collaboration (see longitudinal plan of care) Comprehensive medication review performed; medication list updated in electronic medical record  Pre-Diabetes:   Uncontrolled; current treatment:lifestyle modifications, has been incorporating more veggies in her diet after our last call!   Previously discussed current meal patterns:  B: atkins bars + coffee L: leftover pizza, soft taco (+beans, cheese), cheese & crackers w/bacon, anything quick/easy  D: pizza, chicken breast, pork chop, tacos Drinks: water/coffee, tea throughout day, and margarita sometimes with dinner  Previous extensive discussion on SMALL changes, focusing on goal of something green on the plate 2x per week, using freezer section/microwave steamables for convenience.   Exercise: limited by MS fatigue.   Recommend to continue current regimen by working on diet & small, attainable exercises. Also recommend following a1c closely and advised patient we will support her through the journey if/when medication is needed.  Hypertension:  Controlled; current treatment: olmesartan-amlodipine-hctz 40-5-12.79m daily,   Current home readings: SBP 130s  Denies hypotensive/hypertensive symptoms  Recommended continue to check BP every 1-2 weeks, continue current medication.   Hyperlipidemia:  Uncontrolled; current treatment: atorvastatin 872m unable to tolerate new ezetimibe due to extreme fatigue, patient stopped taking it after 3 weeks of trying & stated felt better after she stopped it.   Medications previously tried: crestor (fatigue/myalgia)  Counseled on cholesterol management by lifestyle + taking medication Recommend continue current regimen, encouraged tighter control with diet choices if patient is unable to utilize medication, patient expressed understanding.   Patient Goals/Self-Care Activities Over the next 90 days,  patient will:  - take medications as prescribed,  - check blood pressure 1-2x per week, document, and provide at future appointments and  - engage in dietary modifications by aiming for greens on the plate 2x per week, and incorporating small attainable exercises such as leg lifts & arm circles during commercial breaks while watching tv.  Follow Up Plan: Telephone follow up appointment with care management team member scheduled for: 2 months       Medication Assistance: None required.  Patient affirms current coverage meets needs.  Patient's preferred pharmacy is:  [address] [address] Phone: [phone] Fax: [phone]  [id number] [place] [facility] [address] [place]Phone: [phone]: [phone]  Follow Up:  Patient agrees to Care Plan and Follow-up.  Plan: Telephone follow up appointment with care management team member scheduled for:  2 months  KLarinda Buttery PharmD Clinical Pharmacist [phone]

## 2021-03-08 NOTE — Patient Instructions (Signed)
Visit Information  Thank you for taking time to visit with me today. Please don't hesitate to contact me if I can be of assistance to you before our next scheduled telephone appointment.  Following are the goals we discussed today:   Patient Goals/Self-Care Activities Over the next 90 days, patient will:  - take medications as prescribed,  - check blood pressure 1-2x per week, document, and provide at future appointments and  - engage in dietary modifications by aiming for greens on the plate 2x per week, and incorporating small attainable exercises such as leg lifts & arm circles during commercial breaks while watching tv.  Follow Up Plan: Telephone follow up appointment with care management team member scheduled for: 2 months  Please call the care guide team at [phone] if you need to cancel or reschedule your appointment.   If you are experiencing a Mental Health or Behavioral Health Crisis or need someone to talk to, please call the Suicide and Crisis Lifeline: 988 call 1-800-273-TALK (toll free, 24 hour hotline)   Patient verbalizes understanding of instructions provided today and agrees to view in MyChart.   Gabriel Carina

## 2021-03-09 ENCOUNTER — Ambulatory Visit (INDEPENDENT_AMBULATORY_CARE_PROVIDER_SITE_OTHER): Payer: Medicare HMO | Admitting: Psychologist

## 2021-03-09 DIAGNOSIS — F331 Major depressive disorder, recurrent, moderate: Secondary | ICD-10-CM | POA: Diagnosis not present

## 2021-03-09 NOTE — Progress Notes (Signed)
[facility] Counselor Initial Adult Exam  Name: Stephanie Perkins Date: 03/09/2021 MRN: [id number] DOB: 1960/05/17 PCP: Hali Marry, MD  Time spent: 10:03 am to 10:33 am; Total Time 30 minutes.   This session was held via video webex teletherapy due to the coronavirus risk at this time. The patient consented to video teletherapy and was located at her home during this session. She is aware it is the responsibility of the patient to secure confidentiality on her end of the session. The provider was in a private home office for the duration of this session. Limits of confidentiality were discussed with the patient.   Guardian/Payee:  NA    Paperwork requested: No   Reason for Visit /Presenting Problem: Patient stated "I feel lonely and just need someone to talk to". Elaborating, patient stated that she has experienced depressive symptoms for the last 5 years.   Mental Status Exam: Appearance:   Neat     Behavior:  Appropriate  Motor:  Normal  Speech/Language:   Normal Rate  Affect:  Blunt and Flat  Mood:  depressed  Thought process:  normal  Thought content:    WNL  Sensory/Perceptual disturbances:    WNL  Orientation:  oriented to person, place, time/date, and situation  Attention:  Fair  Concentration:  Fair  Memory:  WNL  Fund of knowledge:   Fair  Insight:    Poor  Judgment:   Poor  Impulse Control:  Good and Fair     Reported Symptoms:  Patient endorsed experiencing the following: feeling down, low self-esteem, lack of motivation, fatigue, avoiding pleasurable activities, social isolation, rumination of negative thoughts, and thoughts of hopelessness and worthlessness. She denied suicidal and homicidal ideation.   Risk Assessment: Danger to Self:  No Self-injurious Behavior: No Danger to Others: No Duty to Warn:no Physical Aggression / Violence:No  Access to Firearms a concern: No  Gang Involvement:No  Patient / guardian was educated about  steps to take if suicide or homicide risk level increases between visits: n/a While future psychiatric events cannot be accurately predicted, the patient does not currently require acute inpatient psychiatric care and does not currently meet [facility] involuntary commitment criteria.  Substance Abuse History: Current substance abuse: Yes   Patient stated that she smokes a pack daily of cigarettes. She also stated that she consumes at least one alcoholic beverage per day.   Past Psychiatric History:   Previous psychological history is significant for depression Outpatient Providers:Patient stated that she saw [name] in 2019. Per the patient, it was not helpful. She stated that she did not feel like [name] was empathetic. She also stated that she did not like being seen only once a month.  History of Psych Hospitalization: No  Psychological Testing:  NA    Abuse History:  Victim of: No.,  NA    Report needed: No. Victim of Neglect:No. Perpetrator of  NA   Witness / Exposure to Domestic Violence: No   Protective Services Involvement: No  Witness to [employer] Violence:  No   Family History:  Family History  Problem Relation Age of Onset   Hyperlipidemia Mother    Hypertension Mother    Alcohol abuse Maternal Grandfather    Cancer Maternal Grandfather     Living situation: the patient lives alone. Patient has three cats and two dogs.   Sexual Orientation: Straight  Relationship Status: divorced  Name of spouse / other:NA  If a parent, number of children / ages:  Patient has one son who is 61 years old.   Support Systems: lives alone. Patient stated that she has no social support.   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   Stressors: Other: Patient stated that living alone and not having anyone to socialize with is very difficult for her.     Strengths: Patient denied having any type of social support in her life.   Barriers:  Patient stated that she lives alone and has no social support in her life.    Legal History: Pending legal issue / charges:  NA. History of legal issue / charges:  NA  Medical History/Surgical History: reviewed Past Medical History:  Diagnosis Date   Depression    Hyperlipidemia    Hypertension    Multiple sclerosis ([place])    Vision abnormalities     Past Surgical History:  Procedure Laterality Date   APPENDECTOMY  1-99   CESAREAN SECTION  9-85   TOTAL VAGINAL HYSTERECTOMY  03/2007   for fibroids, Dr. Radene Knee   TUBAL LIGATION  2002   Patient stated that she experiences high blood pressure, high cholesterol, prediabetes, and was diagnosed with MS in 1998.   Medications: Current Outpatient Medications  Medication Sig Dispense Refill   atorvastatin (LIPITOR) 80 MG tablet TAKE 1 TABLET AT BEDTIME 90 tablet 3   blood glucose meter kit and supplies Dispense based on patient and insurance preference. Use to check blood sugars once daily. DX: R73.03 1 each 0   COPAXONE 40 MG/ML SOSY Inject 40 mg into the skin 3 (three) times a week.     cyanocobalamin (,VITAMIN B-12,) 1000 MCG/ML injection INJECT 1 ML (1,000 MCG TOTAL) INTO THE MUSCLE EVERY 21 ( TWENTY-ONE) DAYS. 4 mL 3   escitalopram (LEXAPRO) 10 MG tablet TAKE 1 TABLET (10 MG TOTAL) BY MOUTH DAILY. 90 tablet 1   modafinil (PROVIGIL) 200 MG tablet TAKE 1-2 TABLETS BY MOUTH AS NEEDED FOR FAUTIGUE  3   Olmesartan-amLODIPine-HCTZ 40-5-12.5 MG TABS Take 1 tablet by mouth daily. 90 tablet 0   TRUE METRIX BLOOD GLUCOSE TEST test strip      TRUEplus Lancets 33G MISC      Vitamin D, Ergocalciferol, (DRISDOL) 1.25 MG (50000 UNIT) CAPS capsule Take 50,000 Units by mouth 2  (two) times a week. Taking Saturdays & Wednesdays     No current facility-administered medications for this visit.    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 The patient lives with her three cats and two dogs. The patient meets criteria for a diagnosis of F33.1 major depressive affective disorder, recurrent, moderate based off of the following: Patient endorsed experiencing the following: feeling down, low self-esteem, lack of motivation, fatigue, avoiding pleasurable activities, social  isolation, rumination of negative thoughts, and thoughts of hopelessness and worthlessness. She denied suicidal and homicidal ideation.   When asked her goals for counseling patient stated "I don't know. I just want someone to talk with". Patient stated that at this time she does not want counseling, but did not elaborate as to why she does not want counseling. Patient stated that she understood when informed that if she changes her mind she is welcomed to contact the [facility] to schedule an appointment.  This psychologist makes the recommendation that the patient would benefit from counseling services, however the patient is not interested in counseling services at this time.    Conception Chancy, PsyD

## 2021-04-15 ENCOUNTER — Encounter: Payer: Self-pay | Admitting: Family Medicine

## 2021-04-15 ENCOUNTER — Ambulatory Visit (INDEPENDENT_AMBULATORY_CARE_PROVIDER_SITE_OTHER): Payer: Medicare HMO | Admitting: Family Medicine

## 2021-04-15 ENCOUNTER — Other Ambulatory Visit: Payer: Self-pay

## 2021-04-15 VITALS — BP 135/69 | HR 83 | Temp 98.5°F | Resp 18 | Ht 66.0 in | Wt 228.0 lb

## 2021-04-15 DIAGNOSIS — F339 Major depressive disorder, recurrent, unspecified: Secondary | ICD-10-CM | POA: Diagnosis not present

## 2021-04-15 DIAGNOSIS — I10 Essential (primary) hypertension: Secondary | ICD-10-CM

## 2021-04-15 DIAGNOSIS — R7301 Impaired fasting glucose: Secondary | ICD-10-CM

## 2021-04-15 LAB — POCT GLYCOSYLATED HEMOGLOBIN (HGB A1C): Hemoglobin A1C: 6.6 % — AB (ref 4.0–5.6)

## 2021-04-15 NOTE — Progress Notes (Signed)
Established Patient Office Visit  Subjective:  Patient ID: Stephanie Perkins, female    DOB: 1960/07/14  Age: 61 y.o. MRN: [id number]  CC:  Chief Complaint  Patient presents with   Hypertension    Follow up    Prediabetes    Follow up     HPI Stephanie Perkins presents for   Hypertension- Pt denies chest pain, SOB, dizziness, or heart palpitations.  Taking meds as directed w/o problems.  Denies medication side effects.    Impaired fasting glucose-no increased thirst or urination. No symptoms consistent with hypoglycemia.  MS-current regimen has not changed.  She is currently on Copaxone  Past Medical History:  Diagnosis Date   Depression    Hyperlipidemia    Hypertension    Multiple sclerosis ([place])    Vision abnormalities     Past Surgical History:  Procedure Laterality Date   APPENDECTOMY  1-99   CESAREAN SECTION  9-85   TOTAL VAGINAL HYSTERECTOMY  03/2007   for fibroids, Dr. Radene Knee   TUBAL LIGATION  2002    Family History  Problem Relation Age of Onset   Hyperlipidemia Mother    Hypertension Mother    Alcohol abuse Maternal Grandfather    Cancer Maternal Grandfather     Social History   Socioeconomic History   Marital status: Divorced    Spouse name: Not on file   Number of children: Not on file   Years of education: 16   Highest education level: Bachelor's degree (e.g., BA, AB, BS)  Occupational History   Occupation: Disabled  Tobacco Use   Smoking status: Former    Packs/day: 1.00    Types: Cigarettes   Smokeless tobacco: Never   Tobacco comments:    Previous smoker; Started at the age of 74 then quit for several years. Started back again in 2018. Smokes pack a day.Patient did not want any cessation materials at this time.  Substance and Sexual Activity   Alcohol use: Yes    Alcohol/week: 7.0 standard drinks    Types: 7 Standard drinks or equivalent per week   Drug use: No   Sexual activity: Not on file  Other Topics  Concern   Not on file  Social History Narrative   Lives alone. She has one son. She enjoys taking care of her cats and dogs. She does like to do yard work sometimes.   Social Determinants of Health   Financial Resource Strain: Low Risk    Difficulty of Paying Living Expenses: Not hard at all  Food Insecurity: No Food Insecurity   Worried About Charity fundraiser in the Last Year: Never true   [place] in the Last Year: Never true  Transportation Needs: No Transportation Needs   Lack of Transportation (Medical): No   Lack of Transportation (Non-Medical): No  Physical Activity: Inactive   Days of Exercise per Week: 0 days   Minutes of Exercise per Session: 0 min  Stress: No Stress Concern Present   Feeling of Stress : Not at all  Social Connections: Socially Isolated   Frequency of Communication with Friends and Family: Once a week   Frequency of Social Gatherings with Friends and Family: Once a week   Attends Religious Services: Never   Marine scientist or Organizations: No   Attends Archivist Meetings: Never   Marital Status: Divorced  Human resources officer Violence: Not At Risk   Fear of Current or Ex-Partner: No   Emotionally  Abused: No   Physically Abused: No   Sexually Abused: No    Outpatient Medications Prior to Visit  Medication Sig Dispense Refill   atorvastatin (LIPITOR) 80 MG tablet TAKE 1 TABLET AT BEDTIME 90 tablet 3   blood glucose meter kit and supplies Dispense based on patient and insurance preference. Use to check blood sugars once daily. DX: R73.03 1 each 0   COPAXONE 40 MG/ML SOSY Inject 40 mg into the skin 3 (three) times a week.     cyanocobalamin (,VITAMIN B-12,) 1000 MCG/ML injection INJECT 1 ML (1,000 MCG TOTAL) INTO THE MUSCLE EVERY 21 ( TWENTY-ONE) DAYS. 4 mL 3   escitalopram (LEXAPRO) 10 MG tablet TAKE 1 TABLET (10 MG TOTAL) BY MOUTH DAILY. 90 tablet 1   modafinil (PROVIGIL) 200 MG tablet TAKE 1-2 TABLETS  BY MOUTH AS NEEDED FOR FAUTIGUE  3   Olmesartan-amLODIPine-HCTZ 40-5-12.5 MG TABS Take 1 tablet by mouth daily. 90 tablet 0   TRUE METRIX BLOOD GLUCOSE TEST test strip      TRUEplus Lancets 33G MISC      Vitamin D, Ergocalciferol, (DRISDOL) 1.25 MG (50000 UNIT) CAPS capsule Take 50,000 Units by mouth 2 (two) times a week. Taking Saturdays & Wednesdays     No facility-administered medications prior to visit.    Allergies  Allergen Reactions   Crestor [Rosuvastatin] Other (See Comments)    FAtigue and myalgia   Januvia [Sitagliptin] Swelling   Metformin And Related Other (See Comments)    Diarrrhea   Ocrevus [Ocrelizumab] Diarrhea    Severed diarrhea   Penicillins     REACTION: hives   Zetia [Ezetimibe] Other (See Comments)    Extreme fatigue    ROS Review of Systems    Objective:    Physical Exam Constitutional:      Appearance: Normal appearance. She is well-developed.  HENT:     Head: Normocephalic and atraumatic.  Cardiovascular:     Rate and Rhythm: Normal rate and regular rhythm.     Heart sounds: Normal heart sounds.  Pulmonary:     Effort: Pulmonary effort is normal.     Breath sounds: Normal breath sounds.  Skin:    General: Skin is warm and dry.  Neurological:     Mental Status: She is alert and oriented to person, place, and time.  Psychiatric:        Behavior: Behavior normal.   BP 135/69    Pulse 83    Temp 98.5 F (36.9 C)    Resp 18    Ht 5' 6"  (1.676 m)    Wt 228 lb (103.4 kg)    SpO2 94%    BMI 36.80 kg/m  Wt Readings from Last 3 Encounters:  04/15/21 228 lb (103.4 kg)  01/13/21 227 lb (103 kg)  10/13/20 228 lb (103.4 kg)     Health Maintenance Due  Topic Date Due   Zoster Vaccines- Shingrix (2 of 2) 04/09/2021    There are no preventive care reminders to display for this patient.  Lab Results  Component Value Date   TSH 3.98 01/18/2018   Lab Results  Component Value Date   WBC 8.8 06/02/2015   HGB 13.4 06/02/2015    HCT 40.9 06/02/2015   MCV 83 06/02/2015   PLT 352 06/02/2015   Lab Results  Component Value Date   NA 138 10/14/2020   K 4.0 10/14/2020   CO2 26 10/14/2020   GLUCOSE 116 (H) 10/14/2020   BUN 13 10/14/2020  CREATININE 0.65 10/14/2020   BILITOT 0.7 10/14/2020   ALKPHOS 53 10/31/2016   AST 18 10/14/2020   ALT 19 10/14/2020   PROT 7.1 10/14/2020   ALBUMIN 4.4 10/31/2016   CALCIUM 9.8 10/14/2020   EGFR 101 10/14/2020   Lab Results  Component Value Date   CHOL 187 10/14/2020   Lab Results  Component Value Date   HDL 37 (L) 10/14/2020   Lab Results  Component Value Date   LDLCALC 125 (H) 10/14/2020   Lab Results  Component Value Date   TRIG 137 10/14/2020   Lab Results  Component Value Date   CHOLHDL 5.1 (H) 10/14/2020   Lab Results  Component Value Date   HGBA1C 6.2 (A) 01/13/2021      Assessment & Plan:   Problem List Items Addressed This Visit       Cardiovascular and Mediastinum   HYPERTENSION, BENIGN - Primary    Well controlled. Continue current regimen. Follow up in 6 mo. systolic is up just a little bit.  She says she has gotten some elevated blood pressures at home but often times that is checking it right before she takes her medication so encouraged her to check it couple hours after she takes her medication to see if it still well controlled if she still getting some high numbers and encouraged her to reach out through [facility] in the next couple of weeks.      Relevant Orders   BASIC METABOLIC PANEL WITH GFR     Endocrine   IMPAIRED FASTING GLUCOSE    Not well controlled. She reports hasn't been good with her diet over the holidays and birth day. She plans to get back on track.  F/U in 3 mo       Relevant Orders   POCT HgB A1C     Other   Depression, recurrent (HCC)    Overall she is doing okay on the Lexapro.  She said she did connect with a therapist once and says afterwards just realized that what she felt she needed most was just  somebody to listen and be supportive.  And that she did not think working with a therapist was quite where she was at at this point in time.  But I just encouraged her to reach out if at any point she felt like we could offer any additional support or if she would like to reconnect with a therapist.       No orders of the defined types were placed in this encounter.   Follow-up: Return in about 3 months (around 07/14/2021) for Pre-Diabetes follow-up.    Beatrice Lecher, MD

## 2021-04-15 NOTE — Assessment & Plan Note (Signed)
Overall she is doing okay on the Lexapro.  She said she did connect with a therapist once and says afterwards just realized that what she felt she needed most was just somebody to listen and be supportive.  And that she did not think working with a therapist was quite where she was at at this point in time.  But I just encouraged her to reach out if at any point she felt like we could offer any additional support or if she would like to reconnect with a therapist.

## 2021-04-15 NOTE — Assessment & Plan Note (Signed)
Not well controlled. She reports hasn't been good with her diet over the holidays and birth day. She plans to get back on track.  F/U in 3 mo

## 2021-04-15 NOTE — Assessment & Plan Note (Addendum)
Well controlled. Continue current regimen. Follow up in 6 mo. systolic is up just a little bit.  She says she has gotten some elevated blood pressures at home but often times that is checking it right before she takes her medication so encouraged her to check it couple hours after she takes her medication to see if it still well controlled if she still getting some high numbers and encouraged her to reach out through MyChart in the next couple of weeks.

## 2021-04-16 LAB — BASIC METABOLIC PANEL WITH GFR
BUN: 12 mg/dL (ref 7–25)
CO2: 29 mmol/L (ref 20–32)
Calcium: 10.1 mg/dL (ref 8.6–10.4)
Chloride: 102 mmol/L (ref 98–110)
Creat: 0.64 mg/dL (ref 0.50–1.05)
Glucose, Bld: 180 mg/dL — ABNORMAL HIGH (ref 65–99)
Potassium: 4 mmol/L (ref 3.5–5.3)
Sodium: 140 mmol/L (ref 135–146)
eGFR: 100 mL/min/{1.73_m2} (ref >=60)

## 2021-04-18 NOTE — Progress Notes (Signed)
Hi Stephanie Perkins, your metabolic panel looks good except that your glucose was 180.  I assume you were not fasting.

## 2021-05-05 ENCOUNTER — Other Ambulatory Visit: Payer: Self-pay

## 2021-05-05 ENCOUNTER — Ambulatory Visit (INDEPENDENT_AMBULATORY_CARE_PROVIDER_SITE_OTHER): Payer: Medicare HMO | Admitting: Pharmacist

## 2021-05-05 DIAGNOSIS — I10 Essential (primary) hypertension: Secondary | ICD-10-CM

## 2021-05-05 DIAGNOSIS — R7301 Impaired fasting glucose: Secondary | ICD-10-CM

## 2021-05-05 DIAGNOSIS — E785 Hyperlipidemia, unspecified: Secondary | ICD-10-CM

## 2021-05-05 MED ORDER — OLMESARTAN-AMLODIPINE-HCTZ 40-10-25 MG PO TABS: 1.0000 | ORAL_TABLET | Freq: Every day | ORAL | 1 refills | Status: DC

## 2021-05-05 NOTE — Patient Instructions (Signed)
Visit Information  Thank you for taking time to visit with me today. Please don't hesitate to contact me if I can be of assistance to you before our next scheduled telephone appointment.  Following are the goals we discussed today:  Patient Goals/Self-Care Activities Over the next 90 days, patient will:  - take medications as prescribed,  - check blood pressure 1-2x per week, document, and provide at future appointments and  - engage in dietary modifications by aiming for greens on the plate 2x per week, and incorporating small attainable exercises such as leg lifts & arm circles during commercial breaks while watching tv.  Follow Up Plan: Telephone follow up appointment with care management team member scheduled for: 3-5 months  Please call the care guide team at [phone] if you need to cancel or reschedule your appointment.   If you are experiencing a Mental Health or Behavioral Health Crisis or need someone to talk to, please call the Suicide and Crisis Lifeline: 988 call 1-800-273-TALK (toll free, 24 hour hotline)   Patient verbalizes understanding of instructions and care plan provided today and agrees to view in MyChart. Active MyChart status confirmed with patient.    Gabriel Carina

## 2021-05-05 NOTE — Progress Notes (Signed)
Chronic Care Management Pharmacy Note  05/05/2021 Name:  Stephanie Perkins MRN:  [id number] DOB:  Nov 23, 1960  Summary: addressed preDM, HTN, HLD. Doing well on new BP medication, olmesartan-amlodipine-hctz 40-5-12.47m daily.  BP readings from home: 147/77, 149/79   Recommendations/Changes made from today's visit: consider dose increase to: olmesartan-amlodipine-hctz 40-10-25mg tablet daily to optimize BP control  Plan: f/u with pharmacist in 3-5 months  Subjective: Stephanie LOVINSis an 61y.o. year old female who is a primary patient of Stephanie Perkins, CRene Kocher MD.  The CCM team was consulted for assistance with disease management and care coordination needs.    Engaged with patient by telephone for follow up visit in response to provider referral for pharmacy case management and/or care coordination services.   Consent to Services:  The patient was given information about Chronic Care Management services, agreed to services, and gave verbal consent prior to initiation of services.  Please see initial visit note for detailed documentation.   Patient Care Team: MHali Marry MD as PCP - GAndria Meuse MD as Consulting Physician (Obstetrics and Gynecology) MEllwood Dense, MD as Referring Physician (Neurology) KDarius [facility] RKing[facility]aughters [facility] Centeras Pharmacist (Pharmacist)   Objective:  Lab Results  Component Value Date   CREATININE 0.64 04/15/2021   CREATININE 0.65 10/14/2020   CREATININE 0.64 04/15/2020    Lab Results  Component Value Date   HGBA1C 6.6 (A) 04/15/2021       Component Value Date/Time   CHOL 187 10/14/2020 0929   TRIG 137 10/14/2020 0929   HDL 37 (L) 10/14/2020 0929   CHOLHDL 5.1 (H) 10/14/2020 0929   VLDL 38 (H) 12/22/2015 1101   LDLCALC 125 (H) 10/14/2020 0929    Hepatic Function Latest Ref Rng & Units 10/14/2020 10/21/2019 10/10/2018  Total Protein 6.1 - 8.1 g/dL 7.1 6.6 7.1  Albumin 3.6 - 5.1 g/dL - - -  AST 10 - 35 U/L 18 15 17   ALT 6 - 29 U/L 19 17  20   Alk Phosphatase 33 - 130 U/L - - -  Total Bilirubin 0.2 - 1.2 mg/dL 0.7 0.6 0.6  Bilirubin, Direct 0.00 - 0.40 mg/dL - - -    Lab Results  Component Value Date/Time   TSH 3.98 01/18/2018 09:17 AM   TSH 2.57 10/31/2016 07:38 AM    CBC Latest Ref Rng & Units 06/02/2015 02/11/2013 11/15/2012  WBC 3.4 - 10.8 x10E3/uL 8.8 6.7 9.7  Hemoglobin 11.1 - 15.9 g/dL 13.4 14.4 13.9  Hematocrit 34.0 - 46.6 % 40.9 42.6 41.1  Platelets 150 - 379 x10E3/uL 352 299 258    Clinical ASCVD: No  The 10-year ASCVD risk score (Arnett DK, et al., 2019) is: 10%   Values used to calculate the score:     Age: 61years     Sex: Female     Is Non-Hispanic African American: No     Diabetic: No     Tobacco smoker: Yes     Systolic Blood Pressure: 1734mmHg     Is BP treated: No     HDL Cholesterol: 37 mg/dL     Total Cholesterol: 187 mg/dL     Social History   Tobacco Use  Smoking Status Former   Packs/day: 1.00   Types: Cigarettes  Smokeless Tobacco Never  Tobacco Comments   Previous smoker; Started at the age of 61then quit for several years. Started back again in 2018. Smokes pack a day.Patient did not want any cessation materials at  this time.   BP Readings from Last 3 Encounters:  04/15/21 135/69  02/28/21 136/69  01/13/21 (!) 164/92   Pulse Readings from Last 3 Encounters:  04/15/21 83  02/28/21 85  01/13/21 77   Wt Readings from Last 3 Encounters:  04/15/21 228 lb (103.4 kg)  01/13/21 227 lb (103 kg)  10/13/20 228 lb (103.4 kg)    Assessment: Review of patient past medical history, allergies, medications, health status, including review of consultants reports, laboratory and other test data, was performed as part of comprehensive evaluation and provision of chronic care management services.   SDOH:  (Social Determinants of Health) assessments and interventions performed:    CCM Care Plan  Allergies  Allergen Reactions   Crestor [Rosuvastatin] Other (See Comments)    FAtigue  and myalgia   Januvia [Sitagliptin] Swelling   Metformin And Related Other (See Comments)    Diarrrhea   Ocrevus [Ocrelizumab] Diarrhea    Severed diarrhea   Penicillins     REACTION: hives   Zetia [Ezetimibe] Other (See Comments)    Extreme fatigue    Medications Reviewed Today     Reviewed by Stephanie Perkins, CMA (Certified Medical Assistant) on 04/15/21 at 1328  Med List Status: <None>   Medication Order Taking? Sig Documenting Provider Last Dose Status Informant  atorvastatin (LIPITOR) 80 MG tablet [id number] Yes TAKE 1 TABLET AT BEDTIME Hali Marry, MD Taking Active   blood glucose meter kit and supplies [id number] Yes Dispense based on patient and insurance preference. Use to check blood sugars once daily. DX: R73.03 Hali Marry, MD Taking Active   COPAXONE 40 MG/ML SOSY [id number] Yes Inject 40 mg into the skin 3 (three) times a week. [provider] Taking Active            Med Note Dorene Ar Aug 25, 2020 11:16 AM) 19m SQ injection 3x per week, MWF per patient & neurology  cyanocobalamin (,VITAMIN B-12,) 1000 MCG/ML injection [id number] INJECT 1 ML (1,000 MCG TOTAL) INTO THE MUSCLE EVERY 21 ( TWENTY-ONE) DAYS. MHali Marry MD Taking Active   escitalopram (LEXAPRO) 10 MG tablet [id number] TAKE 1 TABLET (10 MG TOTAL) BY MOUTH DAILY. MHali Marry MD Taking Active   modafinil (PROVIGIL) 200 MG tablet [id number] TAKE 1-2 TABLETS BY MOUTH AS NEEDED FOR FMillers CreekProvider, Historical, MD Taking Active            Med Note (Teddy Spike  Wed Oct 15, 2019 11:18 AM)    Olmesartan-amLODIPine-HCTZ 40-5-12.5 MG TABS [id number] Take 1 tablet by mouth daily. MHali Marry MD Taking Active   TRUE METRIX BLOOD GLUCOSE TEST test strip [id number]  [provider] Taking Active   TRUEplus Lancets 33G [id number]  [provider] Taking Active   Vitamin D, Ergocalciferol, (DRISDOL) 1.25 MG  (50000 UNIT) CAPS capsule [id number] Take 50,000 Units by mouth 2 (two) times a week. 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  Encounter for therapeutic drug level monitoring 05/17/2015   Fatigue 05/17/2015   Encounter for monitoring immunomodulating therapy 05/17/2015   Lactose intolerance 12/01/2011   ANEMIA, PERNICIOUS 12/23/2009   IMPAIRED FASTING GLUCOSE 05/21/2009   MULTIPLE SCLEROSIS 11/22/2007   Hyperlipidemia 09/25/2007   Depression, recurrent ([place]) 09/25/2007   HYPERTENSION, BENIGN 09/25/2007    Immunization History  Administered Date(s) Administered   Influenza,inj,Quad PF,6+ Mos 03/13/2018, 01/13/2019, 04/15/2020, 01/13/2021   Influenza-Unspecified 01/21/2013   PFIZER(Purple Top)SARS-COV-2 Vaccination 06/27/2019, 07/22/2019, 01/19/2021   Zoster Recombinat (Shingrix) 02/12/2021    Conditions to be addressed/monitored: HTN, HLD, and preDM  There are no care plans that you recently modified to display for this patient.      Medication Assistance: None required.  Patient affirms current coverage meets needs.  Patient's preferred pharmacy is:  [facility] [place], [place] [address] Phone: [phone] Fax: [phone]    Follow Up:  Patient agrees to Care Plan and Follow-up.  Plan: Telephone follow up appointment with care management team member scheduled for:  2 months  Larinda Buttery, PharmD Clinical Pharmacist [facility] [phone]

## 2021-05-05 NOTE — Progress Notes (Signed)
Meds ordered this encounter  Medications   Olmesartan-amLODIPine-HCTZ 40-10-25 MG TABS    Sig: Take 1 tablet by mouth daily.    Dispense:  90 tablet    Refill:  1   New rx sent to pharmacy.

## 2021-05-05 NOTE — Addendum Note (Signed)
Addended by: Nani Gasser D on: 05/05/2021 01:11 PM   Modules accepted: Orders

## 2021-05-09 ENCOUNTER — Other Ambulatory Visit: Payer: Self-pay | Admitting: Family Medicine

## 2021-05-09 DIAGNOSIS — Z1231 Encounter for screening mammogram for malignant neoplasm of breast: Secondary | ICD-10-CM

## 2021-05-11 ENCOUNTER — Other Ambulatory Visit: Payer: Self-pay

## 2021-05-11 ENCOUNTER — Ambulatory Visit (INDEPENDENT_AMBULATORY_CARE_PROVIDER_SITE_OTHER): Payer: Medicare HMO

## 2021-05-11 DIAGNOSIS — Z1231 Encounter for screening mammogram for malignant neoplasm of breast: Secondary | ICD-10-CM

## 2021-05-12 NOTE — Progress Notes (Signed)
Please call patient. Normal mammogram.  Repeat in 1 year.  

## 2021-05-14 ENCOUNTER — Other Ambulatory Visit: Payer: Self-pay | Admitting: Family Medicine

## 2021-05-14 DIAGNOSIS — F339 Major depressive disorder, recurrent, unspecified: Secondary | ICD-10-CM

## 2021-05-18 ENCOUNTER — Other Ambulatory Visit: Payer: Self-pay | Admitting: Family Medicine

## 2021-05-31 DIAGNOSIS — I10 Essential (primary) hypertension: Secondary | ICD-10-CM

## 2021-05-31 DIAGNOSIS — E785 Hyperlipidemia, unspecified: Secondary | ICD-10-CM

## 2021-07-14 ENCOUNTER — Encounter: Payer: Self-pay | Admitting: Family Medicine

## 2021-07-14 ENCOUNTER — Ambulatory Visit (INDEPENDENT_AMBULATORY_CARE_PROVIDER_SITE_OTHER): Payer: Medicare HMO | Admitting: Family Medicine

## 2021-07-14 VITALS — BP 117/61 | HR 82 | Resp 18 | Ht 66.0 in | Wt 225.0 lb

## 2021-07-14 DIAGNOSIS — R5383 Other fatigue: Secondary | ICD-10-CM

## 2021-07-14 DIAGNOSIS — F339 Major depressive disorder, recurrent, unspecified: Secondary | ICD-10-CM

## 2021-07-14 DIAGNOSIS — G35 Multiple sclerosis: Secondary | ICD-10-CM

## 2021-07-14 DIAGNOSIS — R7301 Impaired fasting glucose: Secondary | ICD-10-CM | POA: Diagnosis not present

## 2021-07-14 DIAGNOSIS — I10 Essential (primary) hypertension: Secondary | ICD-10-CM

## 2021-07-14 LAB — POCT GLYCOSYLATED HEMOGLOBIN (HGB A1C): Hemoglobin A1C: 6.1 % — AB (ref 4.0–5.6)

## 2021-07-14 NOTE — Assessment & Plan Note (Signed)
Well controlled. Continue current regimen. Follow up in  6 mo  

## 2021-07-14 NOTE — Progress Notes (Signed)
? ? ? ?Established Patient Office Visit ? ?Subjective:  ?Patient ID: Stephanie Perkins, female    DOB: 23-May-1960  Age: 61 y.o. MRN: [id number] ? ?CC:  ?Chief Complaint  ?Patient presents with  ? Prediabetes  ?  Follow up   ? Hypertension  ?  Follow up   ? ? ?HPI ?Stephanie Perkins presents for  ? ?Hypertension- Pt denies chest pain, SOB, dizziness, or heart palpitations.  Taking meds as directed w/o problems.  Denies medication side effects.   ? ?Impaired fasting glucose-no increased thirst or urination. No symptoms consistent with hypoglycemia. ? ?Follow-up depression-she is currently on Lexapro 10 mg daily.  She feels happy with her current regimen.  She still struggles significantly with low energy.  She is on Provigil. ? ?Past Medical History:  ?Diagnosis Date  ? Depression   ? Hyperlipidemia   ? Hypertension   ? Multiple sclerosis ([employer])   ? Vision abnormalities   ? ? ?Past Surgical History:  ?Procedure Laterality Date  ? APPENDECTOMY  1-99  ? CESAREAN SECTION  9-85  ? TOTAL VAGINAL HYSTERECTOMY  03/2007  ? for fibroids, Dr. Radene Knee  ? TUBAL LIGATION  2002  ? ? ?Family History  ?Problem Relation Age of Onset  ? Hyperlipidemia Mother   ? Hypertension Mother   ? Alcohol abuse Maternal Grandfather   ? Cancer Maternal Grandfather   ? ? ?Social History  ? ?Socioeconomic History  ? Marital status: Divorced  ?  Spouse name: Not on file  ? Number of children: Not on file  ? Years of education: 24  ? Highest education level: Bachelor's degree (e.g., BA, AB, BS)  ?Occupational History  ? Occupation: Disabled  ?Tobacco Use  ? Smoking status: Every Day  ?  Packs/day: 1.00  ?  Types: Cigarettes  ? Smokeless tobacco: Never  ? Tobacco comments:  ?  Patient smokes 1 pack a day   ?Substance and Sexual Activity  ? Alcohol use: Yes  ?  Alcohol/week: 7.0 standard drinks  ?  Types: 7 Standard drinks or equivalent per week  ? Drug use: No  ? Sexual activity: Not on file  ?Other Topics Concern  ? Not on file  ?Social History Narrative  ?  Lives alone. She has one son. She enjoys taking care of her cats and dogs. She does like to do yard work sometimes.  ? ?Social Determinants of Health  ? ?Financial Resource Strain: Low Risk   ? Difficulty of Paying Living Expenses: Not hard at all  ?Food Insecurity: No Food Insecurity  ? Worried About Charity fundraiser in the Last Year: Never true  ? Ran Out of Food in the Last Year: Never true  ?Transportation Needs: No Transportation Needs  ? Lack of Transportation (Medical): No  ? Lack of Transportation (Non-Medical): No  ?Physical Activity: Inactive  ? Days of Exercise per Week: 0 days  ? Minutes of Exercise per Session: 0 min  ?Stress: No Stress Concern Present  ? Feeling of Stress : Not at all  ?Social Connections: Socially Isolated  ? Frequency of Communication with Friends and Family: Once a week  ? Frequency of Social Gatherings with Friends and Family: Once a week  ? Attends Religious Services: Never  ? Active Member of Clubs or Organizations: No  ? Attends Archivist Meetings: Never  ? Marital Status: Divorced  ?Intimate Partner Violence: Not At Risk  ? Fear of Current or Ex-Partner: No  ? Emotionally Abused: No  ?  Physically Abused: No  ? Sexually Abused: No  ? ? ?Outpatient Medications Prior to Visit  ?Medication Sig Dispense Refill  ? atorvastatin (LIPITOR) 80 MG tablet TAKE 1 TABLET AT BEDTIME 90 tablet 1  ? blood glucose meter kit and supplies Dispense based on patient and insurance preference. Use to check blood sugars once daily. DX: R73.03 1 each 0  ? COPAXONE 40 MG/ML SOSY Inject 40 mg into the skin 3 (three) times a week.    ? cyanocobalamin (,VITAMIN B-12,) 1000 MCG/ML injection INJECT 1 ML (1,000 MCG TOTAL) INTO THE MUSCLE EVERY 21 ( TWENTY-ONE) DAYS. 4 mL 3  ? escitalopram (LEXAPRO) 10 MG tablet TAKE 1 TABLET EVERY DAY 90 tablet 1  ? glucosamine-chondroitin 500-400 MG tablet Take 1 tablet by mouth daily.    ? modafinil (PROVIGIL) 200 MG tablet TAKE 1-2 TABLETS BY MOUTH AS NEEDED  FOR FAUTIGUE  3  ? Olmesartan-amLODIPine-HCTZ 40-10-25 MG TABS Take 1 tablet by mouth daily. 90 tablet 1  ? TRUE METRIX BLOOD GLUCOSE TEST test strip     ? TRUEplus Lancets 33G MISC     ? Vitamin D, Ergocalciferol, (DRISDOL) 1.25 MG (50000 UNIT) CAPS capsule Take 50,000 Units by mouth 2 (two) times a week. Taking Saturdays & Wednesdays    ? ?No facility-administered medications prior to visit.  ? ? ?Allergies  ?Allergen Reactions  ? Crestor [Rosuvastatin] Other (See Comments)  ?  FAtigue and myalgia  ? Januvia [Sitagliptin] Swelling  ? Metformin And Related Other (See Comments)  ?  Diarrrhea  ? Ocrevus [Ocrelizumab] Diarrhea  ?  Severed diarrhea  ? Penicillins   ?  REACTION: hives  ? Zetia [Ezetimibe] Other (See Comments)  ?  Extreme fatigue  ? ? ?ROS ?Review of Systems ? ?  ?Objective:  ?  ?Physical Exam ?Constitutional:   ?   Appearance: Normal appearance. She is well-developed.  ?HENT:  ?   Head: Normocephalic and atraumatic.  ?Cardiovascular:  ?   Rate and Rhythm: Normal rate and regular rhythm.  ?   Heart sounds: Normal heart sounds.  ?Pulmonary:  ?   Effort: Pulmonary effort is normal.  ?   Breath sounds: Normal breath sounds.  ?Skin: ?   General: Skin is warm and dry.  ?Neurological:  ?   Mental Status: She is alert and oriented to person, place, and time.  ?Psychiatric:     ?   Behavior: Behavior normal.  ? ?BP 117/61   Pulse 82   Resp 18   Ht _0  (1.676 m)   Wt 225 lb (102.1 kg)   SpO2 94%   BMI 36.32 kg/m?  ?Wt Readings from Last 3 Encounters:  ?07/14/21 225 lb (102.1 kg)  ?04/15/21 228 lb (103.4 kg)  ?01/13/21 227 lb (103 kg)  ? ? ? ?There are no preventive care reminders to display for this patient. ? ? ?There are no preventive care reminders to display for this patient. ? ?Lab Results  ?Component Value Date  ? TSH 3.98 01/18/2018  ? ?Lab Results  ?Component Value Date  ? WBC 8.8 06/02/2015  ? HGB 13.4 06/02/2015  ? HCT 40.9 06/02/2015  ? MCV 83 06/02/2015  ? PLT 352 06/02/2015  ? ?Lab Results   ?Component Value Date  ? NA 140 04/15/2021  ? K 4.0 04/15/2021  ? CO2 29 04/15/2021  ? GLUCOSE 180 (H) 04/15/2021  ? BUN 12 04/15/2021  ? CREATININE 0.64 04/15/2021  ? BILITOT 0.7 10/14/2020  ? ALKPHOS 53 10/31/2016  ?  AST 18 10/14/2020  ? ALT 19 10/14/2020  ? PROT 7.1 10/14/2020  ? ALBUMIN 4.4 10/31/2016  ? CALCIUM 10.1 04/15/2021  ? EGFR 100 04/15/2021  ? ?Lab Results  ?Component Value Date  ? CHOL 187 10/14/2020  ? ?Lab Results  ?Component Value Date  ? HDL 37 (L) 10/14/2020  ? ?Lab Results  ?Component Value Date  ? LDLCALC 125 (H) 10/14/2020  ? ?Lab Results  ?Component Value Date  ? TRIG 137 10/14/2020  ? ?Lab Results  ?Component Value Date  ? CHOLHDL 5.1 (H) 10/14/2020  ? ?Lab Results  ?Component Value Date  ? HGBA1C 6.1 (A) 07/14/2021  ? ? ?  ?Assessment & Plan:  ? ?Problem List Items Addressed This Visit   ? ?  ? Cardiovascular and Mediastinum  ? HYPERTENSION, BENIGN - Primary  ?  Well controlled. Continue current regimen. Follow up in  29mo ?  ?  ?  ? Endocrine  ? IMPAIRED FASTING GLUCOSE  ?  A1c looks phenomenal today at 6.1.  She is really doing great.  Plan to follow back up in 6 months. ?  ?  ? Relevant Orders  ? POCT HgB A1C (Completed)  ?  ? Nervous and Auditory  ? MULTIPLE SCLEROSIS  ?  Currently on Copaxone. ?  ?  ?  ? Other  ? Fatigue  ?  Severe fatigue secondary to MS.  She has been on Provigil for years.  She said she even stopped it for about a week just to see if it was causing some of the fatigue at the end of the day but it did not seem to be a problem.  She has never tried [place]  She said she was given a prescription years ago for Adderall but at the time it was too expensive so she never actually tried it.  She said she might be open to trying something different in the future but will think about it and let me know. ?  ?  ? Depression, recurrent ([place]  ?  She feels like overall she is doing well on her Lexapro she just really still struggles with her fatigue and energy levels.  She does  not want make any changes today.  Follow-up as needed. ?  ?  ? ? ?No orders of the defined types were placed in this encounter. ? ? ?Follow-up: Return in about 6 months (around 01/13/2022) for Hypertension and

## 2021-07-14 NOTE — Assessment & Plan Note (Signed)
She feels like overall she is doing well on her Lexapro she just really still struggles with her fatigue and energy levels.  She does not want make any changes today.  Follow-up as needed. ?

## 2021-07-14 NOTE — Assessment & Plan Note (Signed)
Severe fatigue secondary to MS.  She has been on Provigil for years.  She said she even stopped it for about a week just to see if it was causing some of the fatigue at the end of the day but it did not seem to be a problem.  She has never tried [place].  She said she was given a prescription years ago for Adderall but at the time it was too expensive so she never actually tried it.  She said she might be open to trying something different in the future but will think about it and let me know. ?

## 2021-07-14 NOTE — Assessment & Plan Note (Signed)
A1c looks phenomenal today at 6.1.  She is really doing great.  Plan to follow back up in 6 months. ?

## 2021-07-14 NOTE — Assessment & Plan Note (Signed)
Currently on Copaxone. ?

## 2021-09-08 ENCOUNTER — Ambulatory Visit (INDEPENDENT_AMBULATORY_CARE_PROVIDER_SITE_OTHER): Payer: Medicare HMO | Admitting: Pharmacist

## 2021-09-08 DIAGNOSIS — R7301 Impaired fasting glucose: Secondary | ICD-10-CM

## 2021-09-08 DIAGNOSIS — E785 Hyperlipidemia, unspecified: Secondary | ICD-10-CM

## 2021-09-08 DIAGNOSIS — I10 Essential (primary) hypertension: Secondary | ICD-10-CM

## 2021-09-08 NOTE — Patient Instructions (Signed)
Visit Information  Thank you for taking time to visit with me today. Please don't hesitate to contact me if I can be of assistance to you before our next scheduled telephone appointment.  Following are the goals we discussed today:  Patient Goals/Self-Care Activities Over the next 180 days, patient will:  - take medications as prescribed,  - check blood pressure 1-2x per week, document, and provide at future appointments and  - engage in dietary modifications by aiming for greens on the plate 2x per week, and incorporating small attainable exercises such as leg lifts & arm circles during commercial breaks while watching tv.  Follow Up Plan: Telephone follow up appointment with care management team member scheduled for: 6 months  Please call the care guide team at [phone] if you need to cancel or reschedule your appointment.    Patient verbalizes understanding of instructions and care plan provided today and agrees to view in MyChart. Active MyChart status and patient understanding of how to access instructions and care plan via MyChart confirmed with patient.     Stephanie Perkins

## 2021-09-08 NOTE — Progress Notes (Signed)
Chronic Care Management Pharmacy Note  09/08/2021 Name:  Stephanie Perkins MRN:  [id number] DOB:  1960-04-05  Summary: addressed preDM, HTN, HLD. Doing well on new BP medication, olmesartan-amlodipine-hctz 40-10-25mg daily.  She got a puppy in March, and has been enjoying walking the dog but notes to have knee pain with more activity. She treats with salonpas patches onto knee, biofreeze spray, and advil.  For nutrition: she is working on portion sizes.  She has not been checking BP at home recently.  Recommendations/Changes made from today's visit: No changes at this time, advised patient to check BP at home and counseled on goal numbers.  Plan: f/u with pharmacist in 6 months  Subjective: Stephanie Perkins is an 61 y.o. year old female who is a primary patient of Metheney, Rene Kocher, MD.  The CCM team was consulted for assistance with disease management and care coordination needs.    Engaged with patient by telephone for follow up visit in response to provider referral for pharmacy case management and/or care coordination services.   Consent to Services:  The patient was given information about Chronic Care Management services, agreed to services, and gave verbal consent prior to initiation of services.  Please see initial visit note for detailed documentation.   Patient Care Team: Hali Marry, MD as PCP - Andria Meuse, MD as Consulting Physician (Obstetrics and Gynecology) Ellwood Dense., MD as Referring Physician (Neurology) Darius Bump, [facility] as Pharmacist (Pharmacist)   Objective:  Lab Results  Component Value Date   CREATININE 0.64 04/15/2021   CREATININE 0.65 10/14/2020   CREATININE 0.64 04/15/2020    Lab Results  Component Value Date   HGBA1C 6.1 (A) 07/14/2021       Component Value Date/Time   CHOL 187 10/14/2020 0929   TRIG 137 10/14/2020 0929   HDL 37 (L) 10/14/2020 0929   CHOLHDL 5.1 (H) 10/14/2020 0929   VLDL 38 (H) 12/22/2015 1101    LDLCALC 125 (H) 10/14/2020 0929       Latest Ref Rng & Units 10/14/2020    9:29 AM 10/21/2019    9:16 AM 10/10/2018   11:17 AM  Hepatic Function  Total Protein 6.1 - 8.1 g/dL 7.1  6.6  7.1   AST 10 - 35 U/L _0 ALT 6 - 29 U/L _1 Total Bilirubin 0.2 - 1.2 mg/dL 0.7  0.6  0.6     Lab Results  Component Value Date/Time   TSH 3.98 01/18/2018 09:17 AM   TSH 2.57 10/31/2016 07:38 AM       Latest Ref Rng & Units 06/02/2015   11:23 AM 02/11/2013    9:21 AM 11/15/2012    6:04 PM  CBC  WBC 3.4 - 10.8 x10E3/uL 8.8  6.7  9.7   Hemoglobin 11.1 - 15.9 g/dL 13.4  14.4  13.9   Hematocrit 34.0 - 46.6 % 40.9  42.6  41.1   Platelets 150 - 379 x10E3/uL 352  299  258     Clinical ASCVD: No  The 10-year ASCVD risk score (Arnett DK, et al., 2019) is: 7.6%   Values used to calculate the score:     Age: 61 years     Sex: Female     Is Non-Hispanic African American: No     Diabetic: No     Tobacco smoker: Yes     Systolic Blood Pressure: 239 mmHg  Is BP treated: No     HDL Cholesterol: 37 mg/dL     Total Cholesterol: 187 mg/dL     Social History   Tobacco Use  Smoking Status Every Day   Packs/day: 1.00   Types: Cigarettes  Smokeless Tobacco Never  Tobacco Comments   Patient smokes 1 pack a day    BP Readings from Last 3 Encounters:  07/14/21 117/61  04/15/21 135/69  02/28/21 136/69   Pulse Readings from Last 3 Encounters:  07/14/21 82  04/15/21 83  02/28/21 85   Wt Readings from Last 3 Encounters:  07/14/21 225 lb (102.1 kg)  04/15/21 228 lb (103.4 kg)  01/13/21 227 lb (103 kg)    Assessment: Review of patient past medical history, allergies, medications, health status, including review of consultants reports, laboratory and other test data, was performed as part of comprehensive evaluation and provision of chronic care management services.   SDOH:  (Social Determinants of Health) assessments and interventions performed:    CCM Care  Plan  Allergies  Allergen Reactions   Crestor [Rosuvastatin] Other (See Comments)    FAtigue and myalgia   Januvia [Sitagliptin] Swelling   Metformin And Related Other (See Comments)    Diarrrhea   Ocrevus [Ocrelizumab] Diarrhea    Severed diarrhea   Penicillins     REACTION: hives   Zetia [Ezetimibe] Other (See Comments)    Extreme fatigue    Medications Reviewed Today     Reviewed by Hali Marry, MD (Physician) on 07/14/21 at 1431  Med List Status: <None>   Medication Order Taking? Sig Documenting Provider Last Dose Status Informant  atorvastatin (LIPITOR) 80 MG tablet [id number] Yes TAKE 1 TABLET AT BEDTIME Hali Marry, MD Taking Active   blood glucose meter kit and supplies [id number] Yes Dispense based on patient and insurance preference. Use to check blood sugars once daily. DX: R73.03 Hali Marry, MD Taking Active   COPAXONE 40 MG/ML SOSY [id number] Yes Inject 40 mg into the skin 3 (three) times a week. [provider] Taking Active            Med Note Dorene Ar Aug 25, 2020 11:16 AM) 52m SQ injection 3x per week, MWF per patient & neurology  cyanocobalamin (,VITAMIN B-12,) 1000 MCG/ML injection [id number] INJECT 1 ML (1,000 MCG TOTAL) INTO THE MUSCLE EVERY 21 ( TWENTY-ONE) DAYS. MHali Marry MD Taking Active   escitalopram (LEXAPRO) 10 MG tablet [id number] TAKE 1 TABLET EVERY DAY MHali Marry MD Taking Active   glucosamine-chondroitin 500-400 MG tablet [id number] Take 1 tablet by mouth daily. [provider] Taking Active   modafinil (PROVIGIL) 200 MG tablet [id number] TAKE 1-2 TABLETS BY MOUTH AS NEEDED FOR FHigginsProvider, Historical, MD Taking Active            Med Note (Maryruth Eve TONYA L   Wed Oct 15, 2019 11:18 AM)    Olmesartan-amLODIPine-HCTZ 40-10-25 MG TABS [id number] Take 1 tablet by mouth daily. MHali Marry MD Taking Active   TRUE METRIX BLOOD GLUCOSE TEST test  strip [id number]  [provider] Taking Active   TRUEplus Lancets 33G MSt. [id number]  [provider] Taking Active   Vitamin D, Ergocalciferol, (DRISDOL) 1.25 MG (50000 UNIT) CAPS capsule [id number] Take 50,000 Units by mouth 2 (two) times a week. 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   Encounter for therapeutic drug level monitoring 05/17/2015   Fatigue 05/17/2015   Encounter for monitoring immunomodulating therapy 05/17/2015   Lactose intolerance 12/01/2011   ANEMIA, PERNICIOUS 12/23/2009   IMPAIRED FASTING GLUCOSE 05/21/2009   MULTIPLE SCLEROSIS 11/22/2007   Hyperlipidemia 09/25/2007   Depression, recurrent ([place]) 09/25/2007   HYPERTENSION, BENIGN 09/25/2007    Immunization History  Administered Date(s) Administered   Influenza,inj,Quad PF,6+ Mos 03/13/2018, 01/13/2019, 04/15/2020, 01/13/2021   Influenza-Unspecified 01/21/2013   PFIZER(Purple Top)SARS-COV-2 Vaccination 06/27/2019, 07/22/2019, 01/19/2021   Zoster Recombinat (Shingrix) 02/12/2021, 04/20/2021    Conditions to be addressed/monitored: HTN, HLD, and preDM  There are no care plans that you recently modified to display for this patient.      Medication Assistance: None required.  Patient affirms current coverage meets needs.  Patient's preferred pharmacy is:  [facility], [place] [address] Phone: [phone] Fax: [phone]    Follow Up:  Patient agrees to  Care Plan and Follow-up.  Plan: Telephone follow up appointment with care management team member scheduled for:  6 months  Larinda Buttery, PharmD Clinical Pharmacist [facility] [phone]

## 2021-09-29 ENCOUNTER — Other Ambulatory Visit: Payer: Self-pay | Admitting: Family Medicine

## 2021-09-29 DIAGNOSIS — I10 Essential (primary) hypertension: Secondary | ICD-10-CM

## 2021-09-30 DIAGNOSIS — I10 Essential (primary) hypertension: Secondary | ICD-10-CM

## 2021-09-30 DIAGNOSIS — E785 Hyperlipidemia, unspecified: Secondary | ICD-10-CM

## 2021-11-07 DIAGNOSIS — H5213 Myopia, bilateral: Secondary | ICD-10-CM | POA: Diagnosis not present

## 2021-11-07 DIAGNOSIS — H524 Presbyopia: Secondary | ICD-10-CM | POA: Diagnosis not present

## 2021-11-09 ENCOUNTER — Other Ambulatory Visit: Payer: Self-pay | Admitting: Family Medicine

## 2021-12-21 ENCOUNTER — Other Ambulatory Visit: Payer: Self-pay | Admitting: Family Medicine

## 2021-12-21 DIAGNOSIS — F339 Major depressive disorder, recurrent, unspecified: Secondary | ICD-10-CM

## 2022-01-11 DIAGNOSIS — Z79899 Other long term (current) drug therapy: Secondary | ICD-10-CM | POA: Diagnosis not present

## 2022-01-11 DIAGNOSIS — G35 Multiple sclerosis: Secondary | ICD-10-CM | POA: Diagnosis not present

## 2022-01-13 ENCOUNTER — Ambulatory Visit (INDEPENDENT_AMBULATORY_CARE_PROVIDER_SITE_OTHER): Payer: Medicare HMO | Admitting: Family Medicine

## 2022-01-13 VITALS — BP 123/55 | HR 88 | Ht 66.0 in | Wt 238.0 lb

## 2022-01-13 DIAGNOSIS — Z1329 Encounter for screening for other suspected endocrine disorder: Secondary | ICD-10-CM

## 2022-01-13 DIAGNOSIS — E785 Hyperlipidemia, unspecified: Secondary | ICD-10-CM | POA: Diagnosis not present

## 2022-01-13 DIAGNOSIS — Z23 Encounter for immunization: Secondary | ICD-10-CM

## 2022-01-13 DIAGNOSIS — F339 Major depressive disorder, recurrent, unspecified: Secondary | ICD-10-CM

## 2022-01-13 DIAGNOSIS — I10 Essential (primary) hypertension: Secondary | ICD-10-CM | POA: Diagnosis not present

## 2022-01-13 DIAGNOSIS — R7303 Prediabetes: Secondary | ICD-10-CM

## 2022-01-13 DIAGNOSIS — R7301 Impaired fasting glucose: Secondary | ICD-10-CM | POA: Diagnosis not present

## 2022-01-13 DIAGNOSIS — M25561 Pain in right knee: Secondary | ICD-10-CM | POA: Diagnosis not present

## 2022-01-13 DIAGNOSIS — D510 Vitamin B12 deficiency anemia due to intrinsic factor deficiency: Secondary | ICD-10-CM | POA: Diagnosis not present

## 2022-01-13 DIAGNOSIS — M25562 Pain in left knee: Secondary | ICD-10-CM

## 2022-01-13 DIAGNOSIS — G35 Multiple sclerosis: Secondary | ICD-10-CM

## 2022-01-13 NOTE — Assessment & Plan Note (Signed)
Discussed cutting back on junk food and trying to get more vegetables in the diet and fresh foods.

## 2022-01-13 NOTE — Progress Notes (Signed)
Established Patient Office Visit  Subjective   Patient ID: Stephanie Perkins, female    DOB: Oct 20, 1960  Age: 61 y.o. MRN: [id number]  Chief Complaint  Patient presents with   Follow-up    HPI  Hypertension- Pt denies chest pain, SOB, dizziness, or heart palpitations.  Taking meds as directed w/o problems.  Denies medication side effects.    Impaired fasting glucose-no increased thirst or urination. No symptoms consistent with hypoglycemia.  Follow-up depression-currently on Lexapro.  On Copaxone for her MS.  She has reports bilateral knee pain.  The worse is definitely the right side.  She had similar pain a couple years ago and actually some of her sports med docs.  They did x-rays which are fairly unrevealing she did have an injection in that right knee and then she got pretty significant relief.  She is done well until recently.  She is been trying to walk her dog and that is when they started to flare.  She is been taking 80 mg of Advil in the mornings as well as using ice packs she does get temporary relief with this.  She also restarted a glucosamine/turmeric supplement that she used to take and felt was helpful.  No recent injury or trauma.  Most of her pain is behind the kneecap.  Worse after she has been sitting for a while and then tries to stand up it can feel most like a sharp pain.  Not been locking or giving out.  He does have an x-ray of the right knee back in 2019 that was normal.    ROS    Objective:     BP (!) 123/55   Pulse 88   Ht 5\' 6"  (1.676 m)   Wt 238 lb (108 kg)   SpO2 96%   BMI 38.41 kg/m    Physical Exam Vitals and nursing note reviewed.  Constitutional:      Appearance: She is well-developed.  HENT:     Head: Normocephalic and atraumatic.  Cardiovascular:     Rate and Rhythm: Normal rate and regular rhythm.     Heart sounds: Normal heart sounds.  Pulmonary:     Effort: Pulmonary effort is normal.     Breath sounds: Normal breath sounds.   Skin:    General: Skin is warm and dry.  Neurological:     Mental Status: She is alert and oriented to person, place, and time.  Psychiatric:        Behavior: Behavior normal.      No results found for any visits on 01/13/22.    The 10-year ASCVD risk score (Arnett DK, et al., 2019) is: 8.4%    Assessment & Plan:   Problem List Items Addressed This Visit       Cardiovascular and Mediastinum   HYPERTENSION, BENIGN - Primary    Well controlled. Continue current regimen. Follow up in  6 mo       Relevant Orders   COMPLETE METABOLIC PANEL WITH GFR     Endocrine   IMPAIRED FASTING GLUCOSE    Due to recheck A1c.  She admits that she has not been doing the best with her diet she has been eating a lot of junk food and just really does not like vegetables.  We did discuss some strategies around that.        Nervous and Auditory   MULTIPLE SCLEROSIS    He recently had an MRI with no changes.  Which is good  news.        Other   Severe obesity (BMI 35.0-39.9) with comorbidity ([place])    Discussed cutting back on junk food and trying to get more vegetables in the diet and fresh foods.      Hyperlipidemia    Due to recheck lipids.      Relevant Orders   Lipid Panel w/reflex Direct LDL   Depression, recurrent ([place])    Content with current dose of Lexapro.  Does not want to make any changes today.      ANEMIA, PERNICIOUS   Relevant Orders   CBC with Differential/Platelet   Other Visit Diagnoses     Prediabetes       Relevant Orders   COMPLETE METABOLIC PANEL WITH GFR   Hemoglobin A1c   Thyroid disorder screen       Relevant Orders   TSH   Acute pain of both knees           Bilateral knee pain-can have given handout to do on her own at home for patellofemoral syndrome.  Her pain is usually worse after prolonged sitting and then trying to stand.  We will have her follow-up with one of our sports med docs.  Return in about 6 months (around 07/24/2022).     Beatrice Lecher, MD

## 2022-01-13 NOTE — Assessment & Plan Note (Signed)
Due to recheck A1c.  She admits that she has not been doing the best with her diet she has been eating a lot of junk food and just really does not like vegetables.  We did discuss some strategies around that.

## 2022-01-13 NOTE — Assessment & Plan Note (Signed)
Well controlled. Continue current regimen. Follow up in  6 mo  

## 2022-01-13 NOTE — Assessment & Plan Note (Signed)
Content with current dose of Lexapro.  Does not want to make any changes today.

## 2022-01-13 NOTE — Assessment & Plan Note (Signed)
He recently had an MRI with no changes.  Which is good news.

## 2022-01-13 NOTE — Assessment & Plan Note (Signed)
Due to recheck lipids. 

## 2022-01-17 DIAGNOSIS — I10 Essential (primary) hypertension: Secondary | ICD-10-CM | POA: Diagnosis not present

## 2022-01-17 DIAGNOSIS — D510 Vitamin B12 deficiency anemia due to intrinsic factor deficiency: Secondary | ICD-10-CM | POA: Diagnosis not present

## 2022-01-17 DIAGNOSIS — E785 Hyperlipidemia, unspecified: Secondary | ICD-10-CM | POA: Diagnosis not present

## 2022-01-17 DIAGNOSIS — R7303 Prediabetes: Secondary | ICD-10-CM | POA: Diagnosis not present

## 2022-01-17 DIAGNOSIS — Z1329 Encounter for screening for other suspected endocrine disorder: Secondary | ICD-10-CM | POA: Diagnosis not present

## 2022-01-18 LAB — LIPID PANEL W/REFLEX DIRECT LDL
Cholesterol: 200 mg/dL — ABNORMAL HIGH (ref <200)
HDL: 49 mg/dL — ABNORMAL LOW (ref >=50)
LDL Cholesterol (Calc): 129 mg/dL (calc) — ABNORMAL HIGH
Non-HDL Cholesterol (Calc): 151 mg/dL (calc) — ABNORMAL HIGH (ref <130)
Total CHOL/HDL Ratio: 4.1 (calc) (ref <5.0)
Triglycerides: 114 mg/dL (ref <150)

## 2022-01-18 LAB — COMPLETE METABOLIC PANEL WITH GFR
AG Ratio: 1.7 (calc) (ref 1.0–2.5)
ALT: 19 U/L (ref 6–29)
AST: 13 U/L (ref 10–35)
Albumin: 4.4 g/dL (ref 3.6–5.1)
Alkaline phosphatase (APISO): 58 U/L (ref 37–153)
BUN: 16 mg/dL (ref 7–25)
CO2: 27 mmol/L (ref 20–32)
Calcium: 9.5 mg/dL (ref 8.6–10.4)
Chloride: 104 mmol/L (ref 98–110)
Creat: 0.62 mg/dL (ref 0.50–1.05)
Globulin: 2.6 g/dL (calc) (ref 1.9–3.7)
Glucose, Bld: 124 mg/dL — ABNORMAL HIGH (ref 65–99)
Potassium: 4.1 mmol/L (ref 3.5–5.3)
Sodium: 140 mmol/L (ref 135–146)
Total Bilirubin: 0.5 mg/dL (ref 0.2–1.2)
Total Protein: 7 g/dL (ref 6.1–8.1)
eGFR: 101 mL/min/{1.73_m2} (ref >=60)

## 2022-01-18 LAB — CBC WITH DIFFERENTIAL/PLATELET
Absolute Monocytes: 689 cells/uL (ref 200–950)
Basophils Absolute: 73 cells/uL (ref 0–200)
Basophils Relative: 0.9 %
Eosinophils Absolute: 300 cells/uL (ref 15–500)
Eosinophils Relative: 3.7 %
HCT: 36.6 % (ref 35.0–45.0)
Hemoglobin: 12 g/dL (ref 11.7–15.5)
Lymphs Abs: 2608 cells/uL (ref 850–3900)
MCH: 26.8 pg — ABNORMAL LOW (ref 27.0–33.0)
MCHC: 32.8 g/dL (ref 32.0–36.0)
MCV: 81.9 fL (ref 80.0–100.0)
MPV: 10.3 fL (ref 7.5–12.5)
Monocytes Relative: 8.5 %
Neutro Abs: 4431 cells/uL (ref 1500–7800)
Neutrophils Relative %: 54.7 %
Platelets: 267 10*3/uL (ref 140–400)
RBC: 4.47 10*6/uL (ref 3.80–5.10)
RDW: 13.1 % (ref 11.0–15.0)
Total Lymphocyte: 32.2 %
WBC: 8.1 10*3/uL (ref 3.8–10.8)

## 2022-01-18 LAB — HEMOGLOBIN A1C
Hgb A1c MFr Bld: 6.9 % of total Hgb — ABNORMAL HIGH (ref <5.7)
Mean Plasma Glucose: 151 mg/dL
eAG (mmol/L): 8.4 mmol/L

## 2022-01-18 LAB — TSH: TSH: 3.33 mIU/L (ref 0.40–4.50)

## 2022-01-18 NOTE — Progress Notes (Signed)
Hi Stephanie Perkins, your LDL cholesterol is just slightly elevated from last year but still better than it was 2 years ago.  Please make sure you are taking the atorvastatin nightly.  If you need refills please let us know.  A1c went up to 6.9 which is in the diabetes range instead of prediabetes range.  We can start a medication such as metformin to help lower your A1c or if you want to work on your diet for the next 3 months and then come back and we will see how your A1c is doing before we start medication that is reasonable as you have been able to get down before.  Just let me know what you would prefer to do. Thyroid and other labs look normal.

## 2022-01-23 ENCOUNTER — Ambulatory Visit (INDEPENDENT_AMBULATORY_CARE_PROVIDER_SITE_OTHER): Payer: Medicare HMO

## 2022-01-23 ENCOUNTER — Ambulatory Visit (INDEPENDENT_AMBULATORY_CARE_PROVIDER_SITE_OTHER): Payer: Medicare HMO | Admitting: Sports Medicine

## 2022-01-23 DIAGNOSIS — M25562 Pain in left knee: Secondary | ICD-10-CM | POA: Diagnosis not present

## 2022-01-23 DIAGNOSIS — M1711 Unilateral primary osteoarthritis, right knee: Secondary | ICD-10-CM

## 2022-01-23 DIAGNOSIS — Z09 Encounter for follow-up examination after completed treatment for conditions other than malignant neoplasm: Secondary | ICD-10-CM | POA: Diagnosis not present

## 2022-01-23 DIAGNOSIS — M47816 Spondylosis without myelopathy or radiculopathy, lumbar region: Secondary | ICD-10-CM | POA: Diagnosis not present

## 2022-01-23 DIAGNOSIS — M25561 Pain in right knee: Secondary | ICD-10-CM | POA: Diagnosis not present

## 2022-01-23 MED ORDER — MELOXICAM 15 MG PO TABS: ORAL_TABLET | ORAL | 3 refills | Status: DC

## 2022-01-23 NOTE — Assessment & Plan Note (Signed)
Stephanie Perkins also has chronic axial low back pain, worse with walking. Suspect spondylitic processes. Adding home physical therapy, x-rays, meloxicam as above. Return to see me in 6 weeks, MR if no better.

## 2022-01-23 NOTE — Assessment & Plan Note (Signed)
Chronic right knee pain, previous x-rays showed minimal chondromalacia medial femoral condyle on my personal review. Had a previous injection maybe 4 years ago and did pretty well. She has tried some ibuprofen with only minimal efficacy, I explained the likelihood of osteoarthritis as well as the treatment approach. We will get updated x-rays, at home physical therapy, meloxicam. Return to see me in 6 weeks, injection if not better.

## 2022-01-23 NOTE — Progress Notes (Signed)
    Procedures performed today:    None.  Independent interpretation of notes and tests performed by another provider:   None.  Brief History, Exam, Impression, and Recommendations:    Primary osteoarthritis of right knee Chronic right knee pain, previous x-rays showed minimal chondromalacia medial femoral condyle on my personal review. Had a previous injection maybe 4 years ago and did pretty well. She has tried some ibuprofen with only minimal efficacy, I explained the likelihood of osteoarthritis as well as the treatment approach. We will get updated x-rays, at home physical therapy, meloxicam. Return to see me in 6 weeks, injection if not better.  Lumbar spondylosis Ardyce also has chronic axial low back pain, worse with walking. Suspect spondylitic processes. Adding home physical therapy, x-rays, meloxicam as above. Return to see me in 6 weeks, MR if no better.  Chronic process with exacerbation and pharmacologic intervention  ____________________________________________ Gwen Her. Dianah Field, M.D., ABFM., CAQSM., AME. Primary Care and Sports Medicine  [facility]  Adjunct Professor of [place] [employer] [facility] of Medicine  Risk manager

## 2022-01-24 ENCOUNTER — Encounter: Payer: Self-pay | Admitting: Sports Medicine

## 2022-01-27 ENCOUNTER — Ambulatory Visit (INDEPENDENT_AMBULATORY_CARE_PROVIDER_SITE_OTHER): Payer: Medicare HMO | Admitting: Physician Assistant

## 2022-01-27 DIAGNOSIS — Z0000 Encounter for general adult medical examination without abnormal findings: Secondary | ICD-10-CM | POA: Diagnosis not present

## 2022-01-27 NOTE — Patient Instructions (Signed)
MEDICARE ANNUAL WELLNESS VISIT Health Maintenance Summary and Written Plan of Care  Stephanie Perkins ,  Thank you for allowing me to perform your Medicare Annual Wellness Visit and for your ongoing commitment to your health.   Health Maintenance & Immunization History Health Maintenance  Topic Date Due   COVID-19 Vaccine (4 - Pfizer risk series) 02/12/2022 (Originally 03/16/2021)   Pneumococcal Vaccine 22-62 Years old (1 - PCV) 02/28/2022 (Originally 04/02/1966)   TETANUS/TDAP  02/28/2022 (Originally 01/29/2021)   Medicare Annual Wellness (AWV)  01/28/2023   MAMMOGRAM  05/12/2023   Fecal DNA (Cologuard)  10/21/2023   INFLUENZA VACCINE  Completed   Hepatitis C Screening  Completed   HIV Screening  Completed   Zoster Vaccines- Shingrix  Completed   HPV VACCINES  Aged Out   COLONOSCOPY (Pts 45-19yrs Insurance coverage will need to be confirmed)  Discontinued   Immunization History  Administered Date(s) Administered   Influenza,inj,Quad PF,6+ Mos 03/13/2018, 01/13/2019, 04/15/2020, 01/13/2021, 01/13/2022   Influenza-Unspecified 01/21/2013, 03/13/2018, 01/13/2019   PFIZER(Purple Top)SARS-COV-2 Vaccination 06/27/2019, 07/22/2019, 01/19/2021   Zoster Recombinat (Shingrix) 02/12/2021, 04/20/2021    These are the patient goals that we discussed:  Goals Addressed               This Visit's Progress     Patient Stated (pt-stated)        Patient stated that she would like to loose 30 lbs.         This is a list of Health Maintenance Items that are overdue or due now: There are no preventive care reminders to display for this patient.    Orders/Referrals Placed Today: No orders of the defined types were placed in this encounter.  (Contact our referral department at [phone] if you have not spoken with someone about your referral appointment within the next 5 days)    Follow-up Plan Follow-up with Agapito Games, MD as planned Medicare wellness visit in one  year. Patient will access AVS on my chart.      Health Maintenance, Female Adopting a healthy lifestyle and getting preventive care are important in promoting health and wellness. Ask your health care provider about: The right schedule for you to have regular tests and exams. Things you can do on your own to prevent diseases and keep yourself healthy. What should I know about diet, weight, and exercise? Eat a healthy diet  Eat a diet that includes plenty of vegetables, fruits, low-fat dairy products, and lean protein. Do not eat a lot of foods that are high in solid fats, added sugars, or sodium. Maintain a healthy weight Body mass index (BMI) is used to identify weight problems. It estimates body fat based on height and weight. Your health care provider can help determine your BMI and help you achieve or maintain a healthy weight. Get regular exercise Get regular exercise. This is one of the most important things you can do for your health. Most adults should: Exercise for at least 150 minutes each week. The exercise should increase your heart rate and make you sweat (moderate-intensity exercise). Do strengthening exercises at least twice a week. This is in addition to the moderate-intensity exercise. Spend less time sitting. Even light physical activity can be beneficial. Watch cholesterol and blood lipids Have your blood tested for lipids and cholesterol at 62 years of age, then have this test every 5 years. Have your cholesterol levels checked more often if: Your lipid or cholesterol levels are high. You are older than 62  years of age. You are at high risk for heart disease. What should I know about cancer screening? Depending on your health history and family history, you may need to have cancer screening at various ages. This may include screening for: Breast cancer. Cervical cancer. Colorectal cancer. Skin cancer. Lung cancer. What should I know about heart disease,  diabetes, and high blood pressure? Blood pressure and heart disease High blood pressure causes heart disease and increases the risk of stroke. This is more likely to develop in people who have high blood pressure readings or are overweight. Have your blood pressure checked: Every 3-5 years if you are 66-1 years of age. Every year if you are 60 years old or older. Diabetes Have regular diabetes screenings. This checks your fasting blood sugar level. Have the screening done: Once every three years after age 75 if you are at a normal weight and have a low risk for diabetes. More often and at a younger age if you are overweight or have a high risk for diabetes. What should I know about preventing infection? Hepatitis B If you have a higher risk for hepatitis B, you should be screened for this virus. Talk with your health care provider to find out if you are at risk for hepatitis B infection. Hepatitis C Testing is recommended for: Everyone born from [birth year] through 1966. Anyone with known risk factors for hepatitis C. Sexually transmitted infections (STIs) Get screened for STIs, including gonorrhea and chlamydia, if: You are sexually active and are younger than 62 years of age. You are older than 62 years of age and your health care provider tells you that you are at risk for this type of infection. Your sexual activity has changed since you were last screened, and you are at increased risk for chlamydia or gonorrhea. Ask your health care provider if you are at risk. Ask your health care provider about whether you are at high risk for HIV. Your health care provider may recommend a prescription medicine to help prevent HIV infection. If you choose to take medicine to prevent HIV, you should first get tested for HIV. You should then be tested every 3 months for as long as you are taking the medicine. Pregnancy If you are about to stop having your period (premenopausal) and you may become pregnant,  seek counseling before you get pregnant. Take 400 to 800 micrograms (mcg) of folic acid every day if you become pregnant. Ask for birth control (contraception) if you want to prevent pregnancy. Osteoporosis and menopause Osteoporosis is a disease in which the bones lose minerals and strength with aging. This can result in bone fractures. If you are 51 years old or older, or if you are at risk for osteoporosis and fractures, ask your health care provider if you should: Be screened for bone loss. Take a calcium or vitamin D supplement to lower your risk of fractures. Be given hormone replacement therapy (HRT) to treat symptoms of menopause. Follow these instructions at home: Alcohol use Do not drink alcohol if: Your health care provider tells you not to drink. You are pregnant, may be pregnant, or are planning to become pregnant. If you drink alcohol: Limit how much you have to: 0-1 drink a day. Know how much alcohol is in your drink. In the [place] one drink equals one 12 oz bottle of beer (355 mL), one 5 oz glass of wine (148 mL), or one 1 oz glass of hard liquor (44 mL). Lifestyle Do not use  any products that contain nicotine or tobacco. These products include cigarettes, chewing tobacco, and vaping devices, such as e-cigarettes. If you need help quitting, ask your health care provider. Do not use street drugs. Do not share needles. Ask your health care provider for help if you need support or information about quitting drugs. General instructions Schedule regular health, dental, and eye exams. Stay current with your vaccines. Tell your health care provider if: You often feel depressed. You have ever been abused or do not feel safe at home. Summary Adopting a healthy lifestyle and getting preventive care are important in promoting health and wellness. Follow your health care provider's instructions about healthy diet, exercising, and getting tested or screened for diseases. Follow your  health care provider's instructions on monitoring your cholesterol and blood pressure. This information is not intended to replace advice given to you by your health care provider. Make sure you discuss any questions you have with your health care provider. Document Revised: 08/09/2020 Document Reviewed: 08/09/2020 Elsevier Patient Education  2023 [employer].

## 2022-01-27 NOTE — Progress Notes (Signed)
MEDICARE ANNUAL WELLNESS VISIT  01/27/2022  Telephone Visit Disclaimer This Medicare AWV was conducted by telephone due to national recommendations for restrictions regarding the COVID-19 Pandemic (e.g. social distancing).  I verified, using two identifiers, that I am speaking with Stephanie Perkins or their authorized healthcare agent. I discussed the limitations, risks, security, and privacy concerns of performing an evaluation and management service by telephone and the potential availability of an in-person appointment in the future. The patient expressed understanding and agreed to proceed.  Location of Patient: Home Location of Provider (nurse):  In the office.  Subjective:    Stephanie Perkins is a 61 y.o. female patient of Metheney, Rene Kocher, MD who had a Medicare Annual Wellness Visit today via telephone. Stephanie Perkins is Disabled and lives with her son. she has 1 child. she reports that she is socially active and does interact with friends/family regularly. she is minimally physically active and enjoys watching television.  Patient Care Team: Hali Marry, MD as PCP - General Arvella Nigh, MD as Consulting Physician (Obstetrics and Gynecology) Ellwood Dense., MD as Referring Physician (Neurology) Darius Bump, [facility] as Pharmacist (Pharmacist)     01/27/2022    2:56 PM 01/03/2021   10:13 AM  Advanced Directives  Does Patient Have a Medical Advance Directive? No No  Would patient like information on creating a medical advance directive? No - Patient declined No - Patient declined    Hospital Utilization Over the Past 12 Months: # of hospitalizations or ER visits: 0 # of surgeries: 0  Review of Systems    Patient reports that her overall health is unchanged compared to last year.  History obtained from chart review and the patient  Patient Reported Readings (BP, Pulse, CBG, Weight, etc) none  Pain Assessment Pain : 0-10 Pain Score: 7  Pain Type: Acute  pain Pain Location: Generalized Pain Descriptors / Indicators: Constant Pain Onset: More than a month ago Pain Frequency: Constant Pain Relieving Factors: Stretching  Pain Relieving Factors: Stretching  Current Medications & Allergies (verified) Allergies as of 01/27/2022       Reactions   Crestor [rosuvastatin] Other (See Comments)   FAtigue and myalgia   Januvia [sitagliptin] Swelling   Metformin And Related Other (See Comments)   Diarrrhea   Ocrevus [ocrelizumab] Diarrhea   Severed diarrhea   Penicillins    REACTION: hives   Zetia [ezetimibe] Other (See Comments)   Extreme fatigue        Medication List        Accurate as of January 27, 2022  3:03 PM. If you have any questions, ask your nurse or doctor.          atorvastatin 80 MG tablet Commonly known as: LIPITOR TAKE 1 TABLET AT BEDTIME   blood glucose meter kit and supplies Dispense based on patient and insurance preference. Use to check blood sugars once daily. DX: R73.03   Copaxone 40 MG/ML Sosy Generic drug: Glatiramer Acetate Inject 40 mg into the skin 3 (three) times a week.   cyanocobalamin 1000 MCG/ML injection Commonly known as: VITAMIN B12 INJECT 1 ML INTO THE MUSCLE EVERY 21 DAYS.   escitalopram 10 MG tablet Commonly known as: LEXAPRO TAKE 1 TABLET EVERY DAY   glucosamine-chondroitin 500-400 MG tablet Take 1 tablet by mouth daily.   meloxicam 15 MG tablet Commonly known as: MOBIC One tab PO every 24 hours with a meal for 2 weeks, then once every 24 hours prn pain.  modafinil 200 MG tablet Commonly known as: PROVIGIL TAKE 1-2 TABLETS BY MOUTH AS NEEDED FOR FAUTIGUE   Olmesartan-amLODIPine-HCTZ 40-10-25 MG Tabs TAKE 1 TABLET EVERY DAY   True Metrix Blood Glucose Test test strip Generic drug: glucose blood   TRUEplus Lancets 33G Misc   Vitamin D (Ergocalciferol) 1.25 MG (50000 UNIT) Caps capsule Commonly known as: DRISDOL Take 50,000 Units by mouth 2 (two) times a week.  Taking Saturdays & Wednesdays        History (reviewed): Past Medical History:  Diagnosis Date   Allergy 1965   Took allergy shots until 61 years old. I have had allergic reactions to medication etc   Anxiety ?   Depression    Hyperlipidemia    Hypertension    Multiple sclerosis ([place])    Neuromuscular disorder ([place]) 1998   MS   Vision abnormalities    Past Surgical History:  Procedure Laterality Date   ABDOMINAL HYSTERECTOMY  2008   APPENDECTOMY  04/1997   CESAREAN SECTION  12/1983   TOTAL VAGINAL HYSTERECTOMY  03/2007   for fibroids, Dr. Radene Knee   TUBAL LIGATION  2002   Family History  Problem Relation Age of Onset   Hyperlipidemia Mother    Hypertension Mother    Alcohol abuse Maternal Grandfather    Cancer Maternal Grandfather    ADD / ADHD Son    Social History   Socioeconomic History   Marital status: Divorced    Spouse name: Not on file   Number of children: Not on file   Years of education: 16   Highest education level: Bachelor's degree (e.g., BA, AB, BS)  Occupational History   Occupation: Disabled  Tobacco Use   Smoking status: Every Day    Packs/day: 1.00    Years: 3.00    Total pack years: 3.00    Types: Cigarettes   Smokeless tobacco: Never   Tobacco comments:    Patient smokes 1 pack a day   Vaping Use   Vaping Use: Never used  Substance and Sexual Activity   Alcohol use: Yes    Alcohol/week: 7.0 standard drinks of alcohol    Types: 7 Standard drinks or equivalent per week   Drug use: No   Sexual activity: Not Currently    Birth control/protection: Surgical  Other Topics Concern   Not on file  Social History Narrative   Lives alone. She has one son. She enjoys taking care of her cats and dogs. She does like to watch television.   Social Determinants of Health   Financial Resource Strain: Medium Risk (01/19/2022)   Overall Financial Resource Strain (CARDIA)    Difficulty of Paying Living Expenses: Somewhat hard  Food Insecurity:  No Food Insecurity (01/19/2022)   Hunger Vital Sign    Worried About Running Out of Food in the Last Year: Never true    Ran Out of Food in the Last Year: Never true  Transportation Needs: No Transportation Needs (01/19/2022)   PRAPARE - Hydrologist (Medical): No    Lack of Transportation (Non-Medical): No  Physical Activity: Inactive (01/19/2022)   Exercise Vital Sign    Days of Exercise per Week: 0 days    Minutes of Exercise per Session: 0 min  Stress: No Stress Concern Present (01/19/2022)   [place]    Feeling of Stress : Only a little  Social Connections: Socially Isolated (01/27/2022)   Social Connection and Isolation Panel [  NHANES]    Frequency of Communication with Friends and Family: Once a week    Frequency of Social Gatherings with Friends and Family: Once a week    Attends Religious Services: Never    Marine scientist or Organizations: No    Attends Archivist Meetings: Never    Marital Status: Divorced    Activities of Daily Living    01/19/2022    1:38 PM  In your present state of health, do you have any difficulty performing the following activities:  Hearing? 0   0  Vision? 0   0  Difficulty concentrating or making decisions? 1   1  Walking or climbing stairs? 1   1  Dressing or bathing? 0   0  Doing errands, shopping? 0   0  Preparing Food and eating ? N   N  Using the Toilet? N   N  In the past six months, have you accidently leaked urine? Y   Y  Do you have problems with loss of bowel control? N   N  Managing your Medications? N   N  Managing your Finances? N   N  Housekeeping or managing your Housekeeping? N   N    Patient Education/ Literacy How often do you need to have someone help you when you read instructions, pamphlets, or other written materials from your doctor or pharmacy?: 1 - Never What is the last grade level you  completed in school?: Bachelor's degree  Exercise Current Exercise Habits: The patient does not participate in regular exercise at present, Exercise limited by: orthopedic condition(s)  Diet Patient reports consuming 3 meals a day and 2 snack(s) a day Patient reports that her primary diet is: Regular Patient reports that she does have regular access to food.   Depression Screen    01/27/2022    2:56 PM 01/13/2022    2:10 PM 07/14/2021    2:29 PM 04/15/2021    2:14 PM 01/13/2021   10:47 AM 01/03/2021   10:13 AM 10/13/2020   11:03 AM  PHQ 2/9 Scores  PHQ - 2 Score 0 0 _0 PHQ- 9 Score  _1 Fall Risk    01/27/2022    2:56 PM 01/19/2022    1:38 PM 01/13/2022    2:10 PM 07/14/2021    2:08 PM 04/15/2021    1:34 PM  Fall Risk   Falls in the past year? _2 0 0  Number falls in past yr: _3 0 0  Injury with Fall? 0 0   0 0 0 0  Risk for fall due to : History of fall(s);Impaired balance/gait  History of fall(s) No Fall Risks No Fall Risks  Follow up Falls evaluation completed;Education provided;Falls prevention discussed  Falls evaluation completed Falls prevention discussed;Falls evaluation completed Falls evaluation completed;Falls prevention discussed     Objective:  Stephanie Perkins seemed alert and oriented and she participated appropriately during our telephone visit.  Blood Pressure Weight BMI  BP Readings from Last 3 Encounters:  01/13/22 (!) 123/55  07/14/21 117/61  04/15/21 135/69   Wt Readings from Last 3 Encounters:  01/13/22 238 lb (108 kg)  07/14/21 225 lb (102.1 kg)  04/15/21 228 lb (103.4 kg)   BMI Readings from Last 1 Encounters:  01/13/22 38.41 kg/m    *Unable to obtain current vital signs,  weight, and BMI due to telephone visit type  Hearing/Vision  Derra did not seem to have difficulty with hearing/understanding during the telephone conversation Reports that she has had a formal eye exam by an eye care professional  within the past year Reports that she has not had a formal hearing evaluation within the past year *Unable to fully assess hearing and vision during telephone visit type  Cognitive Function:    01/27/2022    3:00 PM 01/03/2021   10:28 AM  6CIT Screen  What Year? 0 points 0 points  What month? 0 points 0 points  What time? 0 points 0 points  Count back from 20 0 points 0 points  Months in reverse 0 points 0 points  Repeat phrase 2 points 0 points  Total Score 2 points 0 points   (Normal:0-7, Significant for Dysfunction: >8)  Normal Cognitive Function Screening: Yes   Immunization & Health Maintenance Record Immunization History  Administered Date(s) Administered   Influenza,inj,Quad PF,6+ Mos 03/13/2018, 01/13/2019, 04/15/2020, 01/13/2021, 01/13/2022   Influenza-Unspecified 01/21/2013, 03/13/2018, 01/13/2019   PFIZER(Purple Top)SARS-COV-2 Vaccination 06/27/2019, 07/22/2019, 01/19/2021   Zoster Recombinat (Shingrix) 02/12/2021, 04/20/2021    Health Maintenance  Topic Date Due   COVID-19 Vaccine (4 - Pfizer risk series) 02/12/2022 (Originally 03/16/2021)   Pneumococcal Vaccine 73-76 Years old (1 - PCV) 02/28/2022 (Originally 04/02/1967)   TETANUS/TDAP  02/28/2022 (Originally 01/29/2021)   Medicare Annual Wellness (AWV)  01/28/2023   MAMMOGRAM  05/12/2023   Fecal DNA (Cologuard)  10/21/2023   INFLUENZA VACCINE  Completed   Hepatitis C Screening  Completed   HIV Screening  Completed   Zoster Vaccines- Shingrix  Completed   HPV VACCINES  Aged Out   COLONOSCOPY (Pts 45-16yr Insurance coverage will need to be confirmed)  Discontinued       Assessment  This is a routine wellness examination for [employer]  Health Maintenance: Due or Overdue There are no preventive care reminders to display for this patient.   LAntony Blackbirddoes not need a referral for Community Assistance: Care Management:   no Social Work:    no Prescription Assistance:  no Nutrition/Diabetes  Education:  no   Plan:  Personalized Goals  Goals Addressed               This Visit's Progress     Patient Stated (pt-stated)        Patient stated that she would like to loose 30 lbs.       Personalized Health Maintenance & Screening Recommendations  Pneumococcal vaccine  Td vaccine  Patient declined the vaccines.  Lung Cancer Screening Recommended: no (Low Dose CT Chest recommended if Age 62-80years, 30 pack-year currently smoking OR have quit w/in past 15 years) Hepatitis C Screening recommended: no HIV Screening recommended: no  Advanced Directives: Written information was not prepared per patient's request.  Referrals & Orders No orders of the defined types were placed in this encounter.   Follow-up Plan Follow-up with MHali Marry MD as planned Medicare wellness visit in one year. Patient will access AVS on my chart.   I have personally reviewed and noted the following in the patient's chart:   Medical and social history Use of alcohol, tobacco or illicit drugs  Current medications and supplements Functional ability and status Nutritional status Physical activity Advanced directives List of other physicians Hospitalizations, surgeries, and ER visits in previous 12 months Vitals Screenings to include cognitive, depression, and falls Referrals and appointments  In  addition, I have reviewed and discussed with Stephanie Perkins certain preventive protocols, quality metrics, and best practice recommendations. A written personalized care plan for preventive services as well as general preventive health recommendations is available and can be mailed to the patient at her request.      Tinnie Gens, RN BSN  01/27/2022

## 2022-01-30 DIAGNOSIS — E559 Vitamin D deficiency, unspecified: Secondary | ICD-10-CM | POA: Diagnosis not present

## 2022-01-30 DIAGNOSIS — Z79899 Other long term (current) drug therapy: Secondary | ICD-10-CM | POA: Diagnosis not present

## 2022-01-30 DIAGNOSIS — G35 Multiple sclerosis: Secondary | ICD-10-CM | POA: Diagnosis not present

## 2022-03-06 ENCOUNTER — Ambulatory Visit (INDEPENDENT_AMBULATORY_CARE_PROVIDER_SITE_OTHER): Payer: Medicare HMO | Admitting: Sports Medicine

## 2022-03-06 DIAGNOSIS — M47816 Spondylosis without myelopathy or radiculopathy, lumbar region: Secondary | ICD-10-CM

## 2022-03-06 DIAGNOSIS — M1711 Unilateral primary osteoarthritis, right knee: Secondary | ICD-10-CM

## 2022-03-06 MED ORDER — ACETAMINOPHEN ER 650 MG PO TBCR: 650.0000 mg | EXTENDED_RELEASE_TABLET | Freq: Three times a day (TID) | ORAL | 3 refills | Status: DC | PRN

## 2022-03-06 NOTE — Assessment & Plan Note (Signed)
Stephanie Perkins returns, she is doing a good amount better with meloxicam, we will add arthritis strength Tylenol. 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 Continue home conditioning, return as needed for this. No injection needed, of note she did have an injection in the distant past maybe 4 years ago that did well.

## 2022-03-06 NOTE — Assessment & Plan Note (Signed)
Continue to have chronic axial low back pain, much better with meloxicam, adding arthritis from Tylenol and advanced herniated disc conditioning, return as needed.

## 2022-03-06 NOTE — Progress Notes (Signed)
    Procedures performed today:    None.  Independent interpretation of notes and tests performed by another provider:   None.  Brief History, Exam, Impression, and Recommendations:    Primary osteoarthritis of right knee Stephanie Perkins returns, she is doing a good amount better with meloxicam, we will add arthritis strength Tylenol. 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 Continue home conditioning, return as needed for this. No injection needed, of note she did have an injection in the distant past maybe 4 years ago that did well.  Lumbar spondylosis Continue to have chronic axial low back pain, much better with meloxicam, adding arthritis from Tylenol and advanced herniated disc conditioning, return as needed.    ____________________________________________ Ihor Austin. Benjamin Stain, M.D., ABFM., CAQSM., AME. Primary Care and Sports Medicine [facility]  Adjunct Professor of Family Medicine  [place] [employer]  Restaurant manager, fast food

## 2022-03-10 ENCOUNTER — Ambulatory Visit (INDEPENDENT_AMBULATORY_CARE_PROVIDER_SITE_OTHER): Payer: Medicare HMO | Admitting: Pharmacist

## 2022-03-10 DIAGNOSIS — I10 Essential (primary) hypertension: Secondary | ICD-10-CM

## 2022-03-10 DIAGNOSIS — R7303 Prediabetes: Secondary | ICD-10-CM

## 2022-03-10 DIAGNOSIS — E785 Hyperlipidemia, unspecified: Secondary | ICD-10-CM

## 2022-03-10 NOTE — Progress Notes (Signed)
 Chronic Care Management Pharmacy Note  03/10/2022 Name:  Stephanie Perkins MRN:  [id number] DOB:  02/05/1961  Summary: addressed DM, HTN, HLD.  Since our last discussion, her A1c has progressed into diabetes (previously in pre-DM range). She decided to trial lifestyle management changes first, before starting medication. Patient reports she has lost 8lbs and is trying lower carb diet.   She got a puppy in March, and has been enjoying walking the dog but notes to have knee pain with more activity. She is working with Dr. T on pain relief and symptom management.   Recommendations/Changes made from today's visit: No changes at this time, provided extensive counseling about diabetes, nutrition, and rybelsus as a first line treatment option. Reviewed possible new medication as well as options to ensure affordability (sample box to start, then patient assistance if medication is cost-prohibitive through insurance).  Plan: f/u with pharmacist in 1 month  Subjective: Stephanie Perkins is an 60 y.o. year old female who is a primary patient of Metheney, Catherine D, MD.  The CCM team was consulted for assistance with disease management and care coordination needs.    Engaged with patient by telephone for follow up visit in response to provider referral for pharmacy case management and/or care coordination services.   Consent to Services:  The patient was given information about Chronic Care Management services, agreed to services, and gave verbal consent prior to initiation of services.  Please see initial visit note for detailed documentation.   Patient Care Team: Metheney, Catherine D, MD as PCP - General McComb, John, MD as Consulting Physician (Obstetrics and Gynecology) Meyer, David D., MD as Referring Physician (Neurology) Kline, Keesha J, RPH as Pharmacist (Pharmacist)   Objective:  Lab Results  Component Value Date   CREATININE 0.62 01/17/2022   CREATININE 0.64 04/15/2021   CREATININE  0.65 10/14/2020    Lab Results  Component Value Date   HGBA1C 6.9 (H) 01/17/2022       Component Value Date/Time   CHOL 200 (H) 01/17/2022 0000   TRIG 114 01/17/2022 0000   HDL 49 (L) 01/17/2022 0000   CHOLHDL 4.1 01/17/2022 0000   VLDL 38 (H) 12/22/2015 1101   LDLCALC 129 (H) 01/17/2022 0000       Latest Ref Rng & Units 01/17/2022   12:00 AM 10/14/2020    9:29 AM 10/21/2019    9:16 AM  Hepatic Function  Total Protein 6.1 - 8.1 g/dL 7.0  7.1  6.6   AST 10 - 35 U/L 13  18  15   ALT 6 - 29 U/L 19  19  17   Total Bilirubin 0.2 - 1.2 mg/dL 0.5  0.7  0.6     Lab Results  Component Value Date/Time   TSH 3.33 01/17/2022 12:00 AM   TSH 3.98 01/18/2018 09:17 AM       Latest Ref Rng & Units 01/17/2022   12:00 AM 06/02/2015   11:23 AM 02/11/2013    9:21 AM  CBC  WBC 3.8 - 10.8 Thousand/uL 8.1  8.8  6.7   Hemoglobin 11.7 - 15.5 g/dL 12.0  13.4  14.4   Hematocrit 35.0 - 45.0 % 36.6  40.9  42.6   Platelets 140 - 400 Thousand/uL 267  352  299     Clinical ASCVD: No  The 10-year ASCVD risk score (Arnett DK, et al., 2019) is: 8.6%   Values used to calculate the score:     Age: 60 years       Sex: Female     Is Non-Hispanic African American: No     Diabetic: No     Tobacco smoker: Yes     Systolic Blood Pressure: 133 mmHg     Is BP treated: No     HDL Cholesterol: 49 mg/dL     Total Cholesterol: 200 mg/dL     Social History   Tobacco Use  Smoking Status Every Day   Packs/day: 1.00   Years: 3.00   Total pack years: 3.00   Types: Cigarettes  Smokeless Tobacco Never  Tobacco Comments   Patient smokes 1 pack a day    BP Readings from Last 3 Encounters:  01/13/22 (!) 123/55  07/14/21 117/61  04/15/21 135/69   Pulse Readings from Last 3 Encounters:  01/13/22 88  07/14/21 82  04/15/21 83   Wt Readings from Last 3 Encounters:  01/13/22 238 lb (108 kg)  07/14/21 225 lb (102.1 kg)  04/15/21 228 lb (103.4 kg)    Assessment: Review of patient past medical  history, allergies, medications, health status, including review of consultants reports, laboratory and other test data, was performed as part of comprehensive evaluation and provision of chronic care management services.   SDOH:  (Social Determinants of Health) assessments and interventions performed:  SDOH Interventions    Flowsheet Row Office Visit from 01/27/2022 in [facility] Office Visit from 01/13/2022 in [facility] Office Visit from 01/13/2021 in [facility] Office Visit from 01/03/2021 in [facility] Office Visit from 10/13/2020 in [facility] Chronic Care Management from 08/25/2020 in [facility]  SDOH Interventions        Food Insecurity Interventions -- -- -- Intervention Not Indicated -- --  Housing Interventions -- -- -- Intervention Not Indicated -- --  Transportation Interventions -- -- -- Intervention Not Indicated -- --  Depression Interventions/Treatment  -- Counseling Currently on Treatment Currently on Treatment Currently on Treatment --  Financial Strain Interventions Patient Refused -- -- -- -- Intervention Not Indicated  Physical Activity Interventions -- -- -- Intervention Not Indicated -- --  Stress Interventions -- -- -- Intervention Not Indicated -- --  Social Connections Interventions -- -- -- Patient Refused -- --       CCM Care Plan  Allergies  Allergen Reactions   Crestor [Rosuvastatin] Other (See Comments)    FAtigue and myalgia   Januvia [Sitagliptin] Swelling   Metformin And Related Other (See Comments)    Diarrrhea   Ocrevus [Ocrelizumab] Diarrhea    Severed diarrhea   Penicillins     REACTION: hives   Zetia [Ezetimibe] Other (See Comments)    Extreme fatigue    Medications Reviewed Today     Reviewed by Gipson, Nicholas, CMA  (Certified Medical Assistant) on 03/06/22 at 1108  Med List Status: <None>   Medication Order Taking? Sig Documenting Provider Last Dose Status Informant  atorvastatin (LIPITOR) 80 MG tablet [id number] Yes TAKE 1 TABLET AT BEDTIME Metheney, Catherine D, MD Taking Active   blood glucose meter kit and supplies [id number] Yes Dispense based on patient and insurance preference. Use to check blood sugars once daily. DX: R73.03 Metheney, Catherine D, MD Taking Active   COPAXONE 40 MG/ML SOSY [id number] Yes Inject 40 mg into the skin 3 (three) times a week. [provider] Taking Active              Med Note (KLINE, KEESHA J   Wed Aug 25, 2020 11:16 AM) 40mg SQ injection 3x per week, MWF per patient & neurology  cyanocobalamin (VITAMIN B12) 1000 MCG/ML injection [id number] Yes INJECT 1 ML INTO THE MUSCLE EVERY 21 DAYS. Metheney, Catherine D, MD Taking Active   escitalopram (LEXAPRO) 10 MG tablet [id number] Yes TAKE 1 TABLET EVERY DAY Metheney, Catherine D, MD Taking Active   glucosamine-chondroitin 500-400 MG tablet [id number]  Take 1 tablet by mouth daily. [provider]  Active   meloxicam (MOBIC) 15 MG tablet [id number] Yes One tab PO every 24 hours with a meal for 2 weeks, then once every 24 hours prn pain. Thekkekandam, Thomas J, MD Taking Active   modafinil (PROVIGIL) 200 MG tablet [id number] Yes TAKE 1-2 TABLETS BY MOUTH AS NEEDED FOR FAUTIGUE [provider] Taking Active            Med Note (BARKLEY, TONYA L   Wed Oct 15, 2019 11:18 AM)    Olmesartan-amLODIPine-HCTZ 40-10-25 MG TABS [id number] Yes TAKE 1 TABLET EVERY DAY Metheney, Catherine D, MD Taking Active   TRUE METRIX BLOOD GLUCOSE TEST test strip [id number] Yes  [provider] Taking Active   TRUEplus Lancets 33G MISC [id number] Yes  [provider] Taking Active   Vitamin D, Ergocalciferol, (DRISDOL) 1.25 MG (50000 UNIT) CAPS capsule [id number] Yes Take 50,000 Units by mouth 2 (two) times a week. 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   Encounter for therapeutic drug level monitoring 05/17/2015   Fatigue 05/17/2015   Encounter for monitoring immunomodulating therapy 05/17/2015   Lactose intolerance 12/01/2011   ANEMIA, PERNICIOUS 12/23/2009   IMPAIRED FASTING GLUCOSE 05/21/2009   MULTIPLE SCLEROSIS 11/22/2007   Hyperlipidemia 09/25/2007   Depression, recurrent (HCC) 09/25/2007   HYPERTENSION, BENIGN 09/25/2007    Immunization History  Administered Date(s) Administered   Influenza,inj,Quad PF,6+ Mos 03/13/2018, 01/13/2019, 04/15/2020, 01/13/2021, 01/13/2022   Influenza-Unspecified 01/21/2013, 03/13/2018, 01/13/2019   PFIZER(Purple Top)SARS-COV-2 Vaccination 06/27/2019, 07/22/2019, 01/19/2021   Zoster Recombinat (Shingrix) 02/12/2021, 04/20/2021    Conditions to be addressed/monitored: HTN, HLD, and DM  Medication Assistance: None required.  Patient affirms current coverage meets needs.  Patient's preferred pharmacy is:  [facility] - [place], [place] - [address] [place] [address] Phone: [phone] Fax: [phone]    Follow Up:  Patient agrees to Care Plan and Follow-up.  Plan: Telephone follow up appointment with care management team member scheduled for:  1 month  Keesha Kline, PharmD Clinical Pharmacist [facility] [phone]  

## 2022-04-17 ENCOUNTER — Encounter: Payer: Self-pay | Admitting: Family Medicine

## 2022-04-17 ENCOUNTER — Ambulatory Visit (INDEPENDENT_AMBULATORY_CARE_PROVIDER_SITE_OTHER): Payer: Medicare HMO | Admitting: Family Medicine

## 2022-04-17 VITALS — BP 126/63 | HR 82 | Ht 66.0 in | Wt 224.0 lb

## 2022-04-17 DIAGNOSIS — F339 Major depressive disorder, recurrent, unspecified: Secondary | ICD-10-CM | POA: Diagnosis not present

## 2022-04-17 DIAGNOSIS — I10 Essential (primary) hypertension: Secondary | ICD-10-CM | POA: Diagnosis not present

## 2022-04-17 DIAGNOSIS — E118 Type 2 diabetes mellitus with unspecified complications: Secondary | ICD-10-CM

## 2022-04-17 DIAGNOSIS — G35 Multiple sclerosis: Secondary | ICD-10-CM | POA: Diagnosis not present

## 2022-04-17 DIAGNOSIS — E785 Hyperlipidemia, unspecified: Secondary | ICD-10-CM | POA: Diagnosis not present

## 2022-04-17 DIAGNOSIS — R7303 Prediabetes: Secondary | ICD-10-CM | POA: Diagnosis not present

## 2022-04-17 LAB — POCT GLYCOSYLATED HEMOGLOBIN (HGB A1C): Hemoglobin A1C: 7.1 % — AB (ref 4.0–5.6)

## 2022-04-17 MED ORDER — OLMESARTAN-AMLODIPINE-HCTZ 40-10-25 MG PO TABS: 1.0000 | ORAL_TABLET | Freq: Every day | ORAL | 3 refills | Status: DC

## 2022-04-17 MED ORDER — ATORVASTATIN CALCIUM 80 MG PO TABS: 80.0000 mg | ORAL_TABLET | Freq: Every day | ORAL | 3 refills | Status: DC

## 2022-04-17 MED ORDER — EMPAGLIFLOZIN 10 MG PO TABS: 10.0000 mg | ORAL_TABLET | Freq: Every day | ORAL | 1 refills | Status: DC

## 2022-04-17 MED ORDER — ESCITALOPRAM OXALATE 10 MG PO TABS: 10.0000 mg | ORAL_TABLET | Freq: Every day | ORAL | 3 refills | Status: DC

## 2022-04-17 NOTE — Assessment & Plan Note (Signed)
Well controlled. Continue current regimen. Follow up in  6 mo  

## 2022-04-17 NOTE — Progress Notes (Signed)
   Established Patient Office Visit  Subjective   Patient ID: Stephanie Perkins, female    DOB: Apr 17, 1960  Age: 62 y.o. MRN: [id number]  Chief Complaint  Patient presents with   Hypertension   ifg    HPI Hypertension- Pt denies chest pain, SOB, dizziness, or heart palpitations.  Taking meds as directed w/o problems.  Denies medication side effects.    Impaired fasting glucose-no increased thirst or urination. No symptoms consistent with hypoglycemia.    ROS    Objective:     BP 126/63   Pulse 82   Ht 5\' 6"  (1.676 m)   Wt 224 lb (101.6 kg)   SpO2 95%   BMI 36.15 kg/m    Physical Exam Vitals and nursing note reviewed.  Constitutional:      Appearance: She is well-developed.  HENT:     Head: Normocephalic and atraumatic.  Cardiovascular:     Rate and Rhythm: Normal rate and regular rhythm.     Heart sounds: Normal heart sounds.  Pulmonary:     Effort: Pulmonary effort is normal.     Breath sounds: Normal breath sounds.  Skin:    General: Skin is warm and dry.  Neurological:     Mental Status: She is alert and oriented to person, place, and time.  Psychiatric:        Behavior: Behavior normal.      Results for orders placed or performed in visit on 04/17/22  POCT glycosylated hemoglobin (Hb A1C)  Result Value Ref Range   Hemoglobin A1C 7.1 (A) 4.0 - 5.6 %   HbA1c POC (<> result, manual entry)     HbA1c, POC (prediabetic range)     HbA1c, POC (controlled diabetic range)        The 10-year ASCVD risk score (Arnett DK, et al., 2019) is: 15.5%    Assessment & Plan:   Problem List Items Addressed This Visit       Cardiovascular and Mediastinum   HYPERTENSION, BENIGN    Well controlled. Continue current regimen. Follow up in  61mo       Relevant Medications   Olmesartan-amLODIPine-HCTZ 40-10-25 MG TABS   atorvastatin (LIPITOR) 80 MG tablet     Endocrine   Diabetes mellitus type 2 with complications (HCC)    W0J was up today to 7.1 today, from  6.9. she has really worked hard to change her diet and lose weight over the last 3 months. She is down 14 lbs since last here. Discussed options for medication. Can try farxiga or Jardiance.  F/U in 3-4 moths.       Relevant Medications   empagliflozin (JARDIANCE) 10 MG TABS tablet   Olmesartan-amLODIPine-HCTZ 40-10-25 MG TABS   atorvastatin (LIPITOR) 80 MG tablet     Other   Hyperlipidemia   Relevant Medications   Olmesartan-amLODIPine-HCTZ 40-10-25 MG TABS   atorvastatin (LIPITOR) 80 MG tablet   Depression, recurrent (HCC)   Relevant Medications   escitalopram (LEXAPRO) 10 MG tablet   Other Visit Diagnoses     Prediabetes    -  Primary   Relevant Orders   POCT glycosylated hemoglobin (Hb A1C) (Completed)       Return in about 3 months (around 07/17/2022) for Diabetes follow-up.    Beatrice Lecher, MD

## 2022-04-17 NOTE — Assessment & Plan Note (Signed)
A1c was up today to 7.1 today, from 6.9. she has really worked hard to change her diet and lose weight over the last 3 months. She is down 14 lbs since last here. Discussed options for medication. Can try farxiga or Jardiance.  F/U in 3-4 moths.

## 2022-04-18 ENCOUNTER — Encounter: Payer: Self-pay | Admitting: Family Medicine

## 2022-04-28 ENCOUNTER — Telehealth: Payer: Self-pay | Admitting: *Deleted

## 2022-04-28 DIAGNOSIS — E118 Type 2 diabetes mellitus with unspecified complications: Secondary | ICD-10-CM

## 2022-04-28 MED ORDER — TRUE METRIX BLOOD GLUCOSE TEST VI STRP: ORAL_STRIP | 5 refills | Status: AC

## 2022-04-28 MED ORDER — TRUEPLUS LANCETS 33G MISC: 5 refills | Status: AC

## 2022-04-28 MED ORDER — TRUE METRIX AIR GLUCOSE METER W/DEVICE KIT: PACK | 0 refills | Status: AC

## 2022-04-28 NOTE — Telephone Encounter (Signed)
Refill for testing supplies

## 2022-05-23 ENCOUNTER — Encounter: Payer: Self-pay | Admitting: Sports Medicine

## 2022-05-25 ENCOUNTER — Ambulatory Visit (INDEPENDENT_AMBULATORY_CARE_PROVIDER_SITE_OTHER): Payer: Medicare HMO

## 2022-05-25 ENCOUNTER — Ambulatory Visit (INDEPENDENT_AMBULATORY_CARE_PROVIDER_SITE_OTHER): Payer: Medicare HMO | Admitting: Sports Medicine

## 2022-05-25 DIAGNOSIS — M170 Bilateral primary osteoarthritis of knee: Secondary | ICD-10-CM

## 2022-05-25 DIAGNOSIS — M1712 Unilateral primary osteoarthritis, left knee: Secondary | ICD-10-CM | POA: Diagnosis not present

## 2022-05-25 DIAGNOSIS — M1711 Unilateral primary osteoarthritis, right knee: Secondary | ICD-10-CM

## 2022-05-25 MED ORDER — DICLOFENAC SODIUM 75 MG PO TBEC: 75.0000 mg | DELAYED_RELEASE_TABLET | Freq: Two times a day (BID) | ORAL | 3 refills | Status: DC

## 2022-05-25 MED ORDER — TRIAMCINOLONE ACETONIDE 40 MG/ML IJ SUSP
80.0000 mg | Freq: Once | INTRAMUSCULAR | Status: AC
  Administered 2022-05-25: 80 mg via INTRAMUSCULAR

## 2022-05-25 NOTE — Addendum Note (Signed)
Addended by: Tarri Glenn A on: 05/25/2022 04:11 PM   Modules accepted: Orders

## 2022-05-25 NOTE — Progress Notes (Signed)
    Procedures performed today:    Procedure: Real-time Ultrasound Guided injection of the left knee Device: Samsung HS60  Verbal informed consent obtained.  Time-out conducted.  Noted no overlying erythema, induration, or other signs of local infection.  Skin prepped in a sterile fashion.  Local anesthesia: Topical Ethyl chloride.  With sterile technique and under real time ultrasound guidance: 1 cc Kenalog 40, 2 cc lidocaine, 2 cc bupivacaine injected easily Completed without difficulty  Advised to call if fevers/chills, erythema, induration, drainage, or persistent bleeding.  Images permanently stored and available for review in PACS.  Impression: Technically successful ultrasound guided injection.  Procedure: Real-time Ultrasound Guided injection of the right knee Device: Samsung HS60  Verbal informed consent obtained.  Time-out conducted.  Noted no overlying erythema, induration, or other signs of local infection.  Skin prepped in a sterile fashion.  Local anesthesia: Topical Ethyl chloride.  With sterile technique and under real time ultrasound guidance: 1 cc Kenalog 40, 2 cc lidocaine, 2 cc bupivacaine injected easily Completed without difficulty  Advised to call if fevers/chills, erythema, induration, drainage, or persistent bleeding.  Images permanently stored and available for review in PACS.  Impression: Technically successful ultrasound guided injection.  Independent interpretation of notes and tests performed by another provider:   None.  Brief History, Exam, Impression, and Recommendations:    Primary osteoarthritis of both knees Pleasant 62 year-old female, increasing bilateral knee pain, only minimal degenerative changes on x-rays. Constipation with Tylenol, was drowsy with meloxicam so we will switch to Voltaren and inject both of her knees today. Return to see me in 6 weeks.  I was very late for Doll's appointment so I will be no charge in this visit from a  physician fee and procedural standpoint.    ____________________________________________ Gwen Her. Dianah Field, M.D., ABFM., CAQSM., AME. Primary Care and Sports Medicine [facility]  Adjunct Professor of [employer]  Risk manager

## 2022-05-25 NOTE — Assessment & Plan Note (Addendum)
Pleasant 62 year-old female, increasing bilateral knee pain, only minimal degenerative changes on x-rays. Constipation with Tylenol, was drowsy with meloxicam so we will switch to Voltaren and inject both of her knees today. Return to see me in 6 weeks.  I was very late for Stephanie Perkins's appointment so I will be no charge in this visit from a physician fee and procedural standpoint.

## 2022-06-19 ENCOUNTER — Other Ambulatory Visit: Payer: Self-pay | Admitting: Family Medicine

## 2022-06-19 DIAGNOSIS — Z1231 Encounter for screening mammogram for malignant neoplasm of breast: Secondary | ICD-10-CM

## 2022-07-06 ENCOUNTER — Ambulatory Visit (INDEPENDENT_AMBULATORY_CARE_PROVIDER_SITE_OTHER): Payer: Medicare HMO | Admitting: Sports Medicine

## 2022-07-06 DIAGNOSIS — M170 Bilateral primary osteoarthritis of knee: Secondary | ICD-10-CM

## 2022-07-06 NOTE — Progress Notes (Signed)
    Procedures performed today:    None.  Independent interpretation of notes and tests performed by another provider:   None.  Brief History, Exam, Impression, and Recommendations:    Primary osteoarthritis of both knees This pleasant 62 year old female returns, she has bilateral knee osteoarthritis, she is doing about 70% better after the injections at the last visit, she did get some Voltaren, felt as though it gave her some brain fog. I have asked her to cut in half and try again with food as needed, otherwise she can return to see me as needed, next step would be viscosupplementation but she does not need this just yet, she can call me if her pain worsens and we can work on the approval.    ____________________________________________ Gwen Her. Dianah Field, M.D., ABFM., CAQSM., AME. Primary Care and Sports Medicine [facility]  Adjunct Professor of [employer] [facility] of Medicine  Risk manager

## 2022-07-06 NOTE — Assessment & Plan Note (Signed)
This pleasant 62 year old female returns, she has bilateral knee osteoarthritis, she is doing about 70% better after the injections at the last visit, she did get some Voltaren, felt as though it gave her some brain fog. I have asked her to cut in half and try again with food as needed, otherwise she can return to see me as needed, next step would be viscosupplementation but she does not need this just yet, she can call me if her pain worsens and we can work on the approval.

## 2022-07-19 ENCOUNTER — Ambulatory Visit (INDEPENDENT_AMBULATORY_CARE_PROVIDER_SITE_OTHER): Payer: Medicare HMO

## 2022-07-19 DIAGNOSIS — Z1231 Encounter for screening mammogram for malignant neoplasm of breast: Secondary | ICD-10-CM | POA: Diagnosis not present

## 2022-07-20 NOTE — Progress Notes (Signed)
Please call patient. Normal mammogram.  Repeat in 1 year.  

## 2022-07-24 ENCOUNTER — Ambulatory Visit (INDEPENDENT_AMBULATORY_CARE_PROVIDER_SITE_OTHER): Payer: Medicare HMO | Admitting: Family Medicine

## 2022-07-24 ENCOUNTER — Encounter: Payer: Self-pay | Admitting: Family Medicine

## 2022-07-24 VITALS — BP 124/65 | HR 81 | Ht 66.0 in | Wt 224.0 lb

## 2022-07-24 DIAGNOSIS — F339 Major depressive disorder, recurrent, unspecified: Secondary | ICD-10-CM | POA: Diagnosis not present

## 2022-07-24 DIAGNOSIS — Z720 Tobacco use: Secondary | ICD-10-CM | POA: Diagnosis not present

## 2022-07-24 DIAGNOSIS — E118 Type 2 diabetes mellitus with unspecified complications: Secondary | ICD-10-CM | POA: Diagnosis not present

## 2022-07-24 DIAGNOSIS — I10 Essential (primary) hypertension: Secondary | ICD-10-CM | POA: Diagnosis not present

## 2022-07-24 LAB — POCT GLYCOSYLATED HEMOGLOBIN (HGB A1C): Hemoglobin A1C: 6.5 % — AB (ref 4.0–5.6)

## 2022-07-24 NOTE — Assessment & Plan Note (Signed)
Well controlled off of medication.  Will continue to hold Jardiance for now since she is actually doing really well.  Continue current regimen. Follow up in 3

## 2022-07-24 NOTE — Assessment & Plan Note (Signed)
Well controlled. Continue current regimen. Follow up in  6 mo  

## 2022-07-24 NOTE — Progress Notes (Signed)
Established Patient Office Visit  Subjective   Patient ID: Stephanie Perkins, female    DOB: 19-Jan-1961  Age: 62 y.o. MRN: [id number]  Chief Complaint  Patient presents with   Hypertension   Diabetes    HPI  Diabetes - no hypoglycemic events. No wounds or sores that are not healing well. No increased thirst or urination. Checking glucose at home.  She is no longer taking the Jardiance she is that she took it for about 2 to 3 weeks and then had a unusual profound episode of diarrhea that occurred in the middle of the night.  The only thing that was new at that time was her [place] so she stopped it.  About a month later at the end of March she actually had an episode of vomiting and diarrhea that was pretty short-lived less than 24 hours and she did seek care for that was told that it was likely gastroenteritis.  She was not back on the Jardiance at that time and that she is been off of it for a couple of months.  Hypertension- Pt denies chest pain, SOB, dizziness, or heart palpitations.  Taking meds as directed w/o problems.  Denies medication side effects.      ROS    Objective:     BP 124/65   Pulse 81   Ht  (1.676 m)   Wt 224 lb (101.6 kg)   SpO2 94%   BMI 36.15 kg/m    Physical Exam Vitals and nursing note reviewed.  Constitutional:      Appearance: She is well-developed.  HENT:     Head: Normocephalic and atraumatic.  Cardiovascular:     Rate and Rhythm: Normal rate and regular rhythm.     Heart sounds: Normal heart sounds.  Pulmonary:     Effort: Pulmonary effort is normal.     Breath sounds: Normal breath sounds.  Skin:    General: Skin is warm and dry.  Neurological:     Mental Status: She is alert and oriented to person, place, and time.  Psychiatric:        Behavior: Behavior normal.      Results for orders placed or performed in visit on 07/24/22  POCT glycosylated hemoglobin (Hb A1C)  Result Value Ref Range   Hemoglobin A1C 6.5 (A) 4.0 -  5.6 %   HbA1c POC (<> result, manual entry)     HbA1c, POC (prediabetic range)     HbA1c, POC (controlled diabetic range)        The 10-year ASCVD risk score (Arnett DK, et al., 2019) is: 15%    Assessment & Plan:   Problem List Items Addressed This Visit       Cardiovascular and Mediastinum   HYPERTENSION, BENIGN    Well controlled. Continue current regimen. Follow up in  6 mo       Relevant Orders   COMPLETE METABOLIC PANEL WITH GFR     Endocrine   Diabetes mellitus type 2 with complications - Primary    Well controlled off of medication.  Will continue to hold Jardiance for now since she is actually doing really well.  Continue current regimen. Follow up in 3       Relevant Orders   POCT glycosylated hemoglobin (Hb A1C) (Completed)   POCT UA - Microalbumin   COMPLETE METABOLIC PANEL WITH GFR     Other   Tobacco abuse   Depression, recurrent (HCC)    Well with her current  regimen of Lexapro.  Follow-up in 6 months.       Tobacco  abuse-she is not currently ready to quit.  But encouraged her to reach out at any point if she is a more than happy to discuss options that may help improve her success.  Return in about 3 months (around 10/23/2022) for Diabetes follow-up.    Nani Gasser, MD

## 2022-07-24 NOTE — Assessment & Plan Note (Signed)
Well with her current regimen of Lexapro.  Follow-up in 6 months.

## 2022-09-18 ENCOUNTER — Other Ambulatory Visit: Payer: Self-pay | Admitting: Sports Medicine

## 2022-09-18 DIAGNOSIS — M170 Bilateral primary osteoarthritis of knee: Secondary | ICD-10-CM

## 2022-10-23 ENCOUNTER — Ambulatory Visit: Payer: Medicare HMO | Admitting: Family Medicine

## 2022-10-23 DIAGNOSIS — E118 Type 2 diabetes mellitus with unspecified complications: Secondary | ICD-10-CM

## 2022-10-23 NOTE — Progress Notes (Deleted)
   Established Patient Office Visit  Subjective   Patient ID: Stephanie Perkins, female    DOB: November 05, 1960  Age: 62 y.o. MRN: [id number]  No chief complaint on file.   HPI  Hypertension- Pt denies chest pain, SOB, dizziness, or heart palpitations.  Taking meds as directed w/o problems.  Denies medication side effects.    Diabetes - no hypoglycemic events. No wounds or sores that are not healing well. No increased thirst or urination. Checking glucose at home. Taking medications as prescribed without any side effects.   {History (Optional):23778}  ROS    Objective:     There were no vitals taken for this visit. {Vitals History (Optional):23777}  Physical Exam   No results found for any visits on 10/23/22.  {Labs (Optional):23779}  The 10-year ASCVD risk score (Arnett DK, et al., 2019) is: 15%    Assessment & Plan:   Problem List Items Addressed This Visit       Cardiovascular and Mediastinum   HYPERTENSION, BENIGN     Endocrine   Diabetes mellitus type 2 with complications (HCC) - Primary    No follow-ups on file.    Nani Gasser, MD

## 2022-11-13 DIAGNOSIS — H524 Presbyopia: Secondary | ICD-10-CM | POA: Diagnosis not present

## 2022-11-13 DIAGNOSIS — H5213 Myopia, bilateral: Secondary | ICD-10-CM | POA: Diagnosis not present

## 2022-11-27 ENCOUNTER — Ambulatory Visit: Payer: Medicare HMO | Admitting: Family Medicine

## 2022-11-30 ENCOUNTER — Encounter: Payer: Self-pay | Admitting: Sports Medicine

## 2022-12-08 ENCOUNTER — Other Ambulatory Visit (INDEPENDENT_AMBULATORY_CARE_PROVIDER_SITE_OTHER): Payer: Medicare HMO

## 2022-12-08 ENCOUNTER — Ambulatory Visit (INDEPENDENT_AMBULATORY_CARE_PROVIDER_SITE_OTHER): Payer: Medicare HMO | Admitting: Sports Medicine

## 2022-12-08 ENCOUNTER — Encounter: Payer: Self-pay | Admitting: Sports Medicine

## 2022-12-08 DIAGNOSIS — M170 Bilateral primary osteoarthritis of knee: Secondary | ICD-10-CM

## 2022-12-08 MED ORDER — TRIAMCINOLONE ACETONIDE 40 MG/ML IJ SUSP
80.0000 mg | Freq: Once | INTRAMUSCULAR | Status: AC
Start: 2022-12-08 — End: 2022-12-08
  Administered 2022-12-08: 80 mg via INTRAMUSCULAR

## 2022-12-08 NOTE — Progress Notes (Signed)
    Procedures performed today:    Procedure: Real-time Ultrasound Guided injection of the left knee Device: Samsung HS60  Verbal informed consent obtained.  Time-out conducted.  Noted no overlying erythema, induration, or other signs of local infection.  Skin prepped in a sterile fashion.  Local anesthesia: Topical Ethyl chloride.  With sterile technique and under real time ultrasound guidance: 1 cc Kenalog 40, 2 cc lidocaine, 2 cc bupivacaine injected easily Completed without difficulty  Advised to call if fevers/chills, erythema, induration, drainage, or persistent bleeding.  Images permanently stored and available for review in PACS.  Impression: Technically successful ultrasound guided injection.   Procedure: Real-time Ultrasound Guided injection of the right knee Device: Samsung HS60  Verbal informed consent obtained.  Time-out conducted.  Noted no overlying erythema, induration, or other signs of local infection.  Skin prepped in a sterile fashion.  Local anesthesia: Topical Ethyl chloride.  With sterile technique and under real time ultrasound guidance: 1 cc Kenalog 40, 2 cc lidocaine, 2 cc bupivacaine injected easily Completed without difficulty  Advised to call if fevers/chills, erythema, induration, drainage, or persistent bleeding.  Images permanently stored and available for review in PACS.  Impression: Technically successful ultrasound guided injection.  Independent interpretation of notes and tests performed by another provider:   None.  Brief History, Exam, Impression, and Recommendations:    Primary osteoarthritis of both knees Pleasant 62 year old female, known bilateral knee osteoarthritis, last injection was in February of this year, repeat injection today, return to see me as needed.    ____________________________________________ Ihor Austin. Benjamin Stain, M.D., ABFM., CAQSM., AME. Primary Care and Sports Medicine [facility]  [facility]  Adjunct Professor of Family Medicine  [place] [employer] [facility]  Restaurant manager, fast food

## 2022-12-08 NOTE — Addendum Note (Signed)
Addended by: Carren Rang A on: 12/08/2022 02:23 PM   Modules accepted: Orders

## 2022-12-08 NOTE — Assessment & Plan Note (Signed)
Pleasant 62 year old female, known bilateral knee osteoarthritis, last injection was in February of this year, repeat injection today, return to see me as needed.

## 2022-12-12 ENCOUNTER — Encounter: Payer: Self-pay | Admitting: Sports Medicine

## 2022-12-26 ENCOUNTER — Ambulatory Visit (INDEPENDENT_AMBULATORY_CARE_PROVIDER_SITE_OTHER): Payer: Medicare HMO | Admitting: Family Medicine

## 2022-12-26 ENCOUNTER — Encounter: Payer: Self-pay | Admitting: Family Medicine

## 2022-12-26 VITALS — BP 130/61 | HR 88 | Ht 66.0 in | Wt 232.0 lb

## 2022-12-26 DIAGNOSIS — E785 Hyperlipidemia, unspecified: Secondary | ICD-10-CM | POA: Diagnosis not present

## 2022-12-26 DIAGNOSIS — I10 Essential (primary) hypertension: Secondary | ICD-10-CM

## 2022-12-26 DIAGNOSIS — G35 Multiple sclerosis: Secondary | ICD-10-CM | POA: Diagnosis not present

## 2022-12-26 DIAGNOSIS — R319 Hematuria, unspecified: Secondary | ICD-10-CM | POA: Diagnosis not present

## 2022-12-26 DIAGNOSIS — D510 Vitamin B12 deficiency anemia due to intrinsic factor deficiency: Secondary | ICD-10-CM | POA: Diagnosis not present

## 2022-12-26 DIAGNOSIS — E538 Deficiency of other specified B group vitamins: Secondary | ICD-10-CM | POA: Diagnosis not present

## 2022-12-26 DIAGNOSIS — R350 Frequency of micturition: Secondary | ICD-10-CM | POA: Diagnosis not present

## 2022-12-26 DIAGNOSIS — E118 Type 2 diabetes mellitus with unspecified complications: Secondary | ICD-10-CM | POA: Diagnosis not present

## 2022-12-26 DIAGNOSIS — Z23 Encounter for immunization: Secondary | ICD-10-CM

## 2022-12-26 LAB — POCT UA - MICROALBUMIN
Albumin/Creatinine Ratio, Urine, POC: <30
Creatinine, POC: 300 mg/dL
Microalbumin Ur, POC: 30 mg/L

## 2022-12-26 LAB — POCT URINALYSIS DIP (CLINITEK)
Bilirubin, UA: NEGATIVE
Glucose, UA: NEGATIVE mg/dL
Ketones, POC UA: NEGATIVE mg/dL
Leukocytes, UA: NEGATIVE
Nitrite, UA: NEGATIVE
POC PROTEIN,UA: NEGATIVE
Spec Grav, UA: 1.02 (ref 1.010–1.025)
Urobilinogen, UA: 0.2 E.U./dL
pH, UA: 5.5 (ref 5.0–8.0)

## 2022-12-26 LAB — POCT GLYCOSYLATED HEMOGLOBIN (HGB A1C): Hemoglobin A1C: 6.3 % — AB (ref 4.0–5.6)

## 2022-12-26 MED ORDER — CYANOCOBALAMIN 1000 MCG/ML IJ SOLN: 1000.0000 ug | INTRAMUSCULAR | 3 refills | Status: DC

## 2022-12-26 NOTE — Assessment & Plan Note (Signed)
Well controlled. Continue current regimen. Follow up in  6 mo

## 2022-12-26 NOTE — Assessment & Plan Note (Addendum)
Well controlled. Continue current regimen. Follow up in  68mo . A112C is 6.3.

## 2022-12-26 NOTE — Addendum Note (Signed)
Addended by: Deno Etienne on: 12/26/2022 04:13 PM   Modules accepted: Orders

## 2022-12-26 NOTE — Progress Notes (Signed)
Established Patient Office Visit  Subjective   Patient ID: Stephanie Perkins, female    DOB: 06/07/1960  Age: 62 y.o. MRN: [id number]  Chief Complaint  Patient presents with   Diabetes   Hypertension    HPI  Hypertension- Pt denies chest pain, SOB, dizziness, or heart palpitations.  Taking meds as directed w/o problems.  Denies medication side effects.    Diabetes - no hypoglycemic events. No wounds or sores that are not healing well. No increased thirst or urination. Checking glucose at home. Taking medications as prescribed without any side effects.  Urinary urgency x 6 weeks.  She says it really only happens at night.  She says during the day is really not a problem she has been waking up 3-4 times at night with urgency and just barely makes it to the toilet and then sometimes has an accident.  No dysuria or hematuria fevers or chills.    ROS    Objective:     BP 130/61   Pulse 88   Ht 5\' 6"  (1.676 m)   Wt 232 lb (105.2 kg)   SpO2 98%   BMI 37.45 kg/m    Physical Exam Vitals and nursing note reviewed.  Constitutional:      Appearance: Normal appearance.  HENT:     Head: Normocephalic and atraumatic.  Eyes:     Conjunctiva/sclera: Conjunctivae normal.  Cardiovascular:     Rate and Rhythm: Normal rate and regular rhythm.  Pulmonary:     Effort: Pulmonary effort is normal.     Breath sounds: Normal breath sounds.  Skin:    General: Skin is warm and dry.  Neurological:     Mental Status: She is alert.  Psychiatric:        Mood and Affect: Mood normal.      Results for orders placed or performed in visit on 12/26/22  POCT HgB A1C  Result Value Ref Range   Hemoglobin A1C 6.3 (A) 4.0 - 5.6 %   HbA1c POC (<> result, manual entry)     HbA1c, POC (prediabetic range)     HbA1c, POC (controlled diabetic range)    POCT URINALYSIS DIP (CLINITEK)  Result Value Ref Range   Color, UA yellow yellow   Clarity, UA clear clear   Glucose, UA negative negative mg/dL    Bilirubin, UA negative negative   Ketones, POC UA negative negative mg/dL   Spec Grav, UA 9.562 1.308 - 1.025   Blood, UA moderate (A) negative   pH, UA 5.5 5.0 - 8.0   POC PROTEIN,UA negative negative, trace   Urobilinogen, UA 0.2 0.2 or 1.0 E.U./dL   Nitrite, UA Negative Negative   Leukocytes, UA Negative Negative  POCT UA - Microalbumin  Result Value Ref Range   Microalbumin Ur, POC 30 mg/L   Creatinine, POC 300 mg/dL   Albumin/Creatinine Ratio, Urine, POC <30       The 10-year ASCVD risk score (Arnett DK, et al., 2019) is: 16.4%    Assessment & Plan:   Problem List Items Addressed This Visit       Cardiovascular and Mediastinum   HYPERTENSION, BENIGN - Primary    Well controlled. Continue current regimen. Follow up in  18mo       Relevant Medications   cyanocobalamin (VITAMIN B12) 1000 MCG/ML injection   Other Relevant Orders   B12   CMP14+EGFR   Lipid panel   TSH     Endocrine   Diabetes mellitus type  2 with complications (HCC)    Well controlled. Continue current regimen. Follow up in  26mo . A112C is 6.3.       Relevant Orders   POCT HgB A1C (Completed)   POCT UA - Microalbumin (Completed)     Nervous and Auditory   MULTIPLE SCLEROSIS   Relevant Medications   cyanocobalamin (VITAMIN B12) 1000 MCG/ML injection   Other Relevant Orders   B12   CMP14+EGFR   Lipid panel   TSH     Other   Hyperlipidemia    Tolerating statin well.  Due to recheck lipids.      Relevant Medications   cyanocobalamin (VITAMIN B12) 1000 MCG/ML injection   Other Relevant Orders   B12   CMP14+EGFR   Lipid panel   TSH   B12 deficiency    Due for B12 refills for the year but we also need to get a blood level to make sure that were not over or under treating.      Relevant Medications   cyanocobalamin (VITAMIN B12) 1000 MCG/ML injection   Other Relevant Orders   B12   CMP14+EGFR   Lipid panel   TSH   Other Visit Diagnoses     Urinary frequency       Relevant  Medications   cyanocobalamin (VITAMIN B12) 1000 MCG/ML injection   Other Relevant Orders   POCT URINALYSIS DIP (CLINITEK) (Completed)   B12   CMP14+EGFR   Lipid panel   TSH   Encounter for immunization       Relevant Orders   Flu vaccine trivalent PF, 26mos and older(Flulaval,Afluria,Fluarix,Fluzone) (Completed)   Encounter for immunization       Relevant Orders   Pneumococcal conjugate vaccine 20-valent (Completed)      Urinary urgency that is primarily occurring at night.  We did discuss limiting fluids about an hour and a half before bedtime.  We could consider medication as an option.  Urinalysis looks negative though did show some blood.  She said she has been told that before.  Will send for culture to confirm that there is no underlying UTI.  Prevnar 20 given today.  Return in about 4 months (around 04/27/2023) for Diabetes follow-up, Hypertension.    Nani Gasser, MD

## 2022-12-26 NOTE — Assessment & Plan Note (Signed)
Due for B12 refills for the year but we also need to get a blood level to make sure that were not over or under treating.

## 2022-12-26 NOTE — Assessment & Plan Note (Signed)
Tolerating statin well.  Due to recheck lipids.

## 2022-12-27 LAB — CMP14+EGFR
ALT: 15 IU/L (ref 0–32)
AST: 16 IU/L (ref 0–40)
Albumin: 4.5 g/dL (ref 3.9–4.9)
Alkaline Phosphatase: 69 IU/L (ref 44–121)
BUN/Creatinine Ratio: 30 — ABNORMAL HIGH (ref 12–28)
BUN: 24 mg/dL (ref 8–27)
Bilirubin Total: 0.4 mg/dL (ref 0.0–1.2)
CO2: 22 mmol/L (ref 20–29)
Calcium: 10.4 mg/dL — ABNORMAL HIGH (ref 8.7–10.3)
Chloride: 103 mmol/L (ref 96–106)
Creatinine, Ser: 0.81 mg/dL (ref 0.57–1.00)
Globulin, Total: 2.8 g/dL (ref 1.5–4.5)
Glucose: 100 mg/dL — ABNORMAL HIGH (ref 70–99)
Potassium: 4.1 mmol/L (ref 3.5–5.2)
Sodium: 141 mmol/L (ref 134–144)
Total Protein: 7.3 g/dL (ref 6.0–8.5)
eGFR: 82 mL/min/{1.73_m2} (ref >59)

## 2022-12-27 LAB — LIPID PANEL
Chol/HDL Ratio: 4.5 ratio — ABNORMAL HIGH (ref 0.0–4.4)
Cholesterol, Total: 218 mg/dL — ABNORMAL HIGH (ref 100–199)
HDL: 48 mg/dL (ref >39)
LDL Chol Calc (NIH): 138 mg/dL — ABNORMAL HIGH (ref 0–99)
Triglycerides: 177 mg/dL — ABNORMAL HIGH (ref 0–149)
VLDL Cholesterol Cal: 32 mg/dL (ref 5–40)

## 2022-12-27 LAB — TSH: TSH: 2.07 u[IU]/mL (ref 0.450–4.500)

## 2022-12-27 LAB — VITAMIN B12: Vitamin B-12: 868 pg/mL (ref 232–1245)

## 2022-12-27 NOTE — Progress Notes (Signed)
Hi Stephanie Perkins,  Your calcium level is just borderline high but if you are taking extra calcium it could explain that. Not at a worrisome level. LDL cholesterol lis up just a little. Continue to work on [employer] and exercise. B12 is really on the high end. When was your last shot?  Thyroid looks great!!

## 2022-12-28 LAB — URINE CULTURE

## 2022-12-28 NOTE — Progress Notes (Signed)
OK, sounds good if shot was about 2 weeks ago then I think the level looks good I just wanted to make sure it was not too much B12.  Because that can cause symptoms just like having low B12.  So continue with current regimen of every 30 days.  The urine culture came back negative.  No sign of infection.  If you have already tried to cut out fluid for the 2 hours before bedtime then we could consider a medication option or consider referral to urology for further workup.

## 2022-12-29 ENCOUNTER — Telehealth: Payer: Self-pay | Admitting: Sports Medicine

## 2022-12-29 NOTE — Telephone Encounter (Signed)
Bilateral [employer] approval please, x-ray confirmed osteoarthritis, failed analgesics, steroid injections and 6 weeks of physical therapy

## 2023-01-03 NOTE — Telephone Encounter (Signed)
PA information submitted via [email or web address] for Orthovisc Paperwork has been printed and given to Dr. T for signatures. Once obtained, information will be faxed to [employer] at [phone]  

## 2023-01-05 ENCOUNTER — Ambulatory Visit: Payer: Medicare HMO | Admitting: Sports Medicine

## 2023-01-10 ENCOUNTER — Encounter: Payer: Self-pay | Admitting: Sports Medicine

## 2023-01-10 NOTE — Telephone Encounter (Signed)
Patient was approved with a $10 copay and she was notified and will call back and get scheduled.

## 2023-01-18 ENCOUNTER — Ambulatory Visit: Payer: Medicare HMO | Admitting: Sports Medicine

## 2023-01-18 ENCOUNTER — Other Ambulatory Visit (INDEPENDENT_AMBULATORY_CARE_PROVIDER_SITE_OTHER): Payer: Medicare HMO

## 2023-01-18 ENCOUNTER — Encounter: Payer: Self-pay | Admitting: Sports Medicine

## 2023-01-18 DIAGNOSIS — M170 Bilateral primary osteoarthritis of knee: Secondary | ICD-10-CM

## 2023-01-18 MED ORDER — DOCUSATE SODIUM 100 MG PO CAPS
100.0000 mg | ORAL_CAPSULE | Freq: Three times a day (TID) | ORAL | 3 refills | Status: DC | PRN
Start: 2023-01-18 — End: 2023-08-06

## 2023-01-18 MED ORDER — HYALURONAN 30 MG/2ML IX SOSY
30.0000 mg | PREFILLED_SYRINGE | Freq: Once | INTRA_ARTICULAR | Status: AC
Start: 2023-01-18 — End: 2023-01-18
  Administered 2023-01-18: 30 mg via INTRA_ARTICULAR

## 2023-01-18 MED ORDER — TRAMADOL HCL 50 MG PO TABS
25.0000 mg | ORAL_TABLET | Freq: Three times a day (TID) | ORAL | 0 refills | Status: DC | PRN
Start: 2023-01-18 — End: 2023-01-25

## 2023-01-18 NOTE — Assessment & Plan Note (Addendum)
Orthovisc No. 1 of 4 both knees, return in 1 week for #2 of 4. In a lot of pain today, adding tramadol and Colace.

## 2023-01-18 NOTE — Progress Notes (Signed)
    Procedures performed today:    Procedure: Real-time Ultrasound Guided injection of the left knee Device: Samsung HS60  Verbal informed consent obtained.  Time-out conducted.  Noted no overlying erythema, induration, or other signs of local infection.  Skin prepped in a sterile fashion.  Local anesthesia: Topical Ethyl chloride.  With sterile technique and under real time ultrasound guidance: Noted trace effusion, 30 mg/2 mL of OrthoVisc (sodium hyaluronate) in a prefilled syringe was injected easily into the knee through a 22-gauge needle. Completed without difficulty  Advised to call if fevers/chills, erythema, induration, drainage, or persistent bleeding.  Images permanently stored and available for review in PACS.  Impression: Technically successful ultrasound guided injection.  Procedure: Real-time Ultrasound Guided injection of the right knee Device: Samsung HS60  Verbal informed consent obtained.  Time-out conducted.  Noted no overlying erythema, induration, or other signs of local infection.  Skin prepped in a sterile fashion.  Local anesthesia: Topical Ethyl chloride.  With sterile technique and under real time ultrasound guidance: Noted trace effusion, 30 mg/2 mL of OrthoVisc (sodium hyaluronate) in a prefilled syringe was injected easily into the knee through a 22-gauge needle. Completed without difficulty  Advised to call if fevers/chills, erythema, induration, drainage, or persistent bleeding.  Images permanently stored and available for review in PACS.  Impression: Technically successful ultrasound guided injection.  Independent interpretation of notes and tests performed by another provider:   None.  Brief History, Exam, Impression, and Recommendations:    Primary osteoarthritis of both knees Orthovisc No. 1 of 4 both knees, return in 1 week for #2 of 4. In a lot of pain today, adding tramadol and  Colace.     ____________________________________________ Ihor Austin. Benjamin Stain, M.D., ABFM., CAQSM., AME. Primary Care and Sports Medicine [facility]  Adjunct Professor of Family Medicine  [employer] [facility] [employer]  Restaurant manager, fast food

## 2023-01-18 NOTE — Addendum Note (Signed)
Addended by: Carren Rang A on: 01/18/2023 02:37 PM   Modules accepted: Orders

## 2023-01-22 NOTE — Telephone Encounter (Signed)
Since the is just Orthovisc injection okay to double book approximately 1 week after her injection on the 17th.

## 2023-01-23 ENCOUNTER — Ambulatory Visit: Payer: Medicare HMO | Admitting: Sports Medicine

## 2023-01-25 ENCOUNTER — Encounter: Payer: Self-pay | Admitting: Sports Medicine

## 2023-01-25 ENCOUNTER — Other Ambulatory Visit (INDEPENDENT_AMBULATORY_CARE_PROVIDER_SITE_OTHER): Payer: Medicare HMO

## 2023-01-25 ENCOUNTER — Ambulatory Visit (INDEPENDENT_AMBULATORY_CARE_PROVIDER_SITE_OTHER): Payer: Medicare HMO | Admitting: Sports Medicine

## 2023-01-25 DIAGNOSIS — M170 Bilateral primary osteoarthritis of knee: Secondary | ICD-10-CM | POA: Diagnosis not present

## 2023-01-25 MED ORDER — HYALURONAN 30 MG/2ML IX SOSY
30.0000 mg | PREFILLED_SYRINGE | Freq: Once | INTRA_ARTICULAR | Status: AC
Start: 2023-01-25 — End: 2023-01-25
  Administered 2023-01-25: 30 mg via INTRA_ARTICULAR

## 2023-01-25 MED ORDER — TRAMADOL HCL 50 MG PO TABS: 25.0000 mg | ORAL_TABLET | Freq: Two times a day (BID) | ORAL | 0 refills | Status: DC | PRN

## 2023-01-25 NOTE — Progress Notes (Signed)
    Procedures performed today:    Procedure: Real-time Ultrasound Guided injection of the left knee Device: Samsung HS60  Verbal informed consent obtained.  Time-out conducted.  Noted no overlying erythema, induration, or other signs of local infection.  Skin prepped in a sterile fashion.  Local anesthesia: Topical Ethyl chloride.  With sterile technique and under real time ultrasound guidance: Noted trace effusion, 30 mg/2 mL of OrthoVisc (sodium hyaluronate) in a prefilled syringe was injected easily into the knee through a 22-gauge needle. Completed without difficulty  Advised to call if fevers/chills, erythema, induration, drainage, or persistent bleeding.  Images permanently stored and available for review in PACS.  Impression: Technically successful ultrasound guided injection.   Procedure: Real-time Ultrasound Guided injection of the right knee Device: Samsung HS60  Verbal informed consent obtained.  Time-out conducted.  Noted no overlying erythema, induration, or other signs of local infection.  Skin prepped in a sterile fashion.  Local anesthesia: Topical Ethyl chloride.  With sterile technique and under real time ultrasound guidance: Noted trace effusion, 30 mg/2 mL of OrthoVisc (sodium hyaluronate) in a prefilled syringe was injected easily into the knee through a 22-gauge needle. Completed without difficulty  Advised to call if fevers/chills, erythema, induration, drainage, or persistent bleeding.  Images permanently stored and available for review in PACS.  Impression: Technically successful ultrasound guided injection.  Independent interpretation of notes and tests performed by another provider:   None.  Brief History, Exam, Impression, and Recommendations:    Primary osteoarthritis of both knees Still in significant pain, Orthovisc 2 of 4 both knees, refilling tramadol, she uses it twice a day with Colace. We can continue tramadol until the series is  done.    ____________________________________________ Ihor Austin. Benjamin Stain, M.D., ABFM., CAQSM., AME. Primary Care and Sports Medicine [facility]  Adjunct Professor of Family Medicine  [employer] [facility] [employer]  Restaurant manager, fast food

## 2023-01-25 NOTE — Assessment & Plan Note (Signed)
Still in significant pain, Orthovisc 2 of 4 both knees, refilling tramadol, she uses it twice a day with Colace. We can continue tramadol until the series is done.

## 2023-01-29 ENCOUNTER — Ambulatory Visit: Payer: Medicare HMO | Admitting: Sports Medicine

## 2023-01-30 ENCOUNTER — Ambulatory Visit: Payer: Medicare HMO | Admitting: Family Medicine

## 2023-01-30 DIAGNOSIS — Z0000 Encounter for general adult medical examination without abnormal findings: Secondary | ICD-10-CM

## 2023-01-30 NOTE — Progress Notes (Signed)
MEDICARE ANNUAL WELLNESS VISIT  01/30/2023  Telephone Visit Disclaimer This Medicare AWV was conducted by telephone due to national recommendations for restrictions regarding the COVID-19 Pandemic (e.g. social distancing).  I verified, using two identifiers, that I am speaking with Stephanie Perkins or their authorized healthcare agent. I discussed the limitations, risks, security, and privacy concerns of performing an evaluation and management service by telephone and the potential availability of an in-person appointment in the future. The patient expressed understanding and agreed to proceed.  Location of Patient: Home Location of Provider (nurse):  In the office.  Subjective:    Stephanie Perkins is a 62 y.o. female patient of Metheney, Barbarann Ehlers, MD who had a Medicare Annual Wellness Visit today via telephone. Stephanie Perkins is Retired and lives with their son. she has one child. she reports that she is socially active and does interact with friends/family regularly. she is minimally physically active and enjoys taking care of her cats, dogs and watching television.  Patient Care Team: Agapito Games, MD as PCP - General Richardean Chimera, MD as Consulting Physician (Obstetrics and Gynecology) Adalberto Cole., MD as Referring Physician (Neurology) Gabriel Carina, [facility] as Pharmacist (Pharmacist)     01/30/2023    2:22 PM 01/27/2022    2:56 PM 01/03/2021   10:13 AM  Advanced Directives  Does Patient Have a Medical Advance Directive? No No No  Would patient like information on creating a medical advance directive? No - Patient declined No - Patient declined No - Patient declined    Hospital Utilization Over the Past 12 Months: # of hospitalizations or ER visits: 0 # of surgeries: 0  Review of Systems    Patient reports that her overall health is unchanged compared to last year.  History obtained from chart review and the patient  Patient Reported Readings (BP, Pulse, CBG, Weight,  etc) none Per patient no change in vitals since last visit, unable to obtain new vitals due to telehealth visit  Pain Assessment Pain : 0-10 Pain Score: 6  Pain Type: Chronic pain Pain Orientation: Right, Left Pain Descriptors / Indicators: Aching Pain Onset: More than a month ago Pain Frequency: Constant Pain Relieving Factors: tramadol  Pain Relieving Factors: tramadol  Current Medications & Allergies (verified) Allergies as of 01/30/2023       Reactions   Crestor [rosuvastatin] Other (See Comments)   FAtigue and myalgia   Januvia [sitagliptin] Swelling   Metformin And Related Other (See Comments)   Diarrrhea   Ocrevus [ocrelizumab] Diarrhea   Severed diarrhea   Penicillins    REACTION: hives   Zetia [ezetimibe] Other (See Comments)   Extreme fatigue        Medication List        Accurate as of January 30, 2023  2:37 PM. If you have any questions, ask your nurse or doctor.          atorvastatin 80 MG tablet Commonly known as: LIPITOR Take 1 tablet (80 mg total) by mouth at bedtime.   Copaxone 40 MG/ML Sosy Generic drug: Glatiramer Acetate Inject into the skin. Three times a week   cyanocobalamin 1000 MCG/ML injection Commonly known as: VITAMIN B12 Inject 1 mL (1,000 mcg total) into the muscle every 30 (thirty) days.   diclofenac 75 MG EC tablet Commonly known as: VOLTAREN TAKE 1 TABLET TWICE DAILY   docusate sodium 100 MG capsule Commonly known as: Colace Take 1 capsule (100 mg total) by mouth 3 (three) times  daily as needed.   escitalopram 10 MG tablet Commonly known as: LEXAPRO Take 1 tablet (10 mg total) by mouth daily.   modafinil 200 MG tablet Commonly known as: PROVIGIL TAKE 1-2 TABLETS BY MOUTH AS NEEDED FOR FAUTIGUE   Olmesartan-amLODIPine-HCTZ 40-10-25 MG Tabs Take 1 tablet by mouth daily.   traMADol 50 MG tablet Commonly known as: ULTRAM Take 0.5-1 tablets (25-50 mg total) by mouth every 12 (twelve) hours as needed for moderate  pain (pain score 4-6).   True Metrix Air Glucose Meter w/Device Kit For testing blood sugars once daily E11.8   True Metrix Blood Glucose Test test strip Generic drug: glucose blood For testing blood sugars once daily. E11.8   TRUEplus Lancets 33G Misc For testing blood sugars once daily. E11.8   Vitamin D (Ergocalciferol) 1.25 MG (50000 UNIT) Caps capsule Commonly known as: DRISDOL Take 50,000 Units by mouth 2 (two) times a week. Taking Saturdays & Wednesdays        History (reviewed): Past Medical History:  Diagnosis Date   Allergy 1966   Took allergy shots until 62 years old. I have had allergic reactions to medication etc   Anxiety ?   Depression    Hyperlipidemia    Hypertension    Multiple sclerosis (HCC)    Neuromuscular disorder (HCC) 1998   MS   Vision abnormalities    Past Surgical History:  Procedure Laterality Date   ABDOMINAL HYSTERECTOMY  2008   APPENDECTOMY  04/1997   CESAREAN SECTION  12/1983   TOTAL VAGINAL HYSTERECTOMY  03/2007   for fibroids, Dr. Arelia Sneddon   TUBAL LIGATION  2002   Family History  Problem Relation Age of Onset   Hyperlipidemia Mother    Hypertension Mother    Alcohol abuse Maternal Grandfather    Cancer Maternal Grandfather    ADD / ADHD Son    Social History   Socioeconomic History   Marital status: Divorced    Spouse name: Not on file   Number of children: Not on file   Years of education: 16   Highest education level: Bachelor's degree (e.g., BA, AB, BS)  Occupational History   Occupation: Disabled  Tobacco Use   Smoking status: Every Day    Current packs/day: 1.00    Average packs/day: 1 pack/day for 4.0 years (4.0 ttl pk-yrs)    Types: Cigarettes   Smokeless tobacco: Never   Tobacco comments:    Patient smokes 1 pack a day   Vaping Use   Vaping status: Former  Substance and Sexual Activity   Alcohol use: Yes    Alcohol/week: 7.0 standard drinks of alcohol    Types: 7 Standard drinks or equivalent per week    Drug use: No   Sexual activity: Not Currently    Birth control/protection: Surgical  Other Topics Concern   Not on file  Social History Narrative   Lives alone. She has one son. She enjoys taking care of her cats and dogs. She does like to watch television.   Social Determinants of Health   Financial Resource Strain: Low Risk  (01/30/2023)   Overall Financial Resource Strain (CARDIA)    Difficulty of Paying Living Expenses: Not hard at all  Food Insecurity: No Food Insecurity (01/30/2023)   Hunger Vital Sign    Worried About Running Out of Food in the Last Year: Never true    Ran Out of Food in the Last Year: Never true  Transportation Needs: No Transportation Needs (01/30/2023)   PRAPARE -  Administrator, Civil Service (Medical): No    Lack of Transportation (Non-Medical): No  Physical Activity: Inactive (01/30/2023)   Exercise Vital Sign    Days of Exercise per Week: 0 days    Minutes of Exercise per Session: 0 min  Stress: No Stress Concern Present (01/30/2023)   [employer] of Occupational Health - Occupational Stress Questionnaire    Feeling of Stress : Not at all  Social Connections: Socially Isolated (01/30/2023)   Social Connection and Isolation Panel [NHANES]    Frequency of Communication with Friends and Family: Once a week    Frequency of Social Gatherings with Friends and Family: Once a week    Attends Religious Services: Never    Database administrator or Organizations: No    Attends Banker Meetings: Never    Marital Status: Divorced    Activities of Daily Living    01/30/2023    2:28 PM  In your present state of health, do you have any difficulty performing the following activities:  Hearing? 0  Vision? 0  Difficulty concentrating or making decisions? 1  Walking or climbing stairs? 1  Comment bilateral knee pain; uses a cane  Dressing or bathing? 0  Doing errands, shopping? 0  Preparing Food and eating ? N  Using the  Toilet? N  In the past six months, have you accidently leaked urine? Y  Do you have problems with loss of bowel control? N  Managing your Medications? N  Managing your Finances? N  Housekeeping or managing your Housekeeping? N    Patient Education/ Literacy How often do you need to have someone help you when you read instructions, pamphlets, or other written materials from your doctor or pharmacy?: 1 - Never What is the last grade level you completed in school?: Bachelor's degree  Exercise    Diet Patient reports consuming 3 meals a day and 2 snack(s) a day Patient reports that her primary diet is: Regular Patient reports that she does have regular access to food.   Depression Screen    01/30/2023    2:22 PM 12/26/2022    2:45 PM 07/24/2022    2:20 PM 04/17/2022   11:40 AM 01/27/2022    2:56 PM 01/13/2022    2:10 PM 07/14/2021    2:29 PM  PHQ 2/9 Scores  PHQ - 2 Score 2 2 2 2  0 0 1  PHQ- 9 Score 8 13 13 9  3 4      Fall Risk    01/30/2023    2:22 PM 07/24/2022    2:06 PM 04/17/2022   11:37 AM 01/27/2022    2:56 PM 01/19/2022    1:38 PM  Fall Risk   Falls in the past year? 0 0 0 1 1  Number falls in past yr: 0 0 0 1 1  Injury with Fall? 0 0 0 0 0  Risk for fall due to : No Fall Risks No Fall Risks No Fall Risks History of fall(s);Impaired balance/gait   Follow up Falls evaluation completed Falls evaluation completed Falls evaluation completed Falls evaluation completed;Education provided;Falls prevention discussed      Objective:  Stephanie Perkins seemed alert and oriented and she participated appropriately during our telephone visit.  Blood Pressure Weight BMI  BP Readings from Last 3 Encounters:  12/26/22 130/61  07/24/22 124/65  04/17/22 126/63   Wt Readings from Last 3 Encounters:  12/26/22 232 lb (105.2 kg)  07/24/22 224 lb (101.6  kg)  04/17/22 224 lb (101.6 kg)   BMI Readings from Last 1 Encounters:  12/26/22 37.45 kg/m    *Unable to obtain current vital  signs, weight, and BMI due to telephone visit type  Hearing/Vision  [place] did not seem to have difficulty with hearing/understanding during the telephone conversation Reports that she has had a formal eye exam by an eye care professional within the past year Reports that she has not had a formal hearing evaluation within the past year *Unable to fully assess hearing and vision during telephone visit type  Cognitive Function:    01/30/2023    2:32 PM 01/27/2022    3:00 PM 01/03/2021   10:28 AM  6CIT Screen  What Year? 0 points 0 points 0 points  What month? 0 points 0 points 0 points  What time? 0 points 0 points 0 points  Count back from 20 0 points 0 points 0 points  Months in reverse 0 points 0 points 0 points  Repeat phrase 2 points 2 points 0 points  Total Score 2 points 2 points 0 points   (Normal:0-7, Significant for Dysfunction: >8)  Normal Cognitive Function Screening: Yes   Immunization & Health Maintenance Record Immunization History  Administered Date(s) Administered   Influenza, Seasonal, Injecte, Preservative Fre 12/26/2022   Influenza,inj,Quad PF,6+ Mos 03/13/2018, 01/13/2019, 04/15/2020, 01/13/2021, 01/13/2022   Influenza-Unspecified 01/21/2013, 03/13/2018, 01/13/2019   PFIZER(Purple Top)SARS-COV-2 Vaccination 06/27/2019, 07/22/2019, 01/19/2021   PNEUMOCOCCAL CONJUGATE-20 12/26/2022   Zoster Recombinant(Shingrix) 02/12/2021, 04/20/2021    Health Maintenance  Topic Date Due   DTaP/Tdap/Td (1 - Tdap) Never done   COVID-19 Vaccine (4 - 2023-24 season) 02/15/2023 (Originally 12/03/2022)   OPHTHALMOLOGY EXAM  04/03/2023 (Originally 04/02/1971)   HEMOGLOBIN A1C  06/25/2023   FOOT EXAM  07/24/2023   Fecal DNA (Cologuard)  10/21/2023   Diabetic kidney evaluation - eGFR measurement  12/26/2023   Diabetic kidney evaluation - Urine ACR  12/26/2023   Medicare Annual Wellness (AWV)  01/30/2024   MAMMOGRAM  07/18/2024   Pneumococcal Vaccine 2-97 Years old  Completed    INFLUENZA VACCINE  Completed   Hepatitis C Screening  Completed   HIV Screening  Completed   Zoster Vaccines- Shingrix  Completed   HPV VACCINES  Aged Out   Colonoscopy  Discontinued       Assessment  This is a routine wellness examination for Stephanie Perkins.  Health Maintenance: Due or Overdue Health Maintenance Due  Topic Date Due   DTaP/Tdap/Td (1 - Tdap) Never done    Stephanie Perkins does not need a referral for Community Assistance: Care Management:   no Social Work:    no Prescription Assistance:  no Nutrition/Diabetes Education:  no   Plan:  Personalized Goals  Goals Addressed               This Visit's Progress     Patient Stated (pt-stated)        Patient stated that she would like to loose weight and being able to walk without being in pain.       Personalized Health Maintenance & Screening Recommendations  Td vaccine  Lung Cancer Screening Recommended: no (Low Dose CT Chest recommended if Age 65-80 years, 20 pack-year currently smoking OR have quit w/in past 15 years) Hepatitis C Screening recommended: no HIV Screening recommended: no  Advanced Directives: Written information was not prepared per patient's request.  Referrals & Orders No orders of the defined types were placed in this encounter.  Follow-up Plan Follow-up with Agapito Games, MD as planned Schedule td vaccine at the pharmacy. Medicare wellness visit in one year.  Patient will access AVS on my chart.   I have personally reviewed and noted the following in the patient's chart:   Medical and social history Use of alcohol, tobacco or illicit drugs  Current medications and supplements Functional ability and status Nutritional status Physical activity Advanced directives List of other physicians Hospitalizations, surgeries, and ER visits in previous 12 months Vitals Screenings to include cognitive, depression, and falls Referrals and appointments  In addition, I  have reviewed and discussed with Stephanie Perkins certain preventive protocols, quality metrics, and best practice recommendations. A written personalized care plan for preventive services as well as general preventive health recommendations is available and can be mailed to the patient at her request.      Modesto Charon, RN BSN  01/30/2023

## 2023-01-30 NOTE — Patient Instructions (Addendum)
MEDICARE ANNUAL WELLNESS VISIT Health Maintenance Summary and Written Plan of Care  Stephanie Perkins ,  Thank you for allowing me to perform your Medicare Annual Wellness Visit and for your ongoing commitment to your health.   Health Maintenance & Immunization History Health Maintenance  Topic Date Due   DTaP/Tdap/Td (1 - Tdap) Never done   COVID-19 Vaccine (4 - 2023-24 season) 02/15/2023 (Originally 12/03/2022)   OPHTHALMOLOGY EXAM  04/03/2023 (Originally 04/02/1971)   HEMOGLOBIN A1C  06/25/2023   FOOT EXAM  07/24/2023   Fecal DNA (Cologuard)  10/21/2023   Diabetic kidney evaluation - eGFR measurement  12/26/2023   Diabetic kidney evaluation - Urine ACR  12/26/2023   Medicare Annual Wellness (AWV)  01/30/2024   MAMMOGRAM  07/18/2024   Pneumococcal Vaccine 15-4 Years old  Completed   INFLUENZA VACCINE  Completed   Hepatitis C Screening  Completed   HIV Screening  Completed   Zoster Vaccines- Shingrix  Completed   HPV VACCINES  Aged Out   Colonoscopy  Discontinued   Immunization History  Administered Date(s) Administered   Influenza, Seasonal, Injecte, Preservative Fre 12/26/2022   Influenza,inj,Quad PF,6+ Mos 03/13/2018, 01/13/2019, 04/15/2020, 01/13/2021, 01/13/2022   Influenza-Unspecified 01/21/2013, 03/13/2018, 01/13/2019   PFIZER(Purple Top)SARS-COV-2 Vaccination 06/27/2019, 07/22/2019, 01/19/2021   PNEUMOCOCCAL CONJUGATE-20 12/26/2022   Zoster Recombinant(Shingrix) 02/12/2021, 04/20/2021    These are the patient goals that we discussed:  Goals Addressed               This Visit's Progress     Patient Stated (pt-stated)        Patient stated that she would like to loose weight and being able to walk without being in pain.         This is a list of Health Maintenance Items that are overdue or due now: Health Maintenance Due  Topic Date Due   DTaP/Tdap/Td (1 - Tdap) Never done     Orders/Referrals Placed Today: No orders of the defined types were placed in  this encounter.  (Contact our referral department at [phone] if you have not spoken with someone about your referral appointment within the next 5 days)    Follow-up Plan Follow-up with Agapito Games, MD as planned Schedule td vaccine at the pharmacy. Medicare wellness visit in one year.  Patient will access AVS on my chart.      Health Maintenance, Female Adopting a healthy lifestyle and getting preventive care are important in promoting health and wellness. Ask your health care provider about: The right schedule for you to have regular tests and exams. Things you can do on your own to prevent diseases and keep yourself healthy. What should I know about diet, weight, and exercise? Eat a healthy diet  Eat a diet that includes plenty of vegetables, fruits, low-fat dairy products, and lean protein. Do not eat a lot of foods that are high in solid fats, added sugars, or sodium. Maintain a healthy weight Body mass index (BMI) is used to identify weight problems. It estimates body fat based on height and weight. Your health care provider can help determine your BMI and help you achieve or maintain a healthy weight. Get regular exercise Get regular exercise. This is one of the most important things you can do for your health. Most adults should: Exercise for at least 150 minutes each week. The exercise should increase your heart rate and make you sweat (moderate-intensity exercise). Do strengthening exercises at least twice a week. This is in addition to  the moderate-intensity exercise. Spend less time sitting. Even light physical activity can be beneficial. Watch cholesterol and blood lipids Have your blood tested for lipids and cholesterol at 63 years of age, then have this test every 5 years. Have your cholesterol levels checked more often if: Your lipid or cholesterol levels are high. You are older than 63 years of age. You are at high risk for heart disease. What  should I know about cancer screening? Depending on your health history and family history, you may need to have cancer screening at various ages. This may include screening for: Breast cancer. Cervical cancer. Colorectal cancer. Skin cancer. Lung cancer. What should I know about heart disease, diabetes, and high blood pressure? Blood pressure and heart disease High blood pressure causes heart disease and increases the risk of stroke. This is more likely to develop in people who have high blood pressure readings or are overweight. Have your blood pressure checked: Every 3-5 years if you are 83-5 years of age. Every year if you are 9 years old or older. Diabetes Have regular diabetes screenings. This checks your fasting blood sugar level. Have the screening done: Once every three years after age 4 if you are at a normal weight and have a low risk for diabetes. More often and at a younger age if you are overweight or have a high risk for diabetes. What should I know about preventing infection? Hepatitis B If you have a higher risk for hepatitis B, you should be screened for this virus. Talk with your health care provider to find out if you are at risk for hepatitis B infection. Hepatitis C Testing is recommended for: Everyone born from [birth year] through 1966. Anyone with known risk factors for hepatitis C. Sexually transmitted infections (STIs) Get screened for STIs, including gonorrhea and chlamydia, if: You are sexually active and are younger than 63 years of age. You are older than 63 years of age and your health care provider tells you that you are at risk for this type of infection. Your sexual activity has changed since you were last screened, and you are at increased risk for chlamydia or gonorrhea. Ask your health care provider if you are at risk. Ask your health care provider about whether you are at high risk for HIV. Your health care provider may recommend a prescription medicine  to help prevent HIV infection. If you choose to take medicine to prevent HIV, you should first get tested for HIV. You should then be tested every 3 months for as long as you are taking the medicine. Pregnancy If you are about to stop having your period (premenopausal) and you may become pregnant, seek counseling before you get pregnant. Take 400 to 800 micrograms (mcg) of folic acid every day if you become pregnant. Ask for birth control (contraception) if you want to prevent pregnancy. Osteoporosis and menopause Osteoporosis is a disease in which the bones lose minerals and strength with aging. This can result in bone fractures. If you are 50 years old or older, or if you are at risk for osteoporosis and fractures, ask your health care provider if you should: Be screened for bone loss. Take a calcium or vitamin D supplement to lower your risk of fractures. Be given hormone replacement therapy (HRT) to treat symptoms of menopause. Follow these instructions at home: Alcohol use Do not drink alcohol if: Your health care provider tells you not to drink. You are pregnant, may be pregnant, or are planning to become  pregnant. If you drink alcohol: Limit how much you have to: 0-1 drink a day. Know how much alcohol is in your drink. In the [place] one drink equals one 12 oz bottle of beer (355 mL), one 5 oz glass of wine (148 mL), or one 1 oz glass of hard liquor (44 mL). Lifestyle Do not use any products that contain nicotine or tobacco. These products include cigarettes, chewing tobacco, and vaping devices, such as e-cigarettes. If you need help quitting, ask your health care provider. Do not use street drugs. Do not share needles. Ask your health care provider for help if you need support or information about quitting drugs. General instructions Schedule regular health, dental, and eye exams. Stay current with your vaccines. Tell your health care provider if: You often feel depressed. You  have ever been abused or do not feel safe at home. Summary Adopting a healthy lifestyle and getting preventive care are important in promoting health and wellness. Follow your health care provider's instructions about healthy diet, exercising, and getting tested or screened for diseases. Follow your health care provider's instructions on monitoring your cholesterol and blood pressure. This information is not intended to replace advice given to you by your health care provider. Make sure you discuss any questions you have with your health care provider. Document Revised: 08/09/2020 Document Reviewed: 08/09/2020 Elsevier Patient Education  2024 [employer].

## 2023-02-01 ENCOUNTER — Ambulatory Visit: Payer: Medicare HMO | Admitting: Sports Medicine

## 2023-02-01 ENCOUNTER — Other Ambulatory Visit (INDEPENDENT_AMBULATORY_CARE_PROVIDER_SITE_OTHER): Payer: Medicare HMO

## 2023-02-01 ENCOUNTER — Encounter: Payer: Self-pay | Admitting: Sports Medicine

## 2023-02-01 DIAGNOSIS — M170 Bilateral primary osteoarthritis of knee: Secondary | ICD-10-CM

## 2023-02-01 MED ORDER — HYALURONAN 30 MG/2ML IX SOSY
30.0000 mg | PREFILLED_SYRINGE | Freq: Once | INTRA_ARTICULAR | Status: AC
  Administered 2023-02-01: 30 mg via INTRA_ARTICULAR

## 2023-02-01 NOTE — Addendum Note (Signed)
Addended by: Carren Rang A on: 02/01/2023 04:19 PM   Modules accepted: Orders

## 2023-02-01 NOTE — Progress Notes (Signed)
    Procedures performed today:    Procedure: Real-time Ultrasound Guided injection of the left knee Device: Samsung HS60  Verbal informed consent obtained.  Time-out conducted.  Noted no overlying erythema, induration, or other signs of local infection.  Skin prepped in a sterile fashion.  Local anesthesia: Topical Ethyl chloride.  With sterile technique and under real time ultrasound guidance: Noted trace effusion, 30 mg/2 mL of OrthoVisc (sodium hyaluronate) in a prefilled syringe was injected easily into the knee through a 22-gauge needle. Completed without difficulty  Advised to call if fevers/chills, erythema, induration, drainage, or persistent bleeding.  Images permanently stored and available for review in PACS.  Impression: Technically successful ultrasound guided injection.   Procedure: Real-time Ultrasound Guided injection of the right knee Device: Samsung HS60  Verbal informed consent obtained.  Time-out conducted.  Noted no overlying erythema, induration, or other signs of local infection.  Skin prepped in a sterile fashion.  Local anesthesia: Topical Ethyl chloride.  With sterile technique and under real time ultrasound guidance: Noted trace effusion, 30 mg/2 mL of OrthoVisc (sodium hyaluronate) in a prefilled syringe was injected easily into the knee through a 22-gauge needle. Completed without difficulty  Advised to call if fevers/chills, erythema, induration, drainage, or persistent bleeding.  Images permanently stored and available for review in PACS.  Impression: Technically successful ultrasound guided injection.  Independent interpretation of notes and tests performed by another provider:   None.  Brief History, Exam, Impression, and Recommendations:    Primary osteoarthritis of both knees Orthovisc No. 3 of 4 both knees, return in 1 week for #4 of 4. There was an effusion on the right side, if still present at the follow-up we do need to aspirate before  Orthovisc injection.  I have advised her to go up on tramadol to 3 times daily, we can continue this until the series is done.    ____________________________________________ Ihor Austin. Benjamin Stain, M.D., ABFM., CAQSM., AME. Primary Care and Sports Medicine [facility]  Adjunct Professor of Family Medicine  [employer] [facility] [employer] [facility] [employer]  Restaurant manager, fast food

## 2023-02-01 NOTE — Assessment & Plan Note (Signed)
Orthovisc No. 3 of 4 both knees, return in 1 week for #4 of 4. There was an effusion on the right side, if still present at the follow-up we do need to aspirate before Orthovisc injection.  I have advised her to go up on tramadol to 3 times daily, we can continue this until the series is done.

## 2023-02-08 ENCOUNTER — Other Ambulatory Visit (INDEPENDENT_AMBULATORY_CARE_PROVIDER_SITE_OTHER): Payer: Medicare HMO

## 2023-02-08 ENCOUNTER — Ambulatory Visit: Payer: Medicare HMO | Admitting: Sports Medicine

## 2023-02-08 ENCOUNTER — Encounter: Payer: Self-pay | Admitting: Sports Medicine

## 2023-02-08 DIAGNOSIS — M170 Bilateral primary osteoarthritis of knee: Secondary | ICD-10-CM | POA: Diagnosis not present

## 2023-02-08 MED ORDER — HYALURONAN 30 MG/2ML IX SOSY
30.0000 mg | PREFILLED_SYRINGE | Freq: Once | INTRA_ARTICULAR | Status: AC
  Administered 2023-02-08: 30 mg via INTRA_ARTICULAR

## 2023-02-08 NOTE — Progress Notes (Signed)
    Procedures performed today:    Procedure: Real-time Ultrasound Guided injection of the left knee Device: Samsung HS60  Verbal informed consent obtained.  Time-out conducted.  Noted no overlying erythema, induration, or other signs of local infection.  Skin prepped in a sterile fashion.  Local anesthesia: Topical Ethyl chloride.  With sterile technique and under real time ultrasound guidance: Noted trace effusion, 30 mg/2 mL of OrthoVisc (sodium hyaluronate) in a prefilled syringe was injected easily into the knee through a 22-gauge needle. Completed without difficulty  Advised to call if fevers/chills, erythema, induration, drainage, or persistent bleeding.  Images permanently stored and available for review in PACS.  Impression: Technically successful ultrasound guided injection.   Procedure: Real-time Ultrasound Guided aspiration/injection of the right knee Device: Samsung HS60  Verbal informed consent obtained.  Time-out conducted.  Noted no overlying erythema, induration, or other signs of local infection.  Skin prepped in a sterile fashion.  Local anesthesia: Topical Ethyl chloride.  With sterile technique and under real time ultrasound guidance: Noted moderate effusion, I advanced an 18-gauge needle into the suprapatellar recess and aspirated 12 mL of clear, straw-colored fluid, syringe switched and 30 mg/2 mL of OrthoVisc (sodium hyaluronate) in a prefilled syringe was injected easily into the knee. Completed without difficulty  Advised to call if fevers/chills, erythema, induration, drainage, or persistent bleeding.  Images permanently stored and available for review in PACS.  Impression: Technically successful ultrasound guided aspiration/injection.  Independent interpretation of notes and tests performed by another provider:   None.  Brief History, Exam, Impression, and Recommendations:    Primary osteoarthritis of both knees Orthovisc 4 of 4 both knees,  unfortunately she has not noted any improvement yet. We are going to get a knee MRI to evaluate if I am missing a major meniscal tear as her x-rays did show some loss of joint space but nothing dramatic. Sometimes Orthovisc can take up to a month or 6 weeks to reach full efficacy but due to her lack of improvement at this juncture, as well as her smoking she is not currently a good candidate for arthroplasty so I would like her to consult for bilateral geniculate artery embolization.    ____________________________________________ Ihor Austin. Benjamin Stain, M.D., ABFM., CAQSM., AME. Primary Care and Sports Medicine [facility]  Adjunct Professor of Family Medicine  [employer]  Restaurant manager, fast food

## 2023-02-08 NOTE — Assessment & Plan Note (Addendum)
Orthovisc 4 of 4 both knees, unfortunately she has not noted any improvement yet. We are going to get a knee MRI to evaluate if I am missing a major meniscal tear as her x-rays did show some loss of joint space but nothing dramatic. Sometimes Orthovisc can take up to a month or 6 weeks to reach full efficacy but due to her lack of improvement at this juncture, as well as her smoking she is not currently a good candidate for arthroplasty so I would like her to consult for bilateral geniculate artery embolization.

## 2023-02-21 DIAGNOSIS — R5383 Other fatigue: Secondary | ICD-10-CM | POA: Diagnosis not present

## 2023-02-21 DIAGNOSIS — G35 Multiple sclerosis: Secondary | ICD-10-CM | POA: Diagnosis not present

## 2023-02-22 ENCOUNTER — Telehealth: Payer: Self-pay | Admitting: Family Medicine

## 2023-02-22 NOTE — Telephone Encounter (Signed)
Stephanie Perkins called in stating that the preforming facility info for MRI is wrong. It should be: NPI: [id number] TAX ID: [id number]

## 2023-02-23 NOTE — Telephone Encounter (Signed)
Task completed. The auth was resubmitted with the correct NPI & preferred imaging location ([facility]).

## 2023-02-24 ENCOUNTER — Ambulatory Visit (HOSPITAL_COMMUNITY)
Admission: RE | Admit: 2023-02-24 | Discharge: 2023-02-24 | Disposition: A | Discharge status: Home / Self Care | Payer: Medicare HMO | Source: Ambulatory Visit | Attending: Sports Medicine | Admitting: Sports Medicine

## 2023-02-24 DIAGNOSIS — M170 Bilateral primary osteoarthritis of knee: Secondary | ICD-10-CM | POA: Diagnosis present

## 2023-02-24 DIAGNOSIS — M25461 Effusion, right knee: Secondary | ICD-10-CM | POA: Diagnosis not present

## 2023-02-24 DIAGNOSIS — S83231A Complex tear of medial meniscus, current injury, right knee, initial encounter: Secondary | ICD-10-CM | POA: Diagnosis not present

## 2023-03-07 ENCOUNTER — Other Ambulatory Visit: Payer: Self-pay | Admitting: Family Medicine

## 2023-03-07 DIAGNOSIS — E785 Hyperlipidemia, unspecified: Secondary | ICD-10-CM

## 2023-03-11 ENCOUNTER — Encounter: Payer: Self-pay | Admitting: Sports Medicine

## 2023-03-11 DIAGNOSIS — M170 Bilateral primary osteoarthritis of knee: Secondary | ICD-10-CM

## 2023-03-11 DIAGNOSIS — S83231D Complex tear of medial meniscus, current injury, right knee, subsequent encounter: Secondary | ICD-10-CM

## 2023-03-15 NOTE — Progress Notes (Signed)
Chief Complaint: Patient was seen in consultation today for bilateral knee pain.   Referring Physician(s): Rodney Langton J  History of Present Illness: Stephanie Perkins is a 62 y.o. female with a medical history significant for anxiety/depression, HTN, multiple sclerosis, tobacco use and primary osteoarthritis of both knees with chronic pain. She has been treated conservatively with steroid injections, over the counter medications and she just completed the Orthovisc series. Both knees have been treated and she continues to have lifestyle limiting pain. She is not a good candidate for arthroplasty at this time and she has been kindly referred to our team to discuss geniculate artery embolization.  Her knee pain has been present for 2-3 years.  She reports no inciting trauma or event.  The right knee hurts significantly worse than the left.  She believes recent gel injections helped the left knee.  She uses a cane to ambulate.  She tried tramadol in the past but it made her loopy.  She takes advil which helps relieve the pain slightly.  She has never consulted with an Orthopedic Surgeon.    Womac Pain Score = 54/96 VAS Pain Score = 8/10  Past Medical History:  Diagnosis Date   Allergy 1966   Took allergy shots until 62 years old. I have had allergic reactions to medication etc   Anxiety ?   Depression    Hyperlipidemia    Hypertension    Multiple sclerosis (HCC)    Neuromuscular disorder (HCC) 1998   MS   Vision abnormalities     Past Surgical History:  Procedure Laterality Date   ABDOMINAL HYSTERECTOMY  2008   APPENDECTOMY  04/1997   CESAREAN SECTION  12/1983   TOTAL VAGINAL HYSTERECTOMY  03/2007   for fibroids, Dr. Arelia Sneddon   TUBAL LIGATION  2002    Allergies: Crestor [rosuvastatin], Januvia [sitagliptin], Metformin and related, Ocrevus [ocrelizumab], Penicillins, and Zetia [ezetimibe]  Medications: Prior to Admission medications   Medication Sig Start Date End Date  Taking? Authorizing Provider  atorvastatin (LIPITOR) 80 MG tablet TAKE 1 TABLET AT BEDTIME 03/14/23   Agapito Games, MD  Blood Glucose Monitoring Suppl (TRUE METRIX AIR GLUCOSE METER) w/Device KIT For testing blood sugars once daily E11.8 04/28/22   Agapito Games, MD  COPAXONE 40 MG/ML SOSY Inject into the skin. Three times a week 01/29/23   [provider]  cyanocobalamin (VITAMIN B12) 1000 MCG/ML injection Inject 1 mL (1,000 mcg total) into the muscle every 30 (thirty) days. 12/26/22   Agapito Games, MD  diclofenac (VOLTAREN) 75 MG EC tablet TAKE 1 TABLET TWICE DAILY 09/18/22   Agapito Games, MD  docusate sodium (COLACE) 100 MG capsule Take 1 capsule (100 mg total) by mouth 3 (three) times daily as needed. 01/18/23   Monica Becton, MD  escitalopram (LEXAPRO) 10 MG tablet Take 1 tablet (10 mg total) by mouth daily. 04/17/22   Agapito Games, MD  modafinil (PROVIGIL) 200 MG tablet TAKE 1-2 TABLETS BY MOUTH AS NEEDED FOR FAUTIGUE 08/29/14   [provider]  Olmesartan-amLODIPine-HCTZ 40-10-25 MG TABS Take 1 tablet by mouth daily. 04/17/22   Agapito Games, MD  traMADol (ULTRAM) 50 MG tablet Take 1-2 tablets (50-100 mg total) by mouth 3 (three) times daily as needed for moderate pain (pain score 4-6). 02/01/23   Monica Becton, MD  TRUE METRIX BLOOD GLUCOSE TEST test strip For testing blood sugars once daily. E11.8 04/28/22   Agapito Games, MD  TRUEplus Lancets  33G MISC For testing blood sugars once daily. E11.8 04/28/22   Agapito Games, MD  Vitamin D, Ergocalciferol, (DRISDOL) 1.25 MG (50000 UNIT) CAPS capsule Take 50,000 Units by mouth 2 (two) times a week. Taking Saturdays & Wednesdays    [provider]     Family History  Problem Relation Age of Onset   Hyperlipidemia Mother    Hypertension Mother    Alcohol abuse Maternal Grandfather    Cancer Maternal Grandfather    ADD / ADHD Son      Social History   Socioeconomic History   Marital status: Divorced    Spouse name: Not on file   Number of children: Not on file   Years of education: 16   Highest education level: Bachelor's degree (e.g., BA, AB, BS)  Occupational History   Occupation: Disabled  Tobacco Use   Smoking status: Every Day    Current packs/day: 1.00    Average packs/day: 1 pack/day for 4.0 years (4.0 ttl pk-yrs)    Types: Cigarettes   Smokeless tobacco: Never   Tobacco comments:    Patient smokes 1 pack a day   Vaping Use   Vaping status: Former  Substance and Sexual Activity   Alcohol use: Yes    Alcohol/week: 7.0 standard drinks of alcohol    Types: 7 Standard drinks or equivalent per week   Drug use: No   Sexual activity: Not Currently    Birth control/protection: Surgical  Other Topics Concern   Not on file  Social History Narrative   Lives alone. She has one son. She enjoys taking care of her cats and dogs. She does like to watch television.   Social Drivers of Corporate investment banker Strain: Low Risk  (01/30/2023)   Overall Financial Resource Strain (CARDIA)    Difficulty of Paying Living Expenses: Not hard at all  Food Insecurity: No Food Insecurity (01/30/2023)   Hunger Vital Sign    Worried About Running Out of Food in the Last Year: Never true    Ran Out of Food in the Last Year: Never true  Transportation Needs: No Transportation Needs (01/30/2023)   PRAPARE - Administrator, Civil Service (Medical): No    Lack of Transportation (Non-Medical): No  Physical Activity: Inactive (01/30/2023)   Exercise Vital Sign    Days of Exercise per Week: 0 days    Minutes of Exercise per Session: 0 min  Stress: No Stress Concern Present (01/30/2023)   [employer] of Occupational Health - Occupational Stress Questionnaire    Feeling of Stress : Not at all  Social Connections: Socially Isolated (01/30/2023)   Social Connection and Isolation Panel [NHANES]     Frequency of Communication with Friends and Family: Once a week    Frequency of Social Gatherings with Friends and Family: Once a week    Attends Religious Services: Never    Database administrator or Organizations: No    Attends Engineer, structural: Never    Marital Status: Divorced    Review of Systems: A 12 point ROS discussed and pertinent positives are indicated in the HPI above.  All other systems are negative.  Vital Signs: There were no vitals taken for this visit.  Physical Exam Constitutional:      General: She is not in acute distress. HENT:     Head: Normocephalic.     Mouth/Throat:     Mouth: Mucous membranes are moist.  Eyes:  Conjunctiva/sclera: Conjunctivae normal.  Cardiovascular:     Rate and Rhythm: Normal rate and regular rhythm.  Pulmonary:     Effort: No respiratory distress.  Abdominal:     General: There is no distension.  Musculoskeletal:     Right lower leg: No edema.     Left lower leg: No edema.  Skin:    General: Skin is warm and dry.  Neurological:     Mental Status: She is alert and oriented to person, place, and time.     Imaging: Bilateral knee radiographs 01/23/22  Kellgren and Lyman Bishop Grade 2 bilaterally  MRI Right knee 02/24/23 IMPRESSION: 1. Complex tear of the posterior horn-body junction of the medial meniscus with peripheral meniscal extrusion. Degeneration of the posterior horn of the medial meniscus. Small radial tear of the free edge of the anterior horn of the medial meniscus. 2. High-grade partial-thickness cartilage loss of the weight-bearing medial femorotibial compartment. 3. Mild partial-thickness cartilage loss of the medial patellar facet and trochlear groove.    Labs:  CBC: No results for input(s): "WBC", "HGB", "HCT", "PLT" in the last 8760 hours.  COAGS: No results for input(s): "INR", "APTT" in the last 8760 hours.  BMP: Recent Labs    12/26/22 1506  NA 141  K 4.1  CL 103  CO2 22   GLUCOSE 100*  BUN 24  CALCIUM 10.4*  CREATININE 0.81    LIVER FUNCTION TESTS: Recent Labs    12/26/22 1506  BILITOT 0.4  AST 16  ALT 15  ALKPHOS 69  PROT 7.3  ALBUMIN 4.5    TUMOR MARKERS: No results for input(s): "AFPTM", "CEA", "CA199", "CHROMGRNA" in the last 8760 hours.  Assessment and Plan: 62 year old female with a history of bilateral osteoarthritis and right greater than left chronic knee pain (WOMAC 54/96, VAS 8/10).  Her osteoarthritis is mild radiographically, however high grade in the medial comparment on recent MRI.  She also has right medial meniscal tears which may be contributing to her symptoms.    We discussed the rationale, periprocedural expectations, risks, and benefits of geniculate artery embolization.  We discussed that the pain may be primarily related to her meniscal tears, and this may be better addressed surgically.     She would like to potentially speak with an Orthopedic Surgeon and consider her options before proceeding.  If she decides to pursue GAE we can proceed with scheduling.  Follow up with IR as needed.   Marliss Coots, MD Pager: [phone]    I spent a total of  40 Minutes  in face to face in clinical consultation, greater than 50% of which was counseling/coordinating care for bilateral knee pain.

## 2023-03-19 ENCOUNTER — Ambulatory Visit
Admission: RE | Admit: 2023-03-19 | Discharge: 2023-03-19 | Disposition: A | Discharge status: Home / Self Care | Payer: Medicare HMO | Source: Ambulatory Visit | Attending: Sports Medicine | Admitting: Sports Medicine

## 2023-03-19 DIAGNOSIS — M25561 Pain in right knee: Secondary | ICD-10-CM | POA: Diagnosis not present

## 2023-03-19 DIAGNOSIS — M170 Bilateral primary osteoarthritis of knee: Secondary | ICD-10-CM

## 2023-03-19 DIAGNOSIS — M25562 Pain in left knee: Secondary | ICD-10-CM | POA: Diagnosis not present

## 2023-03-19 HISTORY — PX: IR RADIOLOGIST EVAL & MGMT: IMG5224

## 2023-04-17 DIAGNOSIS — M1711 Unilateral primary osteoarthritis, right knee: Secondary | ICD-10-CM | POA: Diagnosis not present

## 2023-04-27 ENCOUNTER — Ambulatory Visit: Payer: Medicare HMO | Admitting: Family Medicine

## 2023-04-30 ENCOUNTER — Encounter: Payer: Self-pay | Admitting: Family Medicine

## 2023-04-30 ENCOUNTER — Ambulatory Visit (INDEPENDENT_AMBULATORY_CARE_PROVIDER_SITE_OTHER): Payer: Medicare HMO | Admitting: Family Medicine

## 2023-04-30 VITALS — BP 129/61 | HR 87 | Ht 66.0 in | Wt 238.0 lb

## 2023-04-30 DIAGNOSIS — G35 Multiple sclerosis: Secondary | ICD-10-CM

## 2023-04-30 DIAGNOSIS — E118 Type 2 diabetes mellitus with unspecified complications: Secondary | ICD-10-CM | POA: Diagnosis not present

## 2023-04-30 DIAGNOSIS — I10 Essential (primary) hypertension: Secondary | ICD-10-CM

## 2023-04-30 DIAGNOSIS — F339 Major depressive disorder, recurrent, unspecified: Secondary | ICD-10-CM | POA: Diagnosis not present

## 2023-04-30 LAB — POCT GLYCOSYLATED HEMOGLOBIN (HGB A1C): Hemoglobin A1C: 7.2 % — AB (ref 4.0–5.6)

## 2023-04-30 NOTE — Assessment & Plan Note (Signed)
On Capaxone.

## 2023-04-30 NOTE — Assessment & Plan Note (Signed)
A1c is up from 6.3-7.2.  She admits that she is just not been great with her diet because she has not been able to get out much with her knee and even grocery shop.  Just really encouraged her to work on getting her sugars back under control before surgery at the end of February it can make a big difference in the healing process.

## 2023-04-30 NOTE — Progress Notes (Signed)
   Established Patient Office Visit  Subjective  Patient ID: Stephanie Perkins, female    DOB: 09/02/1960  Age: 63 y.o. MRN: [id number]  Chief Complaint  Patient presents with   Diabetes    HPI  Diabetes - no hypoglycemic events. No wounds or sores that are not healing well. No increased thirst or urination. Checking glucose at home. Taking medications as prescribed without any side effects.  She is going to be scheduled for arthroscopic right knee surgery at the end of February.  She did the injections and they really did not help so she ended up finally getting an MRI and they saw a torn meniscus she also has some pretty significant arthritis as they also talked about possibly a knee replacement in her future.\\\     ROS    Objective:     BP 129/61   Pulse 87   Ht 5\' 6"  (1.676 m)   Wt 238 lb (108 kg)   SpO2 95%   BMI 38.41 kg/m    Physical Exam Vitals and nursing note reviewed.  Constitutional:      Appearance: Normal appearance.  HENT:     Head: Normocephalic and atraumatic.  Eyes:     Conjunctiva/sclera: Conjunctivae normal.  Cardiovascular:     Rate and Rhythm: Normal rate and regular rhythm.  Pulmonary:     Effort: Pulmonary effort is normal.     Breath sounds: Normal breath sounds.  Skin:    General: Skin is warm and dry.  Neurological:     Mental Status: She is alert.  Psychiatric:        Mood and Affect: Mood normal.      Results for orders placed or performed in visit on 04/30/23  POCT HgB A1C  Result Value Ref Range   Hemoglobin A1C 7.2 (A) 4.0 - 5.6 %   HbA1c POC (<> result, manual entry)     HbA1c, POC (prediabetic range)     HbA1c, POC (controlled diabetic range)        The 10-year ASCVD risk score (Arnett DK, et al., 2019) is: 18.3%    Assessment & Plan:   Problem List Items Addressed This Visit       Cardiovascular and Mediastinum   HYPERTENSION, BENIGN - Primary   Well controlled. Continue current regimen. Follow up in  58mo          Endocrine   Diabetes mellitus type 2 with complications (HCC)   A1c is up from 6.3-7.2.  She admits that she is just not been great with her diet because she has not been able to get out much with her knee and even grocery shop.  Just really encouraged her to work on getting her sugars back under control before surgery at the end of February it can make a big difference in the healing process.      Relevant Orders   POCT HgB A1C (Completed)     Nervous and Auditory   MULTIPLE SCLEROSIS   On Capaxone.          Other   Depression, recurrent (HCC)   Well on current regimen.  Continue with Lexapro.  Follow-up in 6 months.       Return in about 14 weeks (around 08/06/2023) for Diabetes follow-up.    Nani Gasser, MD

## 2023-04-30 NOTE — Assessment & Plan Note (Signed)
Well on current regimen.  Continue with Lexapro.  Follow-up in 6 months.

## 2023-04-30 NOTE — Assessment & Plan Note (Signed)
Well controlled. Continue current regimen. Follow up in  6 mo

## 2023-05-30 DIAGNOSIS — M6751 Plica syndrome, right knee: Secondary | ICD-10-CM | POA: Diagnosis not present

## 2023-05-30 DIAGNOSIS — M2341 Loose body in knee, right knee: Secondary | ICD-10-CM | POA: Diagnosis not present

## 2023-05-30 DIAGNOSIS — M94261 Chondromalacia, right knee: Secondary | ICD-10-CM | POA: Diagnosis not present

## 2023-05-30 DIAGNOSIS — G8918 Other acute postprocedural pain: Secondary | ICD-10-CM | POA: Diagnosis not present

## 2023-05-30 DIAGNOSIS — M2241 Chondromalacia patellae, right knee: Secondary | ICD-10-CM | POA: Diagnosis not present

## 2023-05-30 DIAGNOSIS — S83231A Complex tear of medial meniscus, current injury, right knee, initial encounter: Secondary | ICD-10-CM | POA: Diagnosis not present

## 2023-06-03 ENCOUNTER — Other Ambulatory Visit: Payer: Self-pay | Admitting: Family Medicine

## 2023-06-03 DIAGNOSIS — I10 Essential (primary) hypertension: Secondary | ICD-10-CM

## 2023-06-03 DIAGNOSIS — E118 Type 2 diabetes mellitus with unspecified complications: Secondary | ICD-10-CM

## 2023-06-05 DIAGNOSIS — M25661 Stiffness of right knee, not elsewhere classified: Secondary | ICD-10-CM | POA: Diagnosis not present

## 2023-06-05 DIAGNOSIS — R262 Difficulty in walking, not elsewhere classified: Secondary | ICD-10-CM | POA: Diagnosis not present

## 2023-06-05 DIAGNOSIS — M6281 Muscle weakness (generalized): Secondary | ICD-10-CM | POA: Diagnosis not present

## 2023-06-07 DIAGNOSIS — M25661 Stiffness of right knee, not elsewhere classified: Secondary | ICD-10-CM | POA: Diagnosis not present

## 2023-06-07 DIAGNOSIS — R262 Difficulty in walking, not elsewhere classified: Secondary | ICD-10-CM | POA: Diagnosis not present

## 2023-06-07 DIAGNOSIS — M6281 Muscle weakness (generalized): Secondary | ICD-10-CM | POA: Diagnosis not present

## 2023-06-08 ENCOUNTER — Other Ambulatory Visit: Payer: Self-pay | Admitting: Family Medicine

## 2023-06-08 DIAGNOSIS — F339 Major depressive disorder, recurrent, unspecified: Secondary | ICD-10-CM

## 2023-06-09 IMAGING — MG MM DIGITAL SCREENING BILAT W/ TOMO AND CAD
8 series · 8 of 24 positions shown · non-contrast
Comparison: Previous exam(s).

CLINICAL DATA: Screening.

EXAM:
DIGITAL SCREENING BILATERAL MAMMOGRAM WITH TOMOSYNTHESIS AND CAD
TECHNIQUE: Bilateral screening digital craniocaudal and mediolateral oblique
mammograms were obtained. Bilateral screening digital breast
tomosynthesis was performed. The images were evaluated with
computer-aided detection.

[L MLO synth-2D]
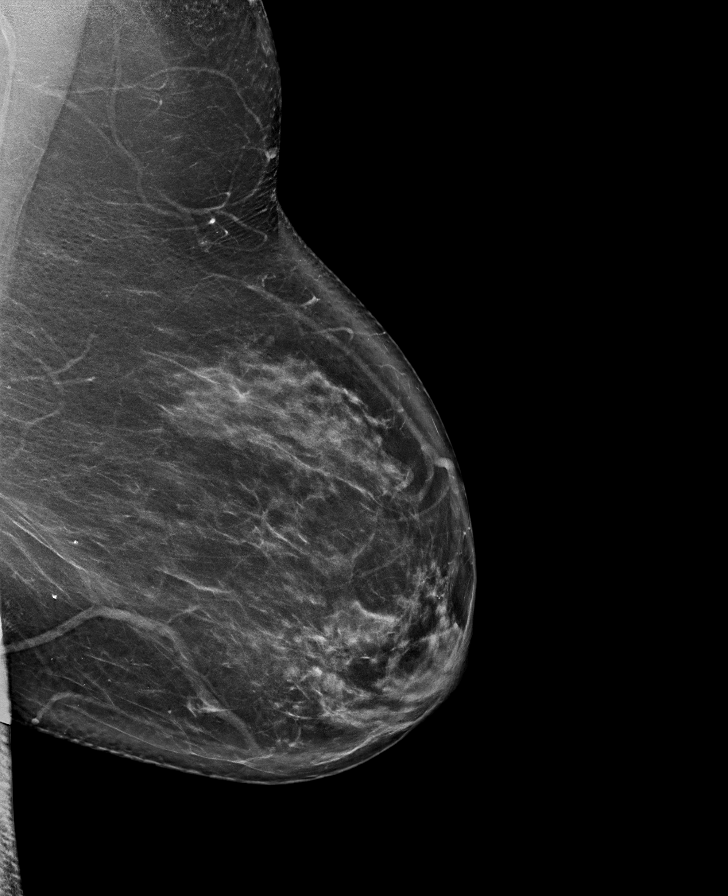

[L CC synth-2D]
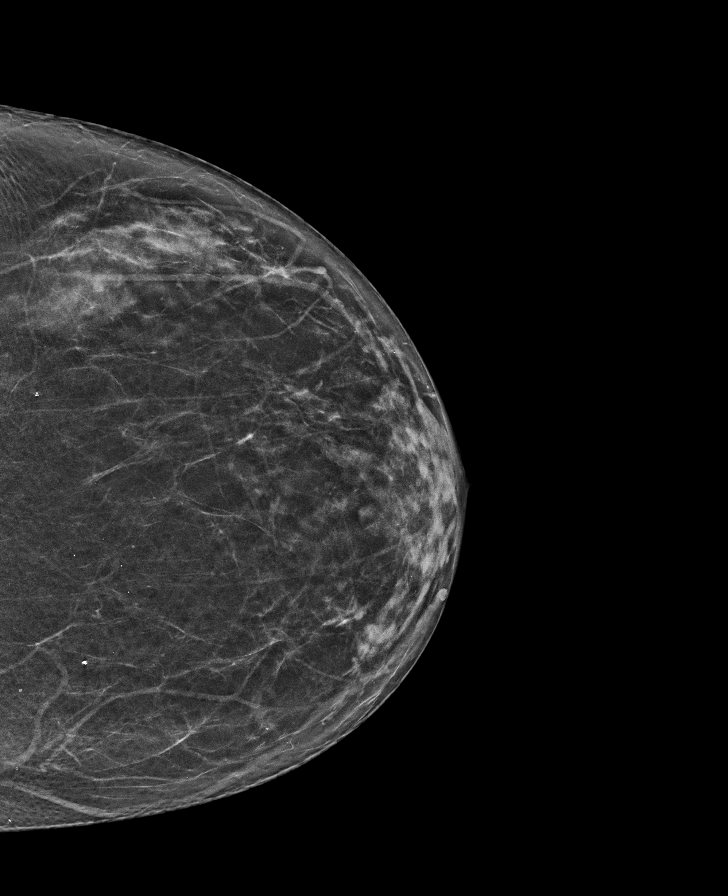

[R CC synth-2D]
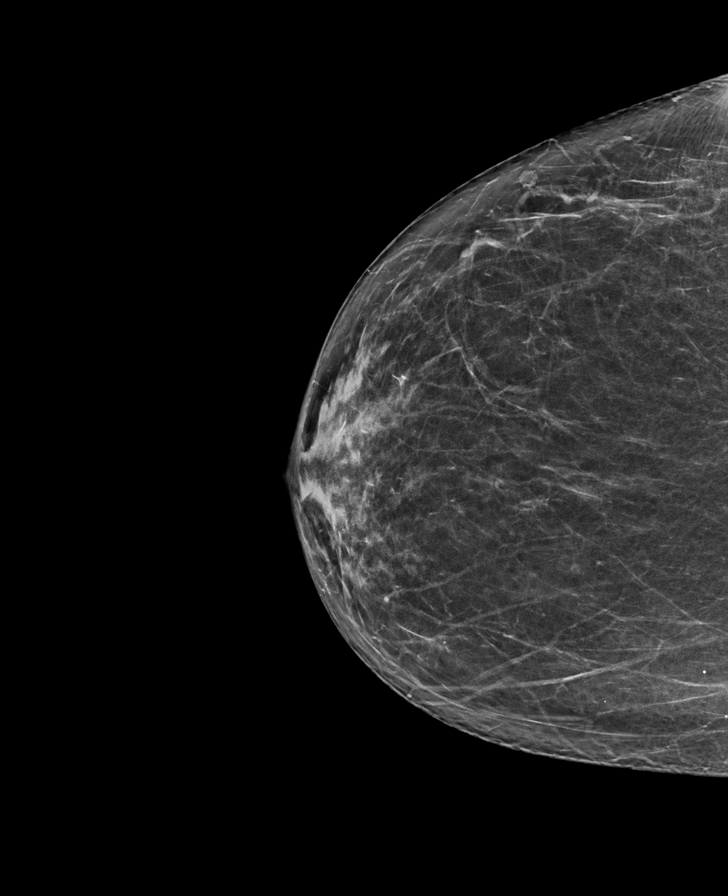

[R MLO synth-2D]
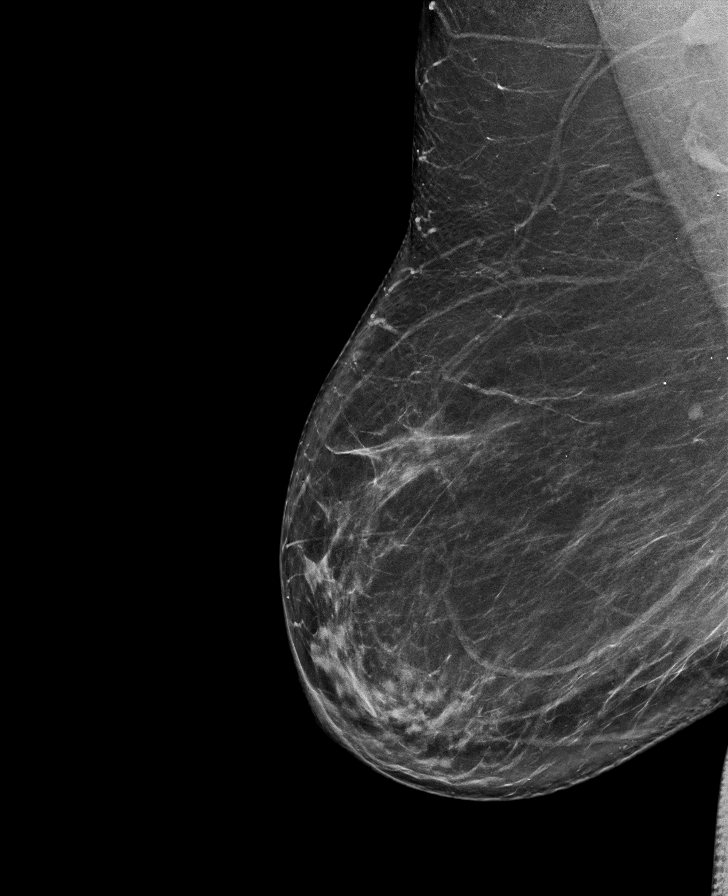

[L CC tomo · tomo slice 33/64.0]
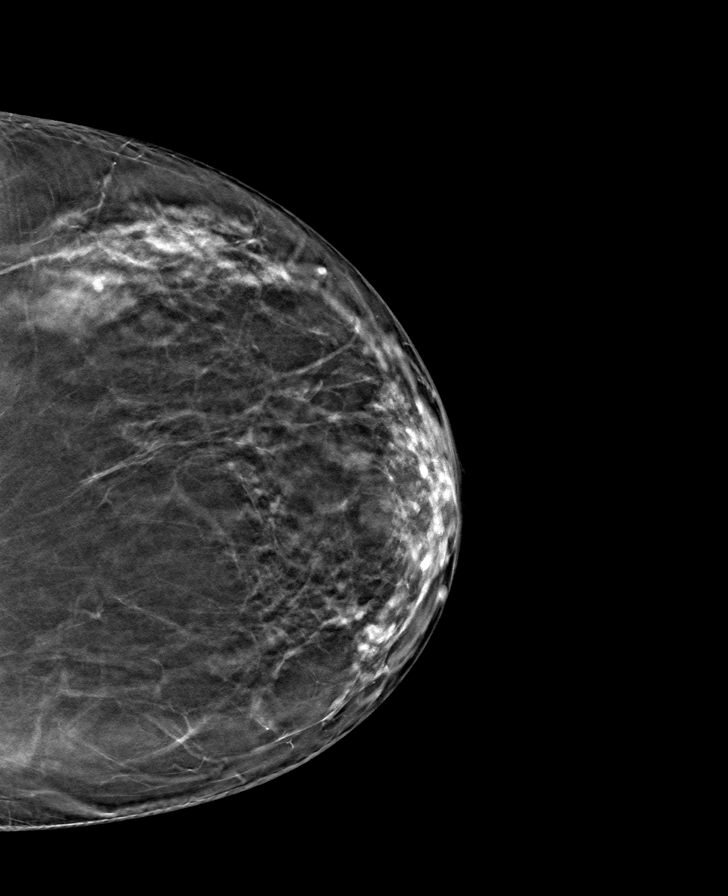

[R MLO tomo · tomo slice 45/89.0]
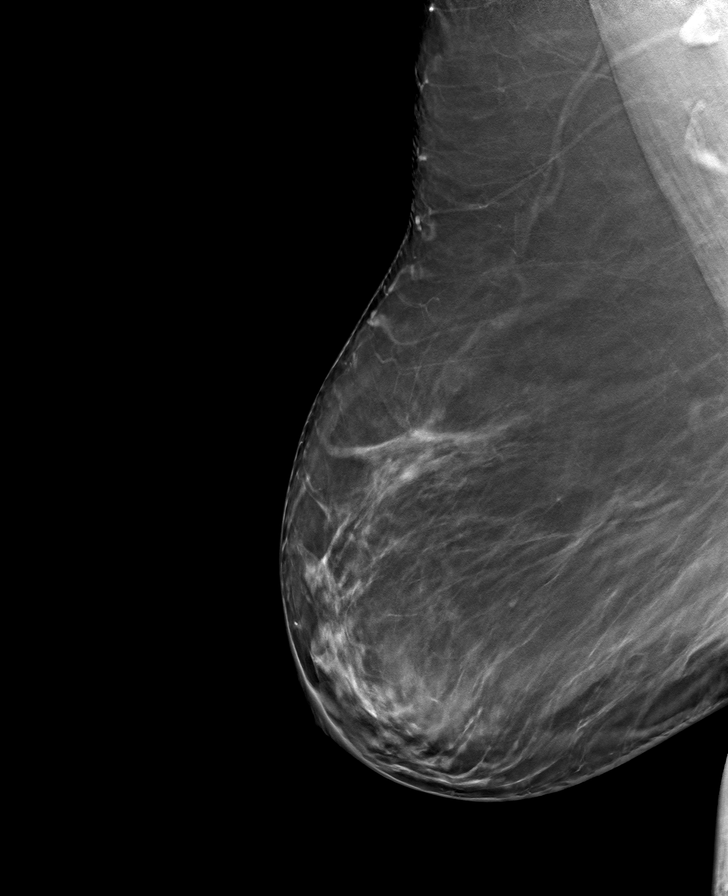

[L MLO tomo · tomo slice 45/90.0]
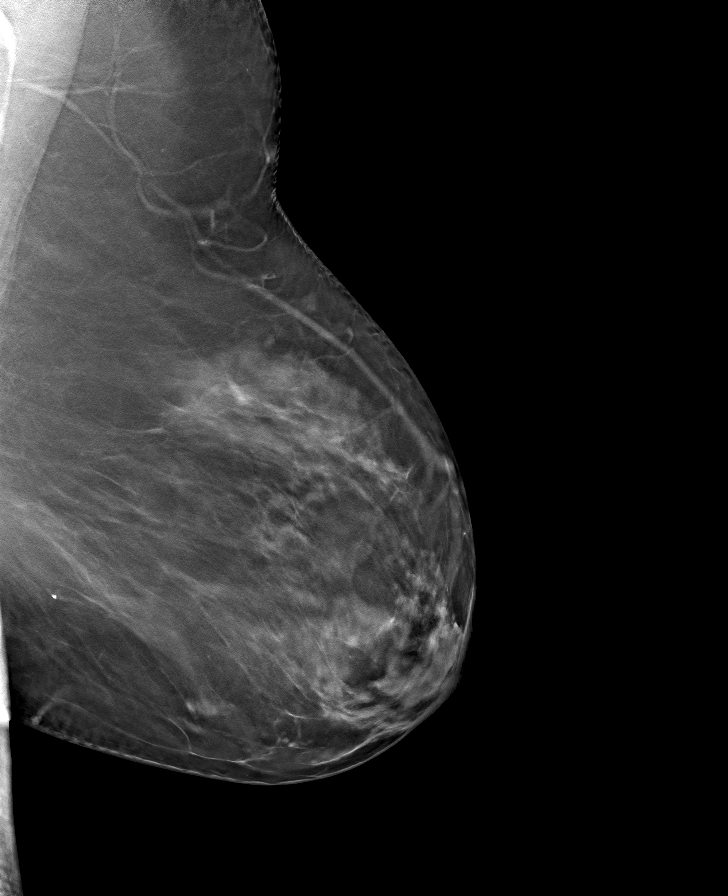

[R CC tomo · tomo slice 35/70.0]
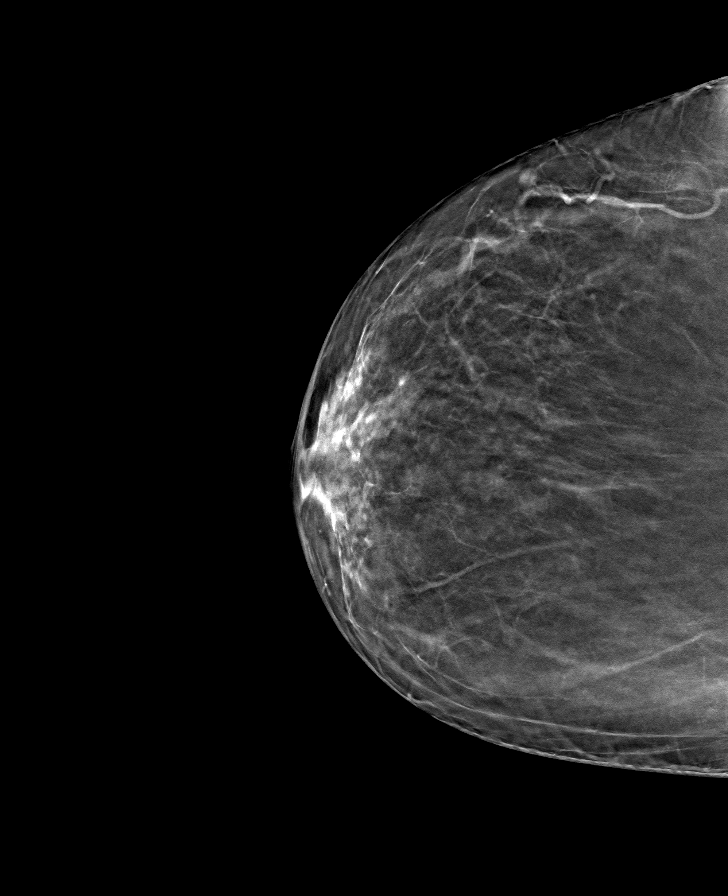

[8 of 24 positions shown; findings below may reference images not displayed]

ACR Breast Density Category b: There are scattered areas of
fibroglandular density.
FINDINGS: There are no findings suspicious for malignancy.
IMPRESSION: No mammographic evidence of malignancy. A result letter of this
screening mammogram will be mailed directly to the patient.

RECOMMENDATION:
Screening mammogram in one year. (Code:51-O-LD2)

BI-RADS CATEGORY  1: Negative.

## 2023-06-12 DIAGNOSIS — M25661 Stiffness of right knee, not elsewhere classified: Secondary | ICD-10-CM | POA: Diagnosis not present

## 2023-06-12 DIAGNOSIS — M6281 Muscle weakness (generalized): Secondary | ICD-10-CM | POA: Diagnosis not present

## 2023-06-12 DIAGNOSIS — R262 Difficulty in walking, not elsewhere classified: Secondary | ICD-10-CM | POA: Diagnosis not present

## 2023-06-14 DIAGNOSIS — M6281 Muscle weakness (generalized): Secondary | ICD-10-CM | POA: Diagnosis not present

## 2023-06-14 DIAGNOSIS — R262 Difficulty in walking, not elsewhere classified: Secondary | ICD-10-CM | POA: Diagnosis not present

## 2023-06-14 DIAGNOSIS — M25661 Stiffness of right knee, not elsewhere classified: Secondary | ICD-10-CM | POA: Diagnosis not present

## 2023-06-21 DIAGNOSIS — M25661 Stiffness of right knee, not elsewhere classified: Secondary | ICD-10-CM | POA: Diagnosis not present

## 2023-06-21 DIAGNOSIS — R262 Difficulty in walking, not elsewhere classified: Secondary | ICD-10-CM | POA: Diagnosis not present

## 2023-06-21 DIAGNOSIS — M6281 Muscle weakness (generalized): Secondary | ICD-10-CM | POA: Diagnosis not present

## 2023-06-26 DIAGNOSIS — M25561 Pain in right knee: Secondary | ICD-10-CM | POA: Diagnosis not present

## 2023-06-26 DIAGNOSIS — M25661 Stiffness of right knee, not elsewhere classified: Secondary | ICD-10-CM | POA: Diagnosis not present

## 2023-06-26 DIAGNOSIS — M6281 Muscle weakness (generalized): Secondary | ICD-10-CM | POA: Diagnosis not present

## 2023-06-26 DIAGNOSIS — R262 Difficulty in walking, not elsewhere classified: Secondary | ICD-10-CM | POA: Diagnosis not present

## 2023-06-28 DIAGNOSIS — M25661 Stiffness of right knee, not elsewhere classified: Secondary | ICD-10-CM | POA: Diagnosis not present

## 2023-06-28 DIAGNOSIS — R262 Difficulty in walking, not elsewhere classified: Secondary | ICD-10-CM | POA: Diagnosis not present

## 2023-06-28 DIAGNOSIS — M6281 Muscle weakness (generalized): Secondary | ICD-10-CM | POA: Diagnosis not present

## 2023-06-29 ENCOUNTER — Other Ambulatory Visit: Payer: Self-pay | Admitting: Family Medicine

## 2023-06-29 DIAGNOSIS — Z1231 Encounter for screening mammogram for malignant neoplasm of breast: Secondary | ICD-10-CM

## 2023-07-12 DIAGNOSIS — L820 Inflamed seborrheic keratosis: Secondary | ICD-10-CM | POA: Diagnosis not present

## 2023-07-12 DIAGNOSIS — L821 Other seborrheic keratosis: Secondary | ICD-10-CM | POA: Diagnosis not present

## 2023-07-12 DIAGNOSIS — D1801 Hemangioma of skin and subcutaneous tissue: Secondary | ICD-10-CM | POA: Diagnosis not present

## 2023-08-01 ENCOUNTER — Ambulatory Visit

## 2023-08-01 DIAGNOSIS — Z1231 Encounter for screening mammogram for malignant neoplasm of breast: Secondary | ICD-10-CM

## 2023-08-03 ENCOUNTER — Encounter: Payer: Self-pay | Admitting: Family Medicine

## 2023-08-03 NOTE — Progress Notes (Signed)
 Please call patient. Normal mammogram.  Repeat in 1 year.

## 2023-08-06 ENCOUNTER — Ambulatory Visit (INDEPENDENT_AMBULATORY_CARE_PROVIDER_SITE_OTHER): Payer: Medicare HMO | Admitting: Family Medicine

## 2023-08-06 VITALS — BP 121/58 | HR 80 | Ht 66.0 in | Wt 293.0 lb

## 2023-08-06 DIAGNOSIS — F339 Major depressive disorder, recurrent, unspecified: Secondary | ICD-10-CM

## 2023-08-06 DIAGNOSIS — R197 Diarrhea, unspecified: Secondary | ICD-10-CM | POA: Diagnosis not present

## 2023-08-06 DIAGNOSIS — G35 Multiple sclerosis: Secondary | ICD-10-CM | POA: Diagnosis not present

## 2023-08-06 DIAGNOSIS — Z7984 Long term (current) use of oral hypoglycemic drugs: Secondary | ICD-10-CM | POA: Diagnosis not present

## 2023-08-06 DIAGNOSIS — E118 Type 2 diabetes mellitus with unspecified complications: Secondary | ICD-10-CM

## 2023-08-06 DIAGNOSIS — I10 Essential (primary) hypertension: Secondary | ICD-10-CM | POA: Diagnosis not present

## 2023-08-06 LAB — POCT GLYCOSYLATED HEMOGLOBIN (HGB A1C): Hemoglobin A1C: 6.8 % — AB (ref 4.0–5.6)

## 2023-08-06 NOTE — Assessment & Plan Note (Signed)
 Well controlled. Continue current regimen. Follow up in  6 mo

## 2023-08-06 NOTE — Assessment & Plan Note (Signed)
 A1c looks great today at 6.8.  Great improvement from prior in January continue to work on [employer] and regular exercise and repeat again in 4 months

## 2023-08-06 NOTE — Assessment & Plan Note (Signed)
 Currently on copaxone.

## 2023-08-06 NOTE — Assessment & Plan Note (Signed)
 Happy with current dose of Lexapro .  Continue current regimen.  Follow-up in 4 to 6 months.

## 2023-08-06 NOTE — Progress Notes (Signed)
 Established Patient Office Visit  Subjective  Patient ID: Stephanie Perkins, female    DOB: 1960-12-28  Age: 63 y.o. MRN: [id number]  Chief Complaint  Patient presents with   Diabetes    HPI  Diabetes - no hypoglycemic events. No wounds or sores that are not healing well. No increased thirst or urination. Checking glucose at home. Taking medications as prescribed without any side effects.  Hypertension- Pt denies chest pain, SOB, dizziness, or heart palpitations.  Taking meds as directed w/o problems.  Denies medication side effects.    She also reports that she has been having occasional episodes of diarrhea, maybe every 2 weeks or so, for the last few months.  She has not been able to pinpoint any dietary triggers.  She has not had any medicine medication changes.  Her bowel movements in between are normal they are not hard or constipated.  She denies any fever or cramping or blood in her stools with these episodes.     ROS    Objective:     BP (!) 121/58   Pulse 80   Ht 5\' 6"  (1.676 m)   Wt 293 lb (132.9 kg)   SpO2 97%   BMI 47.29 kg/m    Physical Exam Vitals and nursing note reviewed.  Constitutional:      Appearance: Normal appearance.  HENT:     Head: Normocephalic and atraumatic.  Eyes:     Conjunctiva/sclera: Conjunctivae normal.  Cardiovascular:     Rate and Rhythm: Normal rate and regular rhythm.  Pulmonary:     Effort: Pulmonary effort is normal.     Breath sounds: Normal breath sounds.  Skin:    General: Skin is warm and dry.  Neurological:     Mental Status: She is alert.  Psychiatric:        Mood and Affect: Mood normal.     Results for orders placed or performed in visit on 08/06/23  POCT HgB A1C  Result Value Ref Range   Hemoglobin A1C 6.8 (A) 4.0 - 5.6 %   HbA1c POC (<> result, manual entry)     HbA1c, POC (prediabetic range)     HbA1c, POC (controlled diabetic range)        The 10-year ASCVD risk score (Arnett DK, et al., 2019)  is: 16.4%    Assessment & Plan:   Problem List Items Addressed This Visit       Cardiovascular and Mediastinum   HYPERTENSION, BENIGN - Primary   Well controlled. Continue current regimen. Follow up in  6 mo       Relevant Orders   CMP14+EGFR     Endocrine   Diabetes mellitus type 2 with complications (HCC)   A1c looks great today at 6.8.  Great improvement from prior in January continue to work on [employer] and regular exercise and repeat again in 4 months      Relevant Orders   POCT HgB A1C (Completed)   CMP14+EGFR     Nervous and Auditory   MULTIPLE SCLEROSIS   Currently on copaxone.         Other   Depression, recurrent (HCC)   Happy with current dose of Lexapro .  Continue current regimen.  Follow-up in 4 to 6 months.      Other Visit Diagnoses       Diarrhea, unspecified type          Intermittent diarrhea-discussed unclear etiology at this point it does not sound like colitis  or diverticulitis.  It seems very intermittent and she has not quite been able to pinpoint and she already avoids dairy predominantly.  We did discuss that if it continues am happy to refer her to GI at any point.  I just do not have a great explanation at this point.  Reminded to schedule her Tdap at the pharmacy.   Return in about 4 months (around 12/07/2023) for Hypertension, Diabetes follow-up.    Duaine German, MD

## 2023-08-07 ENCOUNTER — Encounter: Payer: Self-pay | Admitting: Family Medicine

## 2023-08-07 LAB — CMP14+EGFR
ALT: 19 IU/L (ref 0–32)
AST: 14 IU/L (ref 0–40)
Albumin: 4.4 g/dL (ref 3.9–4.9)
Alkaline Phosphatase: 81 IU/L (ref 44–121)
BUN/Creatinine Ratio: 19 (ref 12–28)
BUN: 19 mg/dL (ref 8–27)
Bilirubin Total: 0.3 mg/dL (ref 0.0–1.2)
CO2: 24 mmol/L (ref 20–29)
Calcium: 9.8 mg/dL (ref 8.7–10.3)
Chloride: 100 mmol/L (ref 96–106)
Creatinine, Ser: 1.01 mg/dL — ABNORMAL HIGH (ref 0.57–1.00)
Globulin, Total: 2.3 g/dL (ref 1.5–4.5)
Glucose: 104 mg/dL — ABNORMAL HIGH (ref 70–99)
Potassium: 4 mmol/L (ref 3.5–5.2)
Sodium: 140 mmol/L (ref 134–144)
Total Protein: 6.7 g/dL (ref 6.0–8.5)
eGFR: 63 mL/min/{1.73_m2} (ref >59)

## 2023-08-07 NOTE — Progress Notes (Signed)
 Your kidney function normally runs between 0.6 and 0.8 but this last time it was 1.0.  You do look a little bit more dry on your blood work so I would like to recheck that again in about 3 to 4 weeks and push some hydration.  Liver function look great.  Do get your Tdap updated at the pharmacy when you can the summer.

## 2023-11-19 DIAGNOSIS — H5213 Myopia, bilateral: Secondary | ICD-10-CM | POA: Diagnosis not present

## 2023-11-19 DIAGNOSIS — H524 Presbyopia: Secondary | ICD-10-CM | POA: Diagnosis not present

## 2023-11-19 LAB — HM DIABETES EYE EXAM

## 2023-12-04 ENCOUNTER — Encounter: Payer: Self-pay | Admitting: Sports Medicine

## 2023-12-06 ENCOUNTER — Ambulatory Visit: Admitting: Family Medicine

## 2023-12-06 ENCOUNTER — Encounter: Payer: Self-pay | Admitting: Family Medicine

## 2023-12-06 ENCOUNTER — Other Ambulatory Visit: Payer: Self-pay | Admitting: Family Medicine

## 2023-12-06 ENCOUNTER — Ambulatory Visit (INDEPENDENT_AMBULATORY_CARE_PROVIDER_SITE_OTHER)

## 2023-12-06 VITALS — BP 124/59 | HR 82 | Ht 66.0 in | Wt 249.0 lb

## 2023-12-06 DIAGNOSIS — M25552 Pain in left hip: Secondary | ICD-10-CM

## 2023-12-06 DIAGNOSIS — Z6841 Body Mass Index (BMI) 40.0 and over, adult: Secondary | ICD-10-CM

## 2023-12-06 DIAGNOSIS — M25551 Pain in right hip: Secondary | ICD-10-CM

## 2023-12-06 DIAGNOSIS — M47816 Spondylosis without myelopathy or radiculopathy, lumbar region: Secondary | ICD-10-CM | POA: Diagnosis not present

## 2023-12-06 DIAGNOSIS — M545 Low back pain, unspecified: Secondary | ICD-10-CM

## 2023-12-06 DIAGNOSIS — G8929 Other chronic pain: Secondary | ICD-10-CM | POA: Diagnosis not present

## 2023-12-06 DIAGNOSIS — E118 Type 2 diabetes mellitus with unspecified complications: Secondary | ICD-10-CM | POA: Diagnosis not present

## 2023-12-06 DIAGNOSIS — E785 Hyperlipidemia, unspecified: Secondary | ICD-10-CM

## 2023-12-06 DIAGNOSIS — Z1211 Encounter for screening for malignant neoplasm of colon: Secondary | ICD-10-CM | POA: Diagnosis not present

## 2023-12-06 DIAGNOSIS — E1129 Type 2 diabetes mellitus with other diabetic kidney complication: Secondary | ICD-10-CM | POA: Diagnosis not present

## 2023-12-06 DIAGNOSIS — I10 Essential (primary) hypertension: Secondary | ICD-10-CM

## 2023-12-06 LAB — POCT GLYCOSYLATED HEMOGLOBIN (HGB A1C): Hemoglobin A1C: 6.8 % — AB (ref 4.0–5.6)

## 2023-12-06 NOTE — Assessment & Plan Note (Signed)
 BP at goal today.

## 2023-12-06 NOTE — Progress Notes (Addendum)
 Established Patient Office Visit  Subjective  Patient ID: Stephanie Perkins, female    DOB: [birth year]  Age: 63 y.o. MRN: [id number]  Chief Complaint  Patient presents with   Diabetes   Hypertension   Back Pain   Knee Pain   Hip Pain    HPI Discussed the use of AI scribe software for clinical note transcription with the patient, who gave verbal consent to proceed.  History of Present Illness Stephanie Perkins is a 63 year old female who presents with back, hip, and knee pain.  Back, hip, and knee pain - Significant discomfort in the back, hips, and knees, with the back and hips being particularly bothersome - Persistent knee pain despite prior interventions - Right knee exhibits a 'popping' sensation when standing from a seated position, a new symptom since knee arthroscopy - Previous knee arthroscopy did not provide significant or lasting relief, despite expectations of up to eighteen months of benefit - Received various injections, including Ortho Visc, without lasting improvement - Both knees and hips remain painful - Uncertainty regarding which specialist to consult for ongoing joint pain - Desire to avoid returning to previous doctors - No recollection of prior imaging performed on the hips  Glycemic control and weight management - Hemoglobin A1c is 6.8, consistent with previous results - Blood sugar managed through dietary measures without medication - No unusual hyperglycemia or hypoglycemia episodes - Acknowledges need for weight loss and reduction of high-calorie snacks such as potato chips and chocolate  Preventive health maintenance - Recent eye examination completed, awaiting results      ROS    Objective:     BP (!) 124/59   Pulse 82   Ht 5' 6 (1.676 m)   Wt 249 lb (112.9 kg)   SpO2 96%   BMI 40.19 kg/m    Physical Exam Vitals and nursing note reviewed.  Constitutional:      Appearance: Normal appearance.  HENT:     Head: Normocephalic and  atraumatic.  Eyes:     Conjunctiva/sclera: Conjunctivae normal.  Cardiovascular:     Rate and Rhythm: Normal rate and regular rhythm.  Pulmonary:     Effort: Pulmonary effort is normal.     Breath sounds: Normal breath sounds.  Skin:    General: Skin is warm and dry.  Neurological:     Mental Status: She is alert.  Psychiatric:        Mood and Affect: Mood normal.      Results for orders placed or performed in visit on 12/06/23  Lipid panel  Result Value Ref Range   Cholesterol, Total 204 (H) 100 - 199 mg/dL   Triglycerides 765 (H) 0 - 149 mg/dL   HDL 44 >60 mg/dL   VLDL Cholesterol Cal 41 (H) 5 - 40 mg/dL   LDL Chol Calc (NIH) 880 (H) 0 - 99 mg/dL   Chol/HDL Ratio 4.6 (H) 0.0 - 4.4 ratio  CMP14+EGFR  Result Value Ref Range   Glucose 97 70 - 99 mg/dL   BUN 19 8 - 27 mg/dL   Creatinine, Ser 8.95 (H) 0.57 - 1.00 mg/dL   eGFR 60 >40 fO/fpw/8.26   BUN/Creatinine Ratio 18 12 - 28   Sodium 142 134 - 144 mmol/L   Potassium 3.9 3.5 - 5.2 mmol/L   Chloride 101 96 - 106 mmol/L   CO2 24 20 - 29 mmol/L   Calcium  10.3 8.7 - 10.3 mg/dL   Total Protein 7.1 6.0 - 8.5  g/dL   Albumin 4.6 3.9 - 4.9 g/dL   Globulin, Total 2.5 1.5 - 4.5 g/dL   Bilirubin Total 0.3 0.0 - 1.2 mg/dL   Alkaline Phosphatase 70 44 - 121 IU/L   AST 18 0 - 40 IU/L   ALT 26 0 - 32 IU/L  Urine Microalbumin w/creat. ratio  Result Value Ref Range   Creatinine, Urine 132.8 Not Estab. mg/dL   Microalbumin, Urine 47.0 Not Estab. ug/mL   Microalb/Creat Ratio 40 (H) 0 - 29 mg/g creat  POCT HgB A1C  Result Value Ref Range   Hemoglobin A1C 6.8 (A) 4.0 - 5.6 %   HbA1c POC (<> result, manual entry)     HbA1c, POC (prediabetic range)     HbA1c, POC (controlled diabetic range)        The 10-year ASCVD risk score (Arnett DK, et al., 2019) is: 17.2%    Assessment & Plan:   Problem List Items Addressed This Visit       Cardiovascular and Mediastinum   HYPERTENSION, BENIGN - Primary   BP at goal today.        Relevant Orders   Lipid panel (Completed)   CMP14+EGFR (Completed)     Endocrine   Diabetes mellitus with microalbuminuria (HCC)   Type 2 diabetes mellitus, diet controlled Type 2 diabetes mellitus managed with diet. A1c is 6.8, consistent with previous results. Advised to aim for an A1c closer to 6.5. Weight loss and dietary modifications recommended to improve insulin resistance. - Encourage dietary modifications to reduce carbohydrate and sugar intake. - Monitor A1c and blood glucose levels regularly. - Discuss potential need for medication if A1c increases.        Other   Morbid obesity with body mass index of 40.0-44.9 in adult [facility])   Continue to work on healthy diet and weight loss and better A1C reduction.       Hyperlipidemia   Due for updated lipid panel. Goal LDL under 100.      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 Sacroiliac joints show arthritis, more pronounced on the left. Symptoms suggest degenerative changes consistent with lumbar spondylosis and sacroiliac joint arthritis. Non-surgical management preferred. - Order updated imaging of the lumbar spine. - Refer to Dr. Redell Boron for non-surgical management options.  Bilateral knee pain with osteoarthritis, status post right knee arthroscopy Bilateral knee pain  with osteoarthritis and previous right knee arthroscopy. Persistent pain and popping in the right knee post-arthroscopy suggest ongoing degenerative changes or mechanical issues. Previous treatments included various injections with limited relief. - Refer to Dr. Redell Boron for evaluation and management.  Bilateral hip pain, possible osteoarthritis Bilateral hip pain  with suspicion of osteoarthritis. No recent imaging available, making it difficult to assess degenerative changes. Non-surgical management preferred. - Order imaging of the hips. - Refer to Dr. Redell Boron for further evaluation and management.      Return in about 4 months (around 04/06/2024) for Diabetes follow-up.    Dorothyann Byars, MD

## 2023-12-07 ENCOUNTER — Ambulatory Visit: Payer: Self-pay | Admitting: Family Medicine

## 2023-12-07 DIAGNOSIS — E118 Type 2 diabetes mellitus with unspecified complications: Secondary | ICD-10-CM | POA: Diagnosis not present

## 2023-12-07 LAB — CMP14+EGFR
ALT: 26 IU/L (ref 0–32)
AST: 18 IU/L (ref 0–40)
Albumin: 4.6 g/dL (ref 3.9–4.9)
Alkaline Phosphatase: 70 IU/L (ref 44–121)
BUN/Creatinine Ratio: 18 (ref 12–28)
BUN: 19 mg/dL (ref 8–27)
Bilirubin Total: 0.3 mg/dL (ref 0.0–1.2)
CO2: 24 mmol/L (ref 20–29)
Calcium: 10.3 mg/dL (ref 8.7–10.3)
Chloride: 101 mmol/L (ref 96–106)
Creatinine, Ser: 1.04 mg/dL — ABNORMAL HIGH (ref 0.57–1.00)
Globulin, Total: 2.5 g/dL (ref 1.5–4.5)
Glucose: 97 mg/dL (ref 70–99)
Potassium: 3.9 mmol/L (ref 3.5–5.2)
Sodium: 142 mmol/L (ref 134–144)
Total Protein: 7.1 g/dL (ref 6.0–8.5)
eGFR: 60 mL/min/1.73 (ref >59)

## 2023-12-07 LAB — LIPID PANEL
Chol/HDL Ratio: 4.6 ratio — ABNORMAL HIGH (ref 0.0–4.4)
Cholesterol, Total: 204 mg/dL — ABNORMAL HIGH (ref 100–199)
HDL: 44 mg/dL (ref >39)
LDL Chol Calc (NIH): 119 mg/dL — ABNORMAL HIGH (ref 0–99)
Triglycerides: 234 mg/dL — ABNORMAL HIGH (ref 0–149)
VLDL Cholesterol Cal: 41 mg/dL — ABNORMAL HIGH (ref 5–40)

## 2023-12-07 NOTE — Telephone Encounter (Signed)
 HI cheryl, do you think she would be a candidate to add benzoic acid (Nexlizet)  with her statin?  I noticed you got it approved for my other patients earlier this week.  I had tried to write it and get it approved about a month ago for another patient and it got denied.

## 2023-12-07 NOTE — Assessment & Plan Note (Signed)
 Due for updated lipid panel. Goal LDL under 100.

## 2023-12-07 NOTE — Progress Notes (Signed)
 HI Luvenia, your LDL cholesterol ismuch better than last time but not quite at goal. Are you taking the atorv 80mg  nightly?  If yes then we can consider adding Zetia  to get your LDL down under 100. Kidney function slighly up from your baseline but similar to 4 months ago.  Stable. You do look a little dry on your labs. Getting glucose down will also help with lower cholesterol. We could considering trying a different diabetic medication.

## 2023-12-07 NOTE — Assessment & Plan Note (Signed)
 Type 2 diabetes mellitus, diet controlled Type 2 diabetes mellitus managed with diet. A1c is 6.8, consistent with previous results. Advised to aim for an A1c closer to 6.5. Weight loss and dietary modifications recommended to improve insulin resistance. - Encourage dietary modifications to reduce carbohydrate and sugar intake. - Monitor A1c and blood glucose levels regularly. - Discuss potential need for medication if A1c increases.

## 2023-12-08 LAB — MICROALBUMIN / CREATININE URINE RATIO
Creatinine, Urine: 132.8 mg/dL
Microalb/Creat Ratio: 40 mg/g{creat} — ABNORMAL HIGH (ref 0–29)
Microalbumin, Urine: 52.9 ug/mL

## 2023-12-10 ENCOUNTER — Encounter: Payer: Self-pay | Admitting: Family Medicine

## 2023-12-10 NOTE — Progress Notes (Signed)
 Protein level in the urine is just slightly elevated will keep a close eye on this.  You do take omelsartan and that does help protect the kidneys.

## 2023-12-10 NOTE — Assessment & Plan Note (Signed)
 Continue to work on healthy diet and weight loss and better A1C reduction.

## 2023-12-11 ENCOUNTER — Ambulatory Visit: Admitting: Sports Medicine

## 2023-12-11 ENCOUNTER — Telehealth: Payer: Self-pay

## 2023-12-11 ENCOUNTER — Encounter: Payer: Self-pay | Admitting: Sports Medicine

## 2023-12-11 VITALS — BP 130/78 | Ht 66.0 in | Wt 249.0 lb

## 2023-12-11 DIAGNOSIS — G8929 Other chronic pain: Secondary | ICD-10-CM

## 2023-12-11 DIAGNOSIS — M25561 Pain in right knee: Secondary | ICD-10-CM | POA: Diagnosis not present

## 2023-12-11 DIAGNOSIS — M25562 Pain in left knee: Secondary | ICD-10-CM | POA: Diagnosis not present

## 2023-12-11 MED ORDER — NEXLETOL 180 MG PO TABS: 1.0000 | ORAL_TABLET | Freq: Every day | ORAL | 1 refills | Status: DC

## 2023-12-11 NOTE — Progress Notes (Signed)
   Subjective:    Patient ID: Stephanie Perkins, female    DOB: Oct 17, 1960, 62 y.o.   MRN: [id number]  HPI chief complaint: Right knee pain  Patient is a very pleasant 63 year old female that presents today with chronic right knee pain.  She has had several different treatments in the past.  These have included cortisone injections, viscosupplementation, and arthroscopic surgery.  She continues to have pain that she localizes primarily behind the right patella.  She does have pain in the left knee as well.  In addition, she is describing pain in her lateral hips and low back.  She has had low back pain for many years.  Recent x-rays of her hips show only minimal degenerative changes.  X-rays of her lumbar spine show moderate amount of disc space narrowing at L5-S1.  She does find some relief with topical Voltaren  gel.  She also takes Advil on occasion.    Review of Systems As above    Objective:   Physical Exam  Well-developed, well-nourished.  No acute distress  Examination of both hips shows smooth painless hip range of motion with a negative logroll.  She does have diffuse tenderness to palpation along the lateral hips.  Examination of both knees shows good range of motion.  No effusion.  1+ patellofemoral crepitus.  Slight tenderness to palpation along medial joint lines bilaterally.  Good ligamentous stability.      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  I do not think she is interested in thinking about any other type of knee surgery at this time.  I think she may be a candidate for bilateral genicular nerve blocks.  I will refer her to Dr.Boies to discuss this further.  She may also benefit from physical therapy for her chronic greater trochanteric pain syndrome but we will defer that for now until her knees are feeling better.  This note was dictated using  Dragon naturally speaking software and may contain errors in syntax, spelling, or content which have not been identified prior to signing this note.

## 2023-12-11 NOTE — Progress Notes (Signed)
   12/11/2023  Patient ID: Stephanie Perkins, female   DOB: 1961-01-20, 63 y.o.   MRN: [id number]  Clinic routed request from patient's PCP, Dr. Alvan, to assist with coverage for Nexletol  180mg  daily.  Patient is currently taking max daily dose of atorvastatin  (80mg ) for hyperlipidemia, but LDL remains elevated at 119 as of 12/06/2023.  Patient has tried ezetimibe  and rosuvastatin  in the past and experience extreme fatigue and myalgias.  Prior authorization request was submitted to [facility] via CoverMyMeds, and this has now been approved.  Order pending for PCP to sign if in agreement.  I will advise patient via MyChart message and ask that she contact me if copay will not be affordable- we can look into patient assistance if needed.  Channing DELENA Mealing, PharmD, DPLA

## 2023-12-12 ENCOUNTER — Telehealth: Payer: Self-pay

## 2023-12-12 NOTE — Telephone Encounter (Signed)
 Pharmacy Patient Advocate Encounter  Received notification from [employer] that Prior Authorization for Nexletol  180mg  tabs has been APPROVED from 12/11/23 to 04/02/24   Approval letter indexed to media tab

## 2023-12-17 LAB — HM DIABETES EYE EXAM

## 2023-12-18 ENCOUNTER — Ambulatory Visit: Payer: Self-pay | Admitting: Family Medicine

## 2023-12-18 NOTE — Progress Notes (Signed)
 Hi Stephanie Perkins, x-ray of your lumbar spine shows some mild degenerative changes or arthritis.  There is a little disc space narrowing particularly at L5-S1.  Nothing severe no sign of fracture.  Neck step would be to consider formal physical therapy for your low back.  The other option is we can get you connected with one of our sports med docs.  I think we did place the referral when I saw you the other day.

## 2023-12-18 NOTE — Progress Notes (Signed)
 Hi Chanette, x-rays of the hips actually look pretty good no significant arthropathy or arthritis there.

## 2023-12-19 DIAGNOSIS — Z1211 Encounter for screening for malignant neoplasm of colon: Secondary | ICD-10-CM | POA: Diagnosis not present

## 2023-12-26 LAB — COLOGUARD: COLOGUARD: NEGATIVE

## 2023-12-26 NOTE — Progress Notes (Signed)
 Great news! Your Cologuard test is negative.  Recommend repeat colon cancer screening in 3 years.

## 2023-12-28 ENCOUNTER — Other Ambulatory Visit: Payer: Self-pay

## 2023-12-28 ENCOUNTER — Ambulatory Visit: Admitting: Pain Medicine

## 2023-12-28 VITALS — BP 142/66 | Ht 66.0 in | Wt 240.0 lb

## 2023-12-28 DIAGNOSIS — M25561 Pain in right knee: Secondary | ICD-10-CM

## 2023-12-28 DIAGNOSIS — G8929 Other chronic pain: Secondary | ICD-10-CM

## 2023-12-28 MED ORDER — METHYLPREDNISOLONE ACETATE 40 MG/ML IJ SUSP: 40.0000 mg | Freq: Once | INTRAMUSCULAR | Status: AC

## 2023-12-28 MED ORDER — METHYLPREDNISOLONE ACETATE 80 MG/ML IJ SUSP: 80.0000 mg | Freq: Once | INTRAMUSCULAR | Status: AC

## 2023-12-28 NOTE — Progress Notes (Deleted)
 I have seen and evaluated the patient with Dr. Romelle and agree with the plan.  Will see patient back after right knee genicular blocks in 2 weeks.

## 2023-12-28 NOTE — Progress Notes (Signed)
 PCP: Alvan Dorothyann BIRCH, MD  Subjective:   HPI: Patient is a 63 y.o. female here for bilateral knee pain, R>L. She was referred by Dr. Arvell to consider geniculate nerve block trial in her right knee. Pain is primarily around the kneecap and superolateral to kneecap. Has been ongoing for about 2 years, nonradiating, constant aching with morning stiffness and worsening with movement throughout the day. Occasional swelling. Minimal relief with advil and voltaren  gel. She has also tried cortisone injections, viscosupplementation, and arthroscopic surgery of the right knee earlier this year without much relief.     Past Medical History:  Diagnosis Date   Allergy 1966   Took allergy shots until 63 years old. I have had allergic reactions to medication etc   Anxiety ?   Depression    Diabetes mellitus with microalbuminuria (HCC) 05/21/2009   Qualifier: Diagnosis of  By: Alvan MD, Catherine     Hyperlipidemia    Hypertension    Multiple sclerosis    Neuromuscular disorder (HCC) 1998   MS   Vision abnormalities     Current Outpatient Medications on File Prior to Visit  Medication Sig Dispense Refill   atorvastatin  (LIPITOR) 80 MG tablet TAKE 1 TABLET AT BEDTIME 90 tablet 3   Bempedoic Acid  (NEXLETOL ) 180 MG TABS Take 1 tablet (180 mg total) by mouth daily. 90 tablet 1   Blood Glucose Monitoring Suppl (TRUE METRIX AIR GLUCOSE METER) w/Device KIT For testing blood sugars once daily E11.8 1 kit 0   COPAXONE 40 MG/ML SOSY Inject into the skin. Three times a week     cyanocobalamin  (VITAMIN B12) 1000 MCG/ML injection Inject 1 mL (1,000 mcg total) into the muscle every 30 (thirty) days. 4 mL 3   escitalopram  (LEXAPRO ) 10 MG tablet TAKE 1 TABLET EVERY DAY 90 tablet 3   modafinil (PROVIGIL) 200 MG tablet Take 200 mg by mouth 2 (two) times daily.     Olmesartan -amLODIPine -HCTZ 40-10-25 MG TABS TAKE 1 TABLET EVERY DAY 90 tablet 3   TRUE METRIX BLOOD GLUCOSE TEST test strip For testing blood  sugars once daily. E11.8 100 each 5   TRUEplus Lancets 33G MISC For testing blood sugars once daily. E11.8 100 each 5   Vitamin D, Ergocalciferol, (DRISDOL) 1.25 MG (50000 UNIT) CAPS capsule Take 50,000 Units by mouth 2 (two) times a week. Taking Saturdays & Wednesdays     No current facility-administered medications on file prior to visit.    Past Surgical History:  Procedure Laterality Date   ABDOMINAL HYSTERECTOMY  2008   APPENDECTOMY  04/1997   CESAREAN SECTION  12/1983   IR RADIOLOGIST EVAL & MGMT  03/19/2023   TOTAL VAGINAL HYSTERECTOMY  03/2007   for fibroids, Dr. Leva   TUBAL LIGATION  2002    Allergies  Allergen Reactions   Crestor  [Rosuvastatin ] Other (See Comments)    FAtigue and myalgia   Januvia  [Sitagliptin ] Swelling   Metformin  And Related Other (See Comments)    Diarrrhea   Ocrevus [Ocrelizumab] Diarrhea    Severed diarrhea   Penicillins     REACTION: hives   Zetia  [Ezetimibe ] Other (See Comments)    Extreme fatigue    BP (!) 142/66   Ht 5' 6 (1.676 m)   Wt 240 lb (108.9 kg)   BMI 38.74 kg/m       No data to display              No data to display  Objective:  Physical Exam:  Gen: NAD, comfortable in exam room  Bilateral knees: Inspection: no gross deformity or ecchymosis. Palpable knee effusion present bilaterally. Palpation: Mildly tender to palpation throughout the joint line at the medial and lateral aspects bilaterally, more tender in right knee than left ROM: FROM bilaterally with no popping or locking Strength: 5/5 b/l Special tests: +Apley's on the right   Assessment & Plan:  1. Chronic knee pain, R>L Suspect 2/2 OA, also has history of meniscal tear surgically repaired earlier this year Refractory to CSI and viscosupplementation, minimal response to oral and topical NSAIDs Feel patient would benefit from trial of geniculate nerve block in the right knee, which was performed today under ultrasound guidance.  See procedure note by Dr. Renita. Follow up within 1 week to evaluate her response especially within first 6-8 hours of injection. Discussed recommendations regarding activity levels and supportive care

## 2023-12-28 NOTE — Progress Notes (Signed)
 PRE-OP DIAGNOSIS: Right knee pain PROCEDURE: Right knee genicular nerve blocks under US  guidance Performing Physician: Redell Reid, MD   Total Dose:       bupivacaine 0.5%      3  mL     R/B/A of procedure provided.  Risks of infection, bleeding, damage to other structures and nerves explained.    Procedure: The area was prepped in the usual sterile manner with chlorhexidine. Sterile ultrasound gel was applied.  Ultrasound was used to identify the femoral shaft and condyles, along with the tibia shaft and head on the side noted above.  The inflection points were noted at the areas of the superior medial, superior lateral, and inferomedial genicular nerves.  A 25g 3.5 inch Quinky spinal needle was inserted through the skin and to the area of the nerves.  After negative aspiration, 1 ml of the above solution was injected at each of the 3 sites above.   There were no complications during this procedure.   Patient reported relief upon standing after the procedure.   Followup: The patient tolerated the procedure well without complications.  Standard post-procedure care is explained and return precautions are given.

## 2024-01-02 ENCOUNTER — Other Ambulatory Visit: Payer: Self-pay | Admitting: Family Medicine

## 2024-01-02 DIAGNOSIS — E538 Deficiency of other specified B group vitamins: Secondary | ICD-10-CM

## 2024-01-02 DIAGNOSIS — G35D Multiple sclerosis, unspecified: Secondary | ICD-10-CM

## 2024-01-02 DIAGNOSIS — E785 Hyperlipidemia, unspecified: Secondary | ICD-10-CM

## 2024-01-02 DIAGNOSIS — R350 Frequency of micturition: Secondary | ICD-10-CM

## 2024-01-02 DIAGNOSIS — I10 Essential (primary) hypertension: Secondary | ICD-10-CM

## 2024-01-04 NOTE — Progress Notes (Signed)
 Stephanie Perkins                                          MRN: [id number]   01/04/2024   The [facility] Quality Team Specialist reviewed this patient medical record for the purposes of chart review for care gap closure. The following were reviewed: abstraction for care gap closure-kidney health evaluation for diabetes:eGFR  and uACR.    [facility]BCI Quality Team

## 2024-01-28 NOTE — Progress Notes (Signed)
 Stephanie Perkins                                          MRN: [id number]   01/28/2024   The [facility] Quality Team Specialist reviewed this patient medical record for the purposes of chart review for care gap closure. The following were reviewed: chart review for care gap closure-controlling blood pressure.    [facility]BCI Quality Team

## 2024-02-05 ENCOUNTER — Ambulatory Visit

## 2024-03-26 ENCOUNTER — Other Ambulatory Visit: Payer: Self-pay | Admitting: Family Medicine

## 2024-03-26 DIAGNOSIS — F339 Major depressive disorder, recurrent, unspecified: Secondary | ICD-10-CM

## 2024-03-26 DIAGNOSIS — E118 Type 2 diabetes mellitus with unspecified complications: Secondary | ICD-10-CM

## 2024-03-26 DIAGNOSIS — I10 Essential (primary) hypertension: Secondary | ICD-10-CM

## 2024-03-28 ENCOUNTER — Other Ambulatory Visit (HOSPITAL_COMMUNITY): Payer: Self-pay

## 2024-03-28 ENCOUNTER — Telehealth: Payer: Self-pay | Admitting: Pharmacy Technician

## 2024-03-28 NOTE — Telephone Encounter (Signed)
 Pharmacy Patient Advocate Encounter  Received notification from [employer] that Prior Authorization for Nexletol  180MG  tablets has been APPROVED from 01/02/2024 to 04/02/2025.   PA #/Case ID/Reference #: [id number]

## 2024-03-31 ENCOUNTER — Other Ambulatory Visit: Payer: Self-pay | Admitting: Family Medicine

## 2024-04-08 ENCOUNTER — Ambulatory Visit: Admitting: Family Medicine

## 2024-04-14 ENCOUNTER — Ambulatory Visit: Admitting: Family Medicine

## 2024-04-14 ENCOUNTER — Encounter: Payer: Self-pay | Admitting: Family Medicine

## 2024-04-14 VITALS — BP 132/64 | HR 83 | Ht 63.0 in | Wt 234.0 lb

## 2024-04-14 DIAGNOSIS — M25561 Pain in right knee: Secondary | ICD-10-CM

## 2024-04-14 DIAGNOSIS — I10 Essential (primary) hypertension: Secondary | ICD-10-CM | POA: Diagnosis not present

## 2024-04-14 DIAGNOSIS — G8929 Other chronic pain: Secondary | ICD-10-CM

## 2024-04-14 DIAGNOSIS — R809 Proteinuria, unspecified: Secondary | ICD-10-CM

## 2024-04-14 DIAGNOSIS — Z23 Encounter for immunization: Secondary | ICD-10-CM | POA: Diagnosis not present

## 2024-04-14 DIAGNOSIS — E1129 Type 2 diabetes mellitus with other diabetic kidney complication: Secondary | ICD-10-CM

## 2024-04-14 DIAGNOSIS — E785 Hyperlipidemia, unspecified: Secondary | ICD-10-CM | POA: Diagnosis not present

## 2024-04-14 LAB — POCT GLYCOSYLATED HEMOGLOBIN (HGB A1C): Hemoglobin A1C: 6.7 % — AB (ref 4.0–5.6)

## 2024-04-14 NOTE — Progress Notes (Signed)
 "  Established Patient Office Visit  Patient ID: Stephanie Perkins, female    DOB: November 13, 1960  Age: 64 y.o. MRN: [id number] PCP: Alvan Dorothyann BIRCH, MD  Chief Complaint  Patient presents with   Diabetes   Hypertension    Subjective:     HPI  Discussed Stephanie use of AI scribe software for clinical note transcription with Stephanie patient, who gave verbal consent to proceed.  History of Present Illness Stephanie Perkins is a 64 year old female who presents with knee pain and medication management.  Knee pain - Chronic knee pain persists despite interventions. - Recently underwent a nerve block procedure with injection of a numbing agent; anesthesia lasted 12 hours, followed by return of intense pain. - Procedure was painful and provided only temporary relief. - Previous steroid injections were less painful due to lidocaine use. - Currently manages pain with Advil.  Dietary and weight management - Following a Mediterranean diet, resulting in a 5-pound weight loss over Stephanie past year. - Difficulty adhering to Stephanie diet due to infrequent cooking.  Blood pressure monitoring - Occasionally checks blood pressure at home. - No recent elevated readings.  Cardiopulmonary and psychiatric symptoms - No chest pain. - No shortness of breath. - No mood disturbances.  Medication management and gastrointestinal symptoms - Taking Nexletol  for Stephanie past four months. - Uses Colace nightly for constipation, which may be exacerbated by Nexletol . - Current regimen effectively manages constipation.  Anesthetic response - History of anesthesia wearing off quickly, attributed to red hair. - Increased pain sensitivity during dental procedures and past surgeries due to rapid loss of anesthetic effect.     ROS    Objective:     BP 132/64   Pulse 83   Ht 5' 3 (1.6 m)   Wt 234 lb (106.1 kg)   SpO2 97%   BMI 41.45 kg/m    Physical Exam Vitals and nursing note reviewed.  Constitutional:       Appearance: Normal appearance.  HENT:     Head: Normocephalic and atraumatic.  Eyes:     Conjunctiva/sclera: Conjunctivae normal.  Cardiovascular:     Rate and Rhythm: Normal rate and regular rhythm.  Pulmonary:     Effort: Pulmonary effort is normal.     Breath sounds: Normal breath sounds.  Skin:    General: Skin is warm and dry.  Neurological:     Mental Status: She is alert.  Psychiatric:        Mood and Affect: Mood normal.      Results for orders placed or performed in visit on 04/14/24  POCT HgB A1C  Result Value Ref Range   Hemoglobin A1C 6.7 (A) 4.0 - 5.6 %   HbA1c POC (<> result, manual entry)     HbA1c, POC (prediabetic range)     HbA1c, POC (controlled diabetic range)        Stephanie 10-year ASCVD risk score (Arnett DK, et al., 2019) is: 20.4%    Assessment & Plan:   Problem List Items Addressed This Visit       Cardiovascular and Mediastinum   HYPERTENSION, BENIGN   Essential hypertension Blood pressure at 138/59 mmHg. No concerning symptoms. Occasional home monitoring without alarming readings. - Encouraged regular home blood pressure monitoring.      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 No hyperglycemia or hypoglycemia symptoms. Weight loss noted, likely dietary. - Continue current diabetes management regimen. - Encouraged continuation of Mediterranean diet.       Relevant Orders   POCT HgB A1C (Completed)   CMP14+EGFR   Lipid panel     Other   Hyperlipidemia   Hyperlipidemia Started on Nexletol . Constipation reported, managed with Colace. - Continue Nexletol . - Continue Colace as needed for constipation. - Ordered repeat CMP and lipid panel.      Other Visit Diagnoses       Encounter for immunization    -  Primary   Relevant Orders   Flu vaccine trivalent PF, 6mos and  older(Flulaval,Afluria,Fluarix,Fluzone) (Completed)     Chronic pain of right knee           Assessment and Plan Assessment & Plan  Chronic right knee pain Evaluating for nerve ablation. Temporary relief from numbing medication trial. Discussed nerve ablation's temporary nature and potential for repeat procedures. - Consider nerve ablation if pain persists and she is willing.  General Health Maintenance Discussed flu vaccination. Missed October appointment. - Administered flu vaccine.    Return in about 4 months (around 08/12/2024) for Diabetes follow-up.    Dorothyann Byars, MD [facility] at [facility]   "

## 2024-04-14 NOTE — Assessment & Plan Note (Signed)
 Essential hypertension Blood pressure at 138/59 mmHg. No concerning symptoms. Occasional home monitoring without alarming readings. - Encouraged regular home blood pressure monitoring.

## 2024-04-14 NOTE — Assessment & Plan Note (Signed)
 Hyperlipidemia Started on Nexletol . Constipation reported, managed with Colace. - Continue Nexletol . - Continue Colace as needed for constipation. - Ordered repeat CMP and lipid panel.

## 2024-04-14 NOTE — Assessment & Plan Note (Signed)
 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 No hyperglycemia or hypoglycemia symptoms. Weight loss noted, likely dietary. - Continue current diabetes management regimen. - Encouraged continuation of Mediterranean diet.

## 2024-04-15 ENCOUNTER — Ambulatory Visit: Payer: Self-pay | Admitting: Family Medicine

## 2024-04-15 LAB — CMP14+EGFR
ALT: 27 IU/L (ref 0–32)
AST: 28 IU/L (ref 0–40)
Albumin: 4.8 g/dL (ref 3.9–4.9)
Alkaline Phosphatase: 46 IU/L — ABNORMAL LOW (ref 49–135)
BUN/Creatinine Ratio: 28 (ref 12–28)
BUN: 28 mg/dL — ABNORMAL HIGH (ref 8–27)
Bilirubin Total: 0.3 mg/dL (ref 0.0–1.2)
CO2: 23 mmol/L (ref 20–29)
Calcium: 10.5 mg/dL — ABNORMAL HIGH (ref 8.7–10.3)
Chloride: 101 mmol/L (ref 96–106)
Creatinine, Ser: 1.01 mg/dL — ABNORMAL HIGH (ref 0.57–1.00)
Globulin, Total: 2.3 g/dL (ref 1.5–4.5)
Glucose: 107 mg/dL — ABNORMAL HIGH (ref 70–99)
Potassium: 4.2 mmol/L (ref 3.5–5.2)
Sodium: 142 mmol/L (ref 134–144)
Total Protein: 7.1 g/dL (ref 6.0–8.5)
eGFR: 62 mL/min/1.73

## 2024-04-15 LAB — LIPID PANEL
Chol/HDL Ratio: 4.2 ratio (ref 0.0–4.4)
Cholesterol, Total: 175 mg/dL (ref 100–199)
HDL: 42 mg/dL
LDL Chol Calc (NIH): 104 mg/dL — ABNORMAL HIGH (ref 0–99)
Triglycerides: 165 mg/dL — ABNORMAL HIGH (ref 0–149)
VLDL Cholesterol Cal: 29 mg/dL (ref 5–40)

## 2024-04-15 NOTE — Progress Notes (Signed)
 Hi Stephanie Perkins, kidney function stable at 1.0.  Calcium  level borderline elevated but not in a worrisome range similar to about a year ago.  Liver function is normal.  Your triglycerides do look better it was 234 4 months ago it is now down to 165 so that is great.  And the LDL came down from 119-104.  Lets continue with the Nexletol 

## 2024-08-12 ENCOUNTER — Ambulatory Visit: Admitting: Family Medicine
